# Patient Record
Sex: Female | Born: 1970 | Race: White | Hispanic: No | Smoking: Current every day smoker
Health system: Southern US, Community
[De-identification: ages and names within clinical notes are randomized; demographics above are authoritative.]

## PROBLEM LIST (undated history)

## (undated) DIAGNOSIS — F429 Obsessive-compulsive disorder, unspecified: Secondary | ICD-10-CM

## (undated) DIAGNOSIS — J45909 Unspecified asthma, uncomplicated: Secondary | ICD-10-CM

## (undated) DIAGNOSIS — Z720 Tobacco use: Secondary | ICD-10-CM

## (undated) DIAGNOSIS — M797 Fibromyalgia: Secondary | ICD-10-CM

## (undated) DIAGNOSIS — G4710 Hypersomnia, unspecified: Secondary | ICD-10-CM

## (undated) DIAGNOSIS — G4730 Sleep apnea, unspecified: Secondary | ICD-10-CM

## (undated) DIAGNOSIS — M810 Age-related osteoporosis without current pathological fracture: Secondary | ICD-10-CM

## (undated) DIAGNOSIS — F419 Anxiety disorder, unspecified: Secondary | ICD-10-CM

## (undated) DIAGNOSIS — J449 Chronic obstructive pulmonary disease, unspecified: Secondary | ICD-10-CM

## (undated) DIAGNOSIS — E669 Obesity, unspecified: Secondary | ICD-10-CM

## (undated) DIAGNOSIS — D510 Vitamin B12 deficiency anemia due to intrinsic factor deficiency: Secondary | ICD-10-CM

## (undated) DIAGNOSIS — K760 Fatty (change of) liver, not elsewhere classified: Secondary | ICD-10-CM

## (undated) DIAGNOSIS — I10 Essential (primary) hypertension: Secondary | ICD-10-CM

## (undated) DIAGNOSIS — F32A Depression, unspecified: Secondary | ICD-10-CM

## (undated) DIAGNOSIS — E785 Hyperlipidemia, unspecified: Secondary | ICD-10-CM

## (undated) HISTORY — DX: Chronic obstructive pulmonary disease, unspecified: J44.9

## (undated) HISTORY — DX: Fatty (change of) liver, not elsewhere classified: K76.0

## (undated) HISTORY — DX: Obsessive-compulsive disorder, unspecified: F42.9

## (undated) HISTORY — DX: Tobacco use: Z72.0

## (undated) HISTORY — DX: Sleep apnea, unspecified: G47.30

## (undated) HISTORY — DX: Obesity, unspecified: E66.9

## (undated) HISTORY — DX: Essential (primary) hypertension: I10

## (undated) HISTORY — PX: KNEE SURGERY: SHX244

## (undated) HISTORY — PX: AUGMENTATION MAMMAPLASTY: SUR837

## (undated) HISTORY — DX: Anxiety disorder, unspecified: F41.9

## (undated) HISTORY — DX: Unspecified asthma, uncomplicated: J45.909

## (undated) HISTORY — DX: Hypersomnia, unspecified: G47.10

## (undated) HISTORY — PX: ABDOMINAL HYSTERECTOMY: SHX81

## (undated) HISTORY — DX: Age-related osteoporosis without current pathological fracture: M81.0

## (undated) HISTORY — PX: FOOT SURGERY: SHX648

## (undated) HISTORY — DX: Depression, unspecified: F32.A

## (undated) HISTORY — DX: Hyperlipidemia, unspecified: E78.5

## (undated) HISTORY — PX: NASAL SEPTUM SURGERY: SHX37

## (undated) HISTORY — DX: Fibromyalgia: M79.7

---

## 1999-04-04 ENCOUNTER — Other Ambulatory Visit: Admission: RE | Admit: 1999-04-04 | Discharge: 1999-04-04 | Payer: Self-pay | Admitting: Obstetrics & Gynecology

## 1999-10-22 ENCOUNTER — Inpatient Hospital Stay (HOSPITAL_COMMUNITY): Admission: AD | Admit: 1999-10-22 | Discharge: 1999-10-22 | Payer: Self-pay | Admitting: Obstetrics & Gynecology

## 1999-11-09 ENCOUNTER — Inpatient Hospital Stay (HOSPITAL_COMMUNITY): Admission: AD | Admit: 1999-11-09 | Discharge: 1999-11-09 | Payer: Self-pay | Admitting: Obstetrics and Gynecology

## 1999-11-13 ENCOUNTER — Inpatient Hospital Stay (HOSPITAL_COMMUNITY): Admission: AD | Admit: 1999-11-13 | Discharge: 1999-11-13 | Payer: Self-pay | Admitting: Obstetrics and Gynecology

## 1999-11-17 ENCOUNTER — Inpatient Hospital Stay (HOSPITAL_COMMUNITY): Admission: AD | Admit: 1999-11-17 | Discharge: 1999-11-19 | Payer: Self-pay | Admitting: Obstetrics and Gynecology

## 1999-12-17 ENCOUNTER — Encounter: Admission: RE | Admit: 1999-12-17 | Discharge: 2000-03-16 | Payer: Self-pay | Admitting: Obstetrics and Gynecology

## 2000-04-26 ENCOUNTER — Emergency Department (HOSPITAL_COMMUNITY): Admission: EM | Admit: 2000-04-26 | Discharge: 2000-04-26 | Payer: Self-pay | Admitting: Emergency Medicine

## 2000-04-26 ENCOUNTER — Encounter: Payer: Self-pay | Admitting: Urology

## 2000-11-08 ENCOUNTER — Other Ambulatory Visit: Admission: RE | Admit: 2000-11-08 | Discharge: 2000-11-08 | Payer: Self-pay | Admitting: Obstetrics & Gynecology

## 2001-03-16 HISTORY — PX: BREAST ENHANCEMENT SURGERY: SHX7

## 2001-12-12 ENCOUNTER — Other Ambulatory Visit: Admission: RE | Admit: 2001-12-12 | Discharge: 2001-12-12 | Payer: Self-pay | Admitting: Obstetrics and Gynecology

## 2002-07-14 ENCOUNTER — Encounter: Payer: Self-pay | Admitting: Specialist

## 2002-07-14 ENCOUNTER — Ambulatory Visit (HOSPITAL_COMMUNITY): Admission: RE | Admit: 2002-07-14 | Discharge: 2002-07-14 | Payer: Self-pay | Admitting: *Deleted

## 2003-02-19 ENCOUNTER — Other Ambulatory Visit: Admission: RE | Admit: 2003-02-19 | Discharge: 2003-02-19 | Payer: Self-pay | Admitting: Obstetrics and Gynecology

## 2004-08-06 ENCOUNTER — Ambulatory Visit: Payer: Self-pay | Admitting: Family Medicine

## 2004-09-29 ENCOUNTER — Other Ambulatory Visit: Admission: RE | Admit: 2004-09-29 | Discharge: 2004-09-29 | Payer: Self-pay | Admitting: Obstetrics and Gynecology

## 2005-02-02 ENCOUNTER — Encounter (INDEPENDENT_AMBULATORY_CARE_PROVIDER_SITE_OTHER): Payer: Self-pay | Admitting: Specialist

## 2005-02-02 ENCOUNTER — Ambulatory Visit (HOSPITAL_COMMUNITY): Admission: RE | Admit: 2005-02-02 | Discharge: 2005-02-02 | Payer: Self-pay | Admitting: Obstetrics and Gynecology

## 2005-08-07 ENCOUNTER — Encounter (INDEPENDENT_AMBULATORY_CARE_PROVIDER_SITE_OTHER): Payer: Self-pay | Admitting: Specialist

## 2005-08-07 ENCOUNTER — Ambulatory Visit (HOSPITAL_COMMUNITY): Admission: RE | Admit: 2005-08-07 | Discharge: 2005-08-07 | Payer: Self-pay | Admitting: Obstetrics and Gynecology

## 2005-08-25 ENCOUNTER — Ambulatory Visit: Payer: Self-pay | Admitting: Family Medicine

## 2006-03-16 HISTORY — PX: OTHER SURGICAL HISTORY: SHX169

## 2006-05-20 ENCOUNTER — Ambulatory Visit: Payer: Self-pay | Admitting: Family Medicine

## 2006-07-26 DIAGNOSIS — F429 Obsessive-compulsive disorder, unspecified: Secondary | ICD-10-CM | POA: Insufficient documentation

## 2006-07-26 DIAGNOSIS — IMO0001 Reserved for inherently not codable concepts without codable children: Secondary | ICD-10-CM | POA: Insufficient documentation

## 2006-08-03 ENCOUNTER — Encounter: Payer: Self-pay | Admitting: Family Medicine

## 2006-08-04 ENCOUNTER — Ambulatory Visit: Payer: Self-pay | Admitting: Family Medicine

## 2006-08-04 DIAGNOSIS — F17200 Nicotine dependence, unspecified, uncomplicated: Secondary | ICD-10-CM | POA: Insufficient documentation

## 2006-08-04 DIAGNOSIS — I10 Essential (primary) hypertension: Secondary | ICD-10-CM | POA: Insufficient documentation

## 2006-08-18 ENCOUNTER — Encounter: Payer: Self-pay | Admitting: Obstetrics & Gynecology

## 2006-08-18 ENCOUNTER — Ambulatory Visit: Payer: Self-pay | Admitting: Obstetrics & Gynecology

## 2006-08-20 ENCOUNTER — Telehealth (INDEPENDENT_AMBULATORY_CARE_PROVIDER_SITE_OTHER): Payer: Self-pay | Admitting: *Deleted

## 2006-08-20 DIAGNOSIS — G4733 Obstructive sleep apnea (adult) (pediatric): Secondary | ICD-10-CM | POA: Insufficient documentation

## 2006-08-24 ENCOUNTER — Ambulatory Visit: Payer: Self-pay | Admitting: Family Medicine

## 2006-09-29 ENCOUNTER — Encounter: Payer: Self-pay | Admitting: Family Medicine

## 2006-11-02 ENCOUNTER — Telehealth (INDEPENDENT_AMBULATORY_CARE_PROVIDER_SITE_OTHER): Payer: Self-pay | Admitting: *Deleted

## 2006-11-10 ENCOUNTER — Ambulatory Visit: Payer: Self-pay | Admitting: Family Medicine

## 2006-11-10 DIAGNOSIS — N924 Excessive bleeding in the premenopausal period: Secondary | ICD-10-CM | POA: Insufficient documentation

## 2006-11-10 DIAGNOSIS — R51 Headache: Secondary | ICD-10-CM

## 2006-11-10 DIAGNOSIS — R519 Headache, unspecified: Secondary | ICD-10-CM | POA: Insufficient documentation

## 2006-12-10 ENCOUNTER — Encounter: Payer: Self-pay | Admitting: Family Medicine

## 2006-12-14 ENCOUNTER — Encounter: Payer: Self-pay | Admitting: Family Medicine

## 2006-12-24 ENCOUNTER — Encounter: Payer: Self-pay | Admitting: Family Medicine

## 2007-05-21 ENCOUNTER — Encounter: Admission: RE | Admit: 2007-05-21 | Discharge: 2007-05-21 | Payer: Self-pay | Admitting: Neurology

## 2007-06-08 ENCOUNTER — Encounter: Payer: Self-pay | Admitting: Family Medicine

## 2007-07-19 ENCOUNTER — Ambulatory Visit: Payer: Self-pay | Admitting: Family Medicine

## 2007-07-26 ENCOUNTER — Ambulatory Visit (HOSPITAL_COMMUNITY): Admission: RE | Admit: 2007-07-26 | Discharge: 2007-07-26 | Payer: Self-pay | Admitting: Gynecology

## 2007-07-29 ENCOUNTER — Ambulatory Visit: Payer: Self-pay | Admitting: Family Medicine

## 2007-08-10 ENCOUNTER — Ambulatory Visit: Payer: Self-pay | Admitting: Family Medicine

## 2007-08-23 ENCOUNTER — Ambulatory Visit: Payer: Self-pay | Admitting: Internal Medicine

## 2007-08-25 ENCOUNTER — Ambulatory Visit (HOSPITAL_BASED_OUTPATIENT_CLINIC_OR_DEPARTMENT_OTHER): Admission: RE | Admit: 2007-08-25 | Discharge: 2007-08-25 | Payer: Self-pay | Admitting: Internal Medicine

## 2007-08-25 ENCOUNTER — Encounter: Payer: Self-pay | Admitting: Internal Medicine

## 2007-09-01 ENCOUNTER — Telehealth: Payer: Self-pay | Admitting: Internal Medicine

## 2007-09-03 ENCOUNTER — Ambulatory Visit: Payer: Self-pay | Admitting: Internal Medicine

## 2007-09-06 ENCOUNTER — Ambulatory Visit: Payer: Self-pay | Admitting: Family Medicine

## 2007-09-06 ENCOUNTER — Encounter: Payer: Self-pay | Admitting: Family Medicine

## 2007-09-09 ENCOUNTER — Ambulatory Visit: Payer: Self-pay | Admitting: Internal Medicine

## 2007-09-15 ENCOUNTER — Telehealth (INDEPENDENT_AMBULATORY_CARE_PROVIDER_SITE_OTHER): Payer: Self-pay | Admitting: *Deleted

## 2007-09-20 ENCOUNTER — Ambulatory Visit (HOSPITAL_COMMUNITY): Admission: RE | Admit: 2007-09-20 | Discharge: 2007-09-20 | Payer: Self-pay | Admitting: Gynecology

## 2007-09-22 ENCOUNTER — Telehealth: Payer: Self-pay | Admitting: Internal Medicine

## 2007-09-26 ENCOUNTER — Ambulatory Visit: Payer: Self-pay | Admitting: Internal Medicine

## 2007-09-26 DIAGNOSIS — G4710 Hypersomnia, unspecified: Secondary | ICD-10-CM | POA: Insufficient documentation

## 2007-09-30 ENCOUNTER — Encounter: Payer: Self-pay | Admitting: Family Medicine

## 2007-10-03 ENCOUNTER — Ambulatory Visit (HOSPITAL_COMMUNITY): Admission: RE | Admit: 2007-10-03 | Discharge: 2007-10-04 | Payer: Self-pay | Admitting: Family Medicine

## 2007-10-03 ENCOUNTER — Encounter: Payer: Self-pay | Admitting: Family Medicine

## 2007-10-03 ENCOUNTER — Ambulatory Visit: Payer: Self-pay | Admitting: Family Medicine

## 2007-10-17 ENCOUNTER — Ambulatory Visit: Payer: Self-pay | Admitting: Family Medicine

## 2007-10-26 ENCOUNTER — Ambulatory Visit: Payer: Self-pay | Admitting: Internal Medicine

## 2007-11-02 ENCOUNTER — Encounter: Payer: Self-pay | Admitting: Internal Medicine

## 2007-12-09 ENCOUNTER — Telehealth: Payer: Self-pay | Admitting: Family Medicine

## 2007-12-28 ENCOUNTER — Telehealth: Payer: Self-pay | Admitting: Internal Medicine

## 2008-01-04 ENCOUNTER — Ambulatory Visit: Payer: Self-pay | Admitting: Family Medicine

## 2008-01-05 ENCOUNTER — Encounter: Payer: Self-pay | Admitting: Family Medicine

## 2008-01-18 ENCOUNTER — Encounter: Payer: Self-pay | Admitting: Family Medicine

## 2008-02-06 ENCOUNTER — Telehealth: Payer: Self-pay | Admitting: Family Medicine

## 2008-03-30 ENCOUNTER — Telehealth: Payer: Self-pay | Admitting: Internal Medicine

## 2008-05-10 ENCOUNTER — Ambulatory Visit: Payer: Self-pay | Admitting: Internal Medicine

## 2008-05-10 DIAGNOSIS — J310 Chronic rhinitis: Secondary | ICD-10-CM | POA: Insufficient documentation

## 2008-05-21 ENCOUNTER — Telehealth (INDEPENDENT_AMBULATORY_CARE_PROVIDER_SITE_OTHER): Payer: Self-pay | Admitting: *Deleted

## 2008-05-23 ENCOUNTER — Telehealth: Payer: Self-pay | Admitting: Internal Medicine

## 2008-06-01 ENCOUNTER — Telehealth: Payer: Self-pay | Admitting: Family Medicine

## 2008-06-01 ENCOUNTER — Encounter: Payer: Self-pay | Admitting: Family Medicine

## 2008-07-06 ENCOUNTER — Telehealth: Payer: Self-pay | Admitting: Internal Medicine

## 2008-08-09 ENCOUNTER — Telehealth: Payer: Self-pay | Admitting: Internal Medicine

## 2008-08-10 ENCOUNTER — Telehealth: Payer: Self-pay | Admitting: Family Medicine

## 2008-08-15 ENCOUNTER — Ambulatory Visit: Payer: Self-pay | Admitting: Family Medicine

## 2008-10-03 ENCOUNTER — Telehealth: Payer: Self-pay | Admitting: Family Medicine

## 2008-10-03 ENCOUNTER — Encounter: Payer: Self-pay | Admitting: Family Medicine

## 2008-10-08 ENCOUNTER — Telehealth: Payer: Self-pay | Admitting: Family Medicine

## 2008-10-09 ENCOUNTER — Encounter: Payer: Self-pay | Admitting: Family Medicine

## 2008-11-12 ENCOUNTER — Telehealth: Payer: Self-pay | Admitting: Internal Medicine

## 2009-01-10 ENCOUNTER — Telehealth: Payer: Self-pay | Admitting: Internal Medicine

## 2009-04-05 ENCOUNTER — Telehealth: Payer: Self-pay | Admitting: Internal Medicine

## 2009-06-07 ENCOUNTER — Telehealth: Payer: Self-pay | Admitting: Internal Medicine

## 2009-07-11 ENCOUNTER — Telehealth (INDEPENDENT_AMBULATORY_CARE_PROVIDER_SITE_OTHER): Payer: Self-pay | Admitting: *Deleted

## 2009-08-16 ENCOUNTER — Ambulatory Visit: Payer: Self-pay | Admitting: Internal Medicine

## 2009-08-19 ENCOUNTER — Telehealth (INDEPENDENT_AMBULATORY_CARE_PROVIDER_SITE_OTHER): Payer: Self-pay | Admitting: *Deleted

## 2009-08-28 ENCOUNTER — Encounter: Payer: Self-pay | Admitting: Family Medicine

## 2009-09-09 ENCOUNTER — Telehealth: Payer: Self-pay | Admitting: Family Medicine

## 2009-09-10 ENCOUNTER — Telehealth: Payer: Self-pay | Admitting: Family Medicine

## 2009-09-19 ENCOUNTER — Telehealth (INDEPENDENT_AMBULATORY_CARE_PROVIDER_SITE_OTHER): Payer: Self-pay | Admitting: *Deleted

## 2009-10-08 ENCOUNTER — Encounter: Payer: Self-pay | Admitting: Family Medicine

## 2009-10-09 ENCOUNTER — Ambulatory Visit: Payer: Self-pay | Admitting: Family Medicine

## 2009-10-09 DIAGNOSIS — R7303 Prediabetes: Secondary | ICD-10-CM | POA: Insufficient documentation

## 2009-10-09 DIAGNOSIS — E785 Hyperlipidemia, unspecified: Secondary | ICD-10-CM | POA: Insufficient documentation

## 2009-10-09 DIAGNOSIS — E538 Deficiency of other specified B group vitamins: Secondary | ICD-10-CM | POA: Insufficient documentation

## 2009-11-01 ENCOUNTER — Telehealth (INDEPENDENT_AMBULATORY_CARE_PROVIDER_SITE_OTHER): Payer: Self-pay | Admitting: *Deleted

## 2009-12-11 ENCOUNTER — Telehealth (INDEPENDENT_AMBULATORY_CARE_PROVIDER_SITE_OTHER): Payer: Self-pay | Admitting: *Deleted

## 2010-01-09 ENCOUNTER — Telehealth (INDEPENDENT_AMBULATORY_CARE_PROVIDER_SITE_OTHER): Payer: Self-pay | Admitting: *Deleted

## 2010-01-16 ENCOUNTER — Telehealth: Payer: Self-pay | Admitting: Internal Medicine

## 2010-01-21 ENCOUNTER — Telehealth (INDEPENDENT_AMBULATORY_CARE_PROVIDER_SITE_OTHER): Payer: Self-pay | Admitting: *Deleted

## 2010-03-06 ENCOUNTER — Telehealth (INDEPENDENT_AMBULATORY_CARE_PROVIDER_SITE_OTHER): Payer: Self-pay | Admitting: *Deleted

## 2010-04-15 NOTE — Progress Notes (Signed)
Summary: prescript  Phone Note Call from Patient Call back at [phone]   Caller: Patient Call For: young Summary of Call: need prescript for generic adderral and ritalin pt will pick up when ready. Initial call taken by: Rickard Patience,  November 01, 2009 10:22 AM  Follow-up for Phone Call        [facility] need to know if pt has enough to get through to Monday when CY returns. Zackery Barefoot CMA  November 01, 2009 10:26 AM    LMTCB.Reynaldo Minium CMA  November 01, 2009 4:26 PM   Additional Follow-up for Phone Call Additional follow up Details #1::        rxs placed on Dr Roxy Cedar cart to be signed Vernie Murders  November 04, 2009 5:14 PM    LMOVM that RX is ready for pick up.Reynaldo Minium CMA  November 05, 2009 12:13 PM     Prescriptions: METHYLPHENIDATE HCL 10 MG TABS (METHYLPHENIDATE HCL) 2 daily as needed  #60 x 0   Entered by:   Vernie Murders   Authorized by:   Waymon Budge MD   Signed by:   Vernie Murders on 11/04/2009   Method used:   Print then Give to Patient   RxID:   [id number] AMPHETAMINE-DEXTROAMPHETAMINE 30 MG XR24H-CAP (AMPHETAMINE-DEXTROAMPHETAMINE) 1 daily as needed  #30 x 0   Entered by:   Vernie Murders   Authorized by:   Waymon Budge MD   Signed by:   Vernie Murders on 11/04/2009   Method used:   Print then Give to Patient   RxID:   [id number]

## 2010-04-15 NOTE — Progress Notes (Signed)
Summary: refills  Phone Note Call from Patient Call back at [phone]   Caller: Patient Call For: young Reason for Call: Refill Medication Summary of Call: Need written rxs for amphetamine-dextroamphetamine 30mg , methylphenidate hcl 10mg . Initial call taken by: Darletta Moll,  September 19, 2009 2:33 PM  Follow-up for Phone Call        Rxs printed and placed on CY's cart to sign.  Will forward message to him as FYI.  Gweneth Dimitri RN  September 19, 2009 2:38 PM   Additional Follow-up for Phone Call Additional follow up Details #1::        Done Additional Follow-up by: Waymon Budge MD,  September 19, 2009 5:46 PM    Additional Follow-up for Phone Call Additional follow up Details #2::    Pt aware RX's ready and will pick up this afternoon.Reynaldo Minium CMA  September 20, 2009 9:40 AM   Prescriptions: METHYLPHENIDATE HCL 10 MG TABS (METHYLPHENIDATE HCL) 2 daily as needed  #60 x 0   Entered by:   Gweneth Dimitri RN   Authorized by:   Waymon Budge MD   Signed by:   Gweneth Dimitri RN on 09/19/2009   Method used:   Print then Give to Patient   RxID:   [id number] AMPHETAMINE-DEXTROAMPHETAMINE 30 MG XR24H-CAP (AMPHETAMINE-DEXTROAMPHETAMINE) 1 daily as needed  #31 x 0   Entered by:   Gweneth Dimitri RN   Authorized by:   Waymon Budge MD   Signed by:   Gweneth Dimitri RN on 09/19/2009   Method used:   Print then Give to Patient   RxID:   [id number]

## 2010-04-15 NOTE — Progress Notes (Signed)
Summary: rx request  Phone Note Call from Patient   Caller: Patient Call For: young Summary of Call: pt requests to pick up rx for adderall. note: she has a f/u appt w/ cy sched for nex tues. [phone] Initial call taken by: Tivis Ringer, CNA,  June 07, 2009 9:24 AM  Follow-up for Phone Call        pt called back to check and see if rx was ready. Follow-up by: Eugene Gavia,  June 07, 2009 3:55 PM  Additional Follow-up for Phone Call Additional follow up Details #1::        rx printed and given to CY to sign. pt advised can pick up later this pm. Carron Curie CMA  June 07, 2009 4:10 PM     Prescriptions: AMPHETAMINE-DEXTROAMPHETAMINE 30 MG XR24H-CAP (AMPHETAMINE-DEXTROAMPHETAMINE) 1 or 2 daily as needed  #60 x 0   Entered by:   Carron Curie CMA   Authorized by:   Waymon Budge MD   Signed by:   Carron Curie CMA on 06/07/2009   Method used:   Print then Give to Patient   RxID:   [id number]

## 2010-04-15 NOTE — Progress Notes (Signed)
Summary: adderall  Phone Note Call from Patient   Caller: Patient Call For: YOUNG Summary of Call: pt requests rx for adderall. she will pick this up when called. [phone] Initial call taken by: Tivis Ringer, CNA,  July 11, 2009 2:41 PM  Follow-up for Phone Call        Pt has appt sched with Dr Maple Hudson for 08/16/09.  Rx printed and placed in lookat  Follow-up by: Vernie Murders,  July 11, 2009 2:46 PM  Additional Follow-up for Phone Call Additional follow up Details #1::        Rx signed and placed at front for pick up; Left message at pts number that it was ready to pick up.Reynaldo Minium CMA  July 11, 2009 3:13 PM     Prescriptions: AMPHETAMINE-DEXTROAMPHETAMINE 30 MG XR24H-CAP (AMPHETAMINE-DEXTROAMPHETAMINE) 1 or 2 daily as needed  #60 x 0   Entered by:   Vernie Murders   Authorized by:   Waymon Budge MD   Signed by:   Vernie Murders on 07/11/2009   Method used:   Print then Give to Patient   RxID:   [id number]

## 2010-04-15 NOTE — Progress Notes (Signed)
Summary: rx adderall  Phone Note Call from Patient   Caller: Patient Call For: Elizer Bostic Summary of Call: pt requests a rx printed out for adderall. call pt at [phone]. she will then have her friend/ co-worker sheila (from [facility]) pick this up for her. [phone] Initial call taken by: Tivis Ringer,  January 10, 2009 2:46 PM  Follow-up for Phone Call        rx printed and placed on CY look-at cart to sign. Will notify pt once signed. Carron Curie CMA  January 10, 2009 2:58 PM    lmom for pt to make her aware the rx was ready to be picked up.  told her to call for any questions. Marijo File CMA  January 10, 2009 3:36 PM     Prescriptions: AMPHETAMINE-DEXTROAMPHETAMINE 30 MG XR24H-CAP (AMPHETAMINE-DEXTROAMPHETAMINE) 1 or 2 daily as needed  #60 x 0   Entered by:   Carron Curie CMA   Authorized by:   Waymon Budge MD   Signed by:   Carron Curie CMA on 01/10/2009   Method used:   Print then Give to Patient   RxID:   [id number]

## 2010-04-15 NOTE — Progress Notes (Signed)
Summary: rx's / adderall and ritalin -  Phone Note Call from Patient   Caller: Patient Call For: young Summary of Call: pt has questions re: ritalin and adderall. she wants a rx for each with "extended release for both. [phone] Initial call taken by: Tivis Ringer, CNA,  January 16, 2010 11:53 AM  Follow-up for Phone Call        lmomtcb Vernie Murders  January 16, 2010 11:59 AM  [facility] TCB x2. Boone Master CNA/MA  January 17, 2010 10:21 AM    LMOMTCB  Vernie Murders  January 20, 2010 4:31 PM   Additional Follow-up for Phone Call Additional follow up Details #1::        per protocol will sign offon note and await return call. Carron Curie CMA  January 21, 2010 11:31 AM

## 2010-04-15 NOTE — Assessment & Plan Note (Signed)
Summary: Fernand Parkins ///KP   Primary Provider/Referring Provider:  Roxy Manns  CC:  follow up visit.  History of Present Illness: 10/26/07 40 year old woman returning for follow-up of hypersomnia, and history of snoring.  She continues to smoke and refers to her history of OCD.  Hysterectomy 3 weeks ago without problems.  She skipped Nuvigil and Ritalin for two weeks related to anesthesia and surgery, but has started back on those medicines.  Ritalin 20-mg SR b.i.d., plus Nuvigil 250 mg most days.  This has been a good combination.  She does not need naps when she gets home from work, so she can spend time with her son and get her own work done.  She is also pleased with weight loss because of her diabetes.  We discussed these medicines carefully including issues of stimulant medication, weight loss, and mood change. new porblem sinus congestion, pressure drainage.  05/10/08- Snoring, Hypersomnia Still leaves work at [employer], goes home and sleeps until 9 PM, is up for an hour or so until 11PM then goes back to bed. Dr Clarisse Gouge for migraine, sometimes prolonged. Being eval for R/O MS. Uses Nuvigil- she likes it.. Uses Ritalin occasionally- less effective. Says generic definitely less effective than brand name . She tolerates Ritalin and Nuvigil well without recognized concerns.  Discussed social stresses with family. Also takes some Neuronitn and Skelaxin which she understands are sedating.  August 16, 2009- Snoring, Hypersomnia She quit Nuvigil because of cost, but liked it. She uses Adderall once or twice daily- twice if she works a double shift, and thinks she does really well with it. She now sleeps fine as long as she is careful to avoid taking adderall too late. She is satisfied. Denies headache, chest pain, palpitation.    Current Medications (verified): 1)  Neurontin 600 Mg Tabs (Gabapentin) .... Take 1 Tablet By Mouth Two Times A Day For Leg Pain 2)  Cymbalta  Cpep (Duloxetine Hcl Cpep) .... Take 90  Mg. By Mouth Daily 3)  Toprol Xl 100 Mg Xr24h-Tab (Metoprolol Succinate) .Marland Kitchen.. 1 By Mouth Once Daily 4)  Topamax 100 Mg  Tabs (Topiramate) .Marland Kitchen.. 1 At Bedtime 5)  Amphetamine-Dextroamphetamine 30 Mg Xr24h-Cap (Amphetamine-Dextroamphetamine) .Marland Kitchen.. 1 or 2 Daily As Needed 6)  Doxycycline Hyclate 100 Mg Tabs (Doxycycline Hyclate) .... Take 1 Tablet By Mouth Two Times A Day (For Acne) 7)  Amrix 15 Mg Xr24h-Cap (Cyclobenzaprine Hcl) .... 2 By Mouth Every Day 8)  Norco 10-325 Mg Tabs (Hydrocodone-Acetaminophen) .... One By Mouth Every 6 Hours 9)  Zestril 5 Mg Tabs (Lisinopril) .Marland Kitchen.. 1 By Mouth Once Daily in Am  Allergies (verified): 1)  ! Chantix Continuing Month Pak (Varenicline Tartrate) 2)  Cipro 3)  Levaquin  Past History:  Past Medical History: Last updated: 05/10/2008 DM 2 obesity tobacco abuse OCD fibromyalgia ? hyperlipidemia  Hypersomnia- NPSG 08/25/07- 1.2/hr  Past Surgical History: Last updated: 11/10/2006 Foot surgery (2005) Deviated septum surgery foot sx 2008 IUD merina 2008  Family History: Last updated: 08/23/2007 Father: HTN, CAD, chol, OSA with cpap Mother: ? 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    Vital Signs:  Patient profile:   40 year old female Height:      64 inches Weight:      170 pounds BMI:      29.29 Cuff size:   regular  Vitals Entered By: Reynaldo Minium CMA (August 16, 2009 3:58 PM)  O2 Flow:  Room air  Physical Exam  Additional Exam:  General: A/Ox3; pleasant and cooperative, NAD,overweight, affect appropriate, not obviously sleepy SKIN: no rash, lesions NODES: no lymphadenopathy NECK: Supple w/ fair ROM, JVD- none, normal carotid impulses w/o bruits Thyroid- normal to palpation CHEST: Clear to P&A HEART: RRR, no m/g/r heard ABDOMEN:  NWG:NFAO, nl pulses, no edema  NEURO: Grossly intact to observation, no tremor HEENT: [place]/AT, EOM- wnl, Conjunctivae- clear, PERRLA, TMs- wnl, Nose- clear, Throat- clear and wnl       Impression & Recommendations:  Problem # 1:  HYPERSOMNIA (ICD-780.54)  Doing very well. Sleep hygiene is better and she is pleased with her quality of life. Adderall is a big help without associated problems now that she has learned how to use it. We have reviewed the controlled status of adderall and the side effect concerns. She has expressed understanding.  Problem # 2:  RHINITIS (ICD-472.0)  Controlled with no interference with her sleep now.  Medications Added to Medication List This Visit: 1)  Nuvigil 250 Mg Tabs (Armodafinil) .Marland Kitchen.. 1 daily if needed  Other Orders: Est. Patient Level III (13086)  Patient Instructions: 1)  Please schedule a follow-up appointment in 1 year. 2)  Refill script for Nuvigil with discount card so you can check price. Don't take it with adderall. 3)  Refilled Adderall Prescriptions: NUVIGIL 250 MG TABS (ARMODAFINIL) 1 daily if needed  #30 x 5   Entered and Authorized by:   Waymon Budge MD   Signed by:   Waymon Budge MD on 08/16/2009   Method used:   Print then Give to Patient   RxID:   [id number] AMPHETAMINE-DEXTROAMPHETAMINE 30 MG XR24H-CAP (AMPHETAMINE-DEXTROAMPHETAMINE) 1 or 2 daily as needed  #60 x 0   Entered and Authorized by:   Waymon Budge MD   Signed by:   Waymon Budge MD on  08/16/2009   Method used:   Print then Give to Patient   RxID:   [id number]

## 2010-04-15 NOTE — Progress Notes (Signed)
Summary: rx request  Phone Note Call from Patient   Caller: Patient Call For: young Summary of Call: pt requests rx's for adderall and ritalin. will pick up. [phone] Initial call taken by: Tivis Ringer, CNA,  December 11, 2009 11:53 AM  Follow-up for Phone Call        Last OV 6.3.11, Pending OV 6.1.12 Rxs printed and placed on CY's cart for signature Pt will pick up when ready Gweneth Dimitri RN  December 11, 2009 11:57 AM   Additional Follow-up for Phone Call Additional follow up Details #1::        Rx at front desk for pick up.Reynaldo Minium CMA  December 11, 2009 12:31 PM    Pt aware rxs at front for pick up.  Gweneth Dimitri RN  December 11, 2009 12:40 PM     Prescriptions: METHYLPHENIDATE HCL 10 MG TABS (METHYLPHENIDATE HCL) 2 daily as needed  #60 x 0   Entered by:   Gweneth Dimitri RN   Authorized by:   Waymon Budge MD   Signed by:   Gweneth Dimitri RN on 12/11/2009   Method used:   Print then Give to Patient   RxID:   [id number] AMPHETAMINE-DEXTROAMPHETAMINE 30 MG XR24H-CAP (AMPHETAMINE-DEXTROAMPHETAMINE) 1 daily as needed  #30 x 0   Entered by:   Gweneth Dimitri RN   Authorized by:   Waymon Budge MD   Signed by:   Gweneth Dimitri RN on 12/11/2009   Method used:   Print then Give to Patient   RxID:   [id number]

## 2010-04-15 NOTE — Progress Notes (Signed)
Summary: B-12 injections  Phone Note Call from Patient Call back at Cell:  [phone]   Caller: Patient Call For: Judith Part MD Summary of Call: Concerning the B-12 injections from phone note yesterday, Rylee wants to know if this can be phoned in to the pharmacy and she will do her own injections.  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  She says she will come here if she has to but she would have to pay a $25 copay each time.  I told her I didn't know if this was admissable or not.  Please advise. Initial call taken by: Delilah Shan CMA Duncan Dull),  September 10, 2009 11:16 AM  Follow-up for Phone Call        if she is trained to do injections -- make sure of that - is ok  px written on EMR for call in  Follow-up by: Judith Part MD,  September 10, 2009 11:30 AM  Additional Follow-up for Phone Call Additional follow up Details #1::        Left message for patient to call back. Lewanda Rife LPN  September 10, 2009 12:43 PM   Spoke w/ patient. She is trained to do injections IM. Notified of rx.  Melody Comas  September 10, 2009 2:39 PM  Additional Follow-up by: Melody Comas,  September 10, 2009 2:39 PM    New/Updated Medications: CYANOCOBALAMIN 1000 MCG/ML SOLN (CYANOCOBALAMIN) inject 1 ml IM as directed once weekly for 4 weeks Prescriptions: CYANOCOBALAMIN 1000 MCG/ML SOLN (CYANOCOBALAMIN) inject 1 ml IM as directed once weekly for 4 weeks  #50ml x 0   Entered and Authorized by:   Judith Part MD   Signed by:   Lewanda Rife LPN on 16/12/9602   Method used:   Telephoned to ...       [facility]  [place] (retail)       [address]       [place], [place]  [address]       Ph: [phone] or [phone]       Fax: [phone]   RxID:   [id number]

## 2010-04-15 NOTE — Progress Notes (Signed)
Summary: prescript  Phone Note Call from Patient Call back at [phone]   Caller: Patient Call For: Caysie Minnifield Summary of Call: need prescript for adderrall will pick up Initial call taken by: Rickard Patience,  April 05, 2009 3:54 PM  Follow-up for Phone Call        Left message on voicemail at given number that RX is ready to pick up.Reynaldo Minium CMA  April 05, 2009 4:13 PM     Prescriptions: AMPHETAMINE-DEXTROAMPHETAMINE 30 MG XR24H-CAP (AMPHETAMINE-DEXTROAMPHETAMINE) 1 or 2 daily as needed  #60 x 0   Entered by:   Vernie Murders   Authorized by:   Waymon Budge MD   Signed by:   Vernie Murders on 04/05/2009   Method used:   Print then Give to Patient   RxID:   [id number]

## 2010-04-15 NOTE — Assessment & Plan Note (Signed)
Summary: F/U B-12/CLE   Vital Signs:  Patient profile:   40 year old female Height:      64 inches Weight:      164.50 pounds BMI:     28.34 Temp:     99.5 degrees F oral Pulse rate:   92 / minute Pulse rhythm:   regular BP sitting:   136 / 90  (left arm) Cuff size:   regular  Vitals Entered By: Lewanda Rife LPN (October 09, 2009 4:30 PM)  Serial Vital Signs/Assessments:  Time      Position  BP       Pulse  Resp  Temp     By                     138/92                         Judith Part MD  CC: follow-up visit   History of Present Illness: here for f/u of B12 def   when first checked level was below 150 then after 4 weekly B12 shots is up t o570 does not use a huge difference in the way she feels - perhaps a litle imp in concentration   had been taking oral supplementation  gives herself own shots   LDL chol was 172 thinks her diet is very low sat fat  rarely eats a fried food / red meat every 3 months / egg yolks very rarely / does eat a fair amt of mayo/  no shellfish / not a lot of cheese or butter /  high chol runs on mother's side of family    is having hot flashes all the time   no stomach problems or ppis  mother has low B12 - aplastic anemia  neg pariatal cell ab   was taking oral B 12 several weeks and it did not help   had partial hyst - and it went very well   AIC was 4.9     Allergies: 1)  ! Chantix Continuing Month Pak (Varenicline Tartrate) 2)  Cipro 3)  Levaquin  Past History:  Past Medical History: Last updated: 05/10/2008 DM 2 obesity tobacco abuse OCD fibromyalgia ? hyperlipidemia  Hypersomnia- NPSG 08/25/07- 1.2/hr  Past Surgical History: Last updated: 11/10/2006 Foot surgery (2005) Deviated septum surgery foot sx 2008 IUD merina 2008  Family History: Last updated: 08/23/2007 Father: HTN, CAD, chol, OSA with cpap Mother: ? 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 Eyes:  Denies blurring and eye irritation. CV:  Denies chest pain or discomfort, lightheadness, palpitations, and shortness of breath with exertion. Resp:  Denies cough, pleuritic, and wheezing. GI:  Denies abdominal pain, change in bowel habits, and diarrhea. MS:  Denies joint swelling and muscle weakness. Derm:  Denies itching, lesion(s), poor wound healing, and rash. Neuro:  Denies numbness and tingling. Psych:  Denies anxiety and depression. Endo:  Denies cold intolerance, excessive thirst, excessive urination, and heat intolerance. Heme:  Denies abnormal bruising and bleeding.  Physical Exam  General:  overweight but generally well appearing  Head:  normocephalic, atraumatic, and no abnormalities observed.   Eyes:  vision grossly intact, pupils equal, pupils round, and pupils reactive to light.  no conjunctival pallor, injection or icterus  Mouth:  pharynx pink and moist.   Neck:  supple with full rom and no masses or thyromegally, no JVD or carotid bruit  Chest Wall:  No deformities, masses, or tenderness noted. Lungs:  diffusely distant bs , with no crackles or rales scant wheeze on forced exp Heart:  Normal rate and regular rhythm. S1 and S2 normal without gallop, murmur, click, rub or other extra sounds. Abdomen:  Bowel sounds positive,abdomen soft and non-tender without masses, organomegaly or hernias noted. no renal bruits  Msk:  No deformity or scoliosis noted of thoracic or lumbar spine.  no acute joint changes Pulses:  R and L carotid,radial,femoral,dorsalis pedis  and posterior tibial pulses are full and equal bilaterally Extremities:  No clubbing, cyanosis, edema, or deformity noted with normal full range of motion of all joints.   Neurologic:  sensation intact to light touch, gait normal, and DTRs symmetrical and normal.   Skin:  Intact without suspicious lesions or rashes tanned with lentigos  Cervical Nodes:  No lymphadenopathy noted Inguinal Nodes:  No significant adenopathy Psych:  normal affect, talkative and pleasant - seems generally fatigued    Impression & Recommendations:  Problem # 1:  VITAMIN B12 DEFICIENCY (ICD-266.2) Assessment New  with much imp after injections (after failing oral tx)  plan to continue shots monthly  stick to balanced diet  lab 6 mo (at work) and then f/u   Orders: Administrator, arts [phone])  Problem # 2:  HYPERGLYCEMIA (ICD-790.29) Assessment: Improved  this is pretty much resolved with better diet- AIC is 4.9 urged to watch sugar in diet  Orders: Prescription Created Electronically [phone])  Problem # 3:  HYPERTENSION, ESSENTIAL NOS (ICD-401.9) Assessment: Deteriorated  non tolerant of zestril  will try cozaar and keep check of bp  lab reviewed f/u 6 mo  The following medications were removed from the medication list:    Zestril 5 Mg Tabs (Lisinopril) .Marland Kitchen... 1 by mouth once daily in am Her updated medication list for this problem includes:    Toprol Xl 100 Mg Xr24h-tab (Metoprolol succinate) .Marland Kitchen... 1 by mouth once daily    Cozaar 50 Mg Tabs (Losartan potassium) .Marland Kitchen... 1 by mouth once daily  Orders: Prescription Created Electronically [phone])  Problem # 4:  HYPERLIPIDEMIA (ICD-272.4) Assessment: New  with fair diet- LDL in 170s will watch diet closely- and change to non fat mayo  re check 6 mo at work then f/u disc risks of high chol   Orders: Prescription Created Electronically [phone])  Complete Medication List: 1)  Neurontin 600 Mg Tabs (Gabapentin) .... Take 1  tablet by mouth two times a day for leg pain 2)  Cymbalta Cpep (Duloxetine hcl cpep) .... Take 90 mg. by mouth daily 3)  Toprol Xl 100 Mg Xr24h-tab (Metoprolol succinate) .Marland Kitchen.. 1 by mouth once daily 4)  Topamax 100 Mg Tabs (Topiramate) .Marland Kitchen.. 1 at bedtime 5)  Doxycycline Hyclate 100 Mg Tabs (Doxycycline hyclate) .... Take 1 tablet by mouth two times a day (for acne) 6)  Amrix 15 Mg Xr24h-cap (Cyclobenzaprine hcl) .... 2 by mouth every day 7)  Norco 10-325 Mg Tabs (Hydrocodone-acetaminophen) .... One by mouth every 4  hours 8)  Amphetamine-dextroamphetamine 30 Mg Xr24h-cap (Amphetamine-dextroamphetamine) .Marland Kitchen.. 1 daily as needed 9)  Methylphenidate Hcl 10 Mg  Tabs (Methylphenidate hcl) .... 2 daily as needed 10)  Cyanocobalamin 1000 Mcg/ml Soln (Cyanocobalamin) .... Inject 1 ml im as directed once monthly 11)  Cozaar 50 Mg Tabs (Losartan potassium) .Marland Kitchen.. 1 by mouth once daily  Patient Instructions: 1)  stay with vitamin B12 shots once monthly  2)  start cozaar 50 mg daily - instead of the lisinopril 3)  this should not cause cough - but if any side effects at all let me know 4)  switch to non fat mayo  5)  you can raise your HDL (good cholesterol) by increasing exercise and eating omega 3 fatty acid supplement like fish oil or flax seed oil over the counter 6)  you can lower LDL (bad cholesterol) by limiting saturated fats in diet like red meat, fried foods, egg yolks, fatty breakfast meats, high fat dairy products and shellfish  7)  check fasting labs in 6 months B12, lipid/ast/alt - and then follow up  Prescriptions: CYANOCOBALAMIN 1000 MCG/ML SOLN (CYANOCOBALAMIN) inject 1 ml IM as directed once monthly  #3 doses x 3   Entered and Authorized by:   Judith Part MD   Signed by:   Judith Part MD on 10/09/2009   Method used:   Electronically to        [facility] [phone]* (retail)       [address]       [address]       [place], [place]  [address]       Ph: [phone]        Fax: [phone]   RxID:   [id number] COZAAR 50 MG TABS (LOSARTAN POTASSIUM) 1 by mouth once daily  #90 x 3   Entered and Authorized by:   Judith Part MD   Signed by:   Judith Part MD on 10/09/2009   Method used:   Electronically to        [facility] [phone]* (retail)       [address]       [address]       [place], [place]  [address]       Ph: [phone]       Fax: [phone]   RxID:   [id number]   Current Allergies (reviewed today): ! CHANTIX CONTINUING MONTH PAK (VARENICLINE TARTRATE) CIPRO LEVAQUIN

## 2010-04-15 NOTE — Progress Notes (Signed)
Summary: QUESTION REGARD BP MEDS  Phone Note Call from Patient   Summary of Call: PT CALLED...SAYS PHARMACY HAS QUESTIONS REGARDING HER BLOOD PRESSURE MEDICATION.  THEY WILL NOT FILL.Marland Kitchen[facility] KitchenPHARMACY [facility]  CALL BACK # [phone].Daine Gip  February 06, 2008 4:59 PM Initial call taken by: Daine Gip,  February 06, 2008 4:59 PM  Follow-up for Phone Call        spoke with pharmacy pt wants metoprolol changed to er once daily b/c taking it  two times daily makes her sleepy Liane Comber  February 06, 2008 5:07 PM  can switch to ER - may cost more  do not know if it will help sleepiness or not  I will put on EMR for 100 mg ER follow up in about 1 mo for bp check on this  if dizzy or low pulse rate under 60- need to stop it and update me    Follow-up by: Judith Part MD,  February 06, 2008 9:03 PM  Additional Follow-up for Phone Call Additional follow up Details #1::        Left message on answering machine. ...............................................Marland KitchenNatasha Chavers February 07, 2008 10:01 AM     New/Updated Medications: TOPROL XL 100 MG XR24H-TAB (METOPROLOL SUCCINATE) 1 by mouth once daily   Prescriptions: TOPROL XL 100 MG XR24H-TAB (METOPROLOL SUCCINATE) 1 by mouth once daily  #30 x 2   Entered and Authorized by:   Judith Part MD   Signed by:   Liane Comber on 02/07/2008   Method used:   Telephoned to ...       [facility] [address] [facility]ail[facility]       [address]       [place], [place]  [address]       Ph: [phone] or [phone]       Fax: [phone]   RxID:   [id number]

## 2010-04-15 NOTE — Progress Notes (Signed)
Summary: Nuvigil PA---member to contact member svcs - LMTCBx3  Phone Note Outgoing Call   Call placed by: Michel Bickers CMA,  January 09, 2010 9:18 AM Call placed to: Patient Summary of Call: Received PA request from [facility] on [place] for Nuvigil. Pt's ins co. is Monia Pouch [phone]) and they say pt's ins termed out on 12/13/2009 and the patient will need to contact member svcs at [phone]. We cannot do the PA for Nuvigil until this has been taken care of. LMOMTCB.  Member ID # [id number] Initial call taken by: Michel Bickers CMA,  January 09, 2010 9:22 AM  Follow-up for Phone Call        [facility] TCB x1. Boone Master CNA/MA  January 10, 2010 4:32 PM   LMTCBx2.Carron Curie CMA  January 14, 2010 10:17 AM  [facility] @ [phone] TCBx1.  pt insurance termed out as of 9.30.11.  she will need to contact her insurance company in order for PA/coverage for nuvigil. Boone Master CNA/MA  January 16, 2010 9:43 AM

## 2010-04-15 NOTE — Progress Notes (Signed)
Summary: ritalin/ Adderall refill-LMTCbx2  Phone Note Call from Patient   Caller: Patient Call For: young Summary of Call: pt requests to speak to nurse re: last msg 01/16/10- pt is now returning call (ritalin rx) [phone] Initial call taken by: Tivis Ringer, CNA,  January 21, 2010 4:52 PM  Follow-up for Phone Call        Spoke with pt.  She states that she is working 3rd shift now and having a really hard time with staying awake at work. She is requesting that her ritalin be changed to ritalin ER to see if this helps.  Pls advise thanks! Follow-up by: Vernie Murders,  January 21, 2010 4:56 PM  Additional Follow-up for Phone Call Additional follow up Details #1::        If Ritalin worked as a morning med before, I would first want to know if she is taking it now timed appropriately to kick in for her work shift- maybe an hour before starting work. If so, and it helps but is just earing off before shift ends, then it would make sense to try a longer acting product.  Additional Follow-up by: Waymon Budge MD,  January 22, 2010 1:54 PM    Additional Follow-up for Phone Call Additional follow up Details #2::    Pt states she has not had any ritalin or adderall since she has started 3rd shift, so she states she would like to try regular ritalin to see if it helps. She states she is just very tired in the mornigns and falls asleep on her way home. Should pt be taking adderall and ritalin? Please advise.Carron Curie CMA  January 22, 2010 2:43 PM   Additional Follow-up for Phone Call Additional follow up Details #3:: Details for Additional Follow-up Action Taken: I may need to see her much sooner than scheduled. Everybody has more trouble with 3rd shift work.  She has script for adderall 30 mg XR and for methylphenidate (ritalin) regular acting. We will refill those and if they aren't working for her then I would like to see her here to work out options.  Additional Follow-up by:  Waymon Budge MD,  January 23, 2010 12:50 PM  Prescriptions: METHYLPHENIDATE HCL 10 MG TABS (METHYLPHENIDATE HCL) 2 daily as needed  #60 x 0   Entered by:   Waymon Budge MD   Authorized by:   Pulmonary Triage   Signed by:   Waymon Budge MD on 01/23/2010   Method used:   Print then Give to Patient   RxID:   [id number] AMPHETAMINE-DEXTROAMPHETAMINE 30 MG XR24H-CAP (AMPHETAMINE-DEXTROAMPHETAMINE) 1 daily as needed  #30 x 0   Entered by:   Waymon Budge MD   Authorized by:   Pulmonary Triage   Signed by:   Waymon Budge MD on 01/23/2010   Method used:   Print then Give to Patient   RxID:   [id number]  LMOMTCB x1.  RX to be left at front desk for pick up.( RX is now at triage desk ) Abigail Miyamoto RN  January 23, 2010 12:55 PM   LMTCBx2 to see if pt wants rx mailed or will she pick-up? rx is in triage. Carron Curie CMA  January 23, 2010 2:38 PM    Spoke with patient-she is aware of CDY's recs and states she will come by in am to pick up Rx's.Reynaldo Minium CMA  January 23, 2010 5:29 PM

## 2010-04-15 NOTE — Progress Notes (Signed)
Summary: adderall & ritalin  Phone Note Call from Patient Call back at [phone]   Caller: Patient Call For: young Reason for Call: Talk to Nurse Summary of Call: Ins. company giving pt hard time about Adderall two times a day - Will pay for it once per day.  Could she do Adderall once a day and Ritalin once per day.  Would this be ok w/you? Initial call taken by: Eugene Gavia,  August 19, 2009 3:15 PM  Follow-up for Phone Call        Pt states insurance will not pay for adderall twice a day, but pt was told she could do adderall for half the day and then ritalin for half the day and this would be covered. Pt watns to know if this is an option with you. She states they did not specify a dosage.  She states she will need new rx for adderall and ritalin if this is ok with CY. Pt will pick up rx. Please advise. Carron Curie CMA  August 19, 2009 3:21 PM   Additional Follow-up for Phone Call Additional follow up Details #1::        I will change med list to remove Nuvigil, give Adderall only once daily, and Ritalin 10 mg twice daily if needed. Ok to send on request. Additional Follow-up by: Waymon Budge MD,  August 19, 2009 6:12 PM    Additional Follow-up for Phone Call Additional follow up Details #2::    rx's printed and given to cdy to sign, spoke with pt and she does not need rx for adderall xr at this time they took the one she had for a qty of #60 but only filled #30 of them due to ins coverage, so she only needs a new rx for ritalin at this time, I have discarded the adderall rx that was printed the pt was only given the ritalin rx for today--cdy made aware of this--rx's left out front for pick-up Follow-up by: Philipp Deputy CMA,  August 20, 2009 9:44 AM  New/Updated Medications: AMPHETAMINE-DEXTROAMPHETAMINE 30 MG XR24H-CAP (AMPHETAMINE-DEXTROAMPHETAMINE) 1 daily as needed METHYLPHENIDATE HCL 10 MG TABS (METHYLPHENIDATE HCL) 2 daily as needed Prescriptions: METHYLPHENIDATE HCL 10 MG  TABS (METHYLPHENIDATE HCL) 2 daily as needed  #60 x 0   Entered by:   Philipp Deputy CMA   Authorized by:   Waymon Budge MD   Signed by:   Philipp Deputy CMA on 08/20/2009   Method used:   Print then Give to Patient   RxID:   [id number] AMPHETAMINE-DEXTROAMPHETAMINE 30 MG XR24H-CAP (AMPHETAMINE-DEXTROAMPHETAMINE) 1 daily as needed  #31 x 0   Entered by:   Philipp Deputy CMA   Authorized by:   Waymon Budge MD   Signed by:   Philipp Deputy CMA on 08/20/2009   Method used:   Print then Give to Patient   RxID:   [id number] METHYLPHENIDATE HCL 10 MG TABS (METHYLPHENIDATE HCL) 2 daily as needed  #60 x 0   Entered by:   Waymon Budge MD   Authorized by:   Pulmonary Triage   Signed by:   Waymon Budge MD on 08/19/2009   Method used:   Historical   RxID:   [id number] AMPHETAMINE-DEXTROAMPHETAMINE 30 MG XR24H-CAP (AMPHETAMINE-DEXTROAMPHETAMINE) 1 daily as needed  #31 x 0   Entered by:   Waymon Budge MD   Authorized by:   Pulmonary Triage   Signed by:   Waymon Budge MD  on 08/19/2009   Method used:   Historical   RxID:   [id number]

## 2010-04-15 NOTE — Progress Notes (Signed)
Summary: Low B-12  Phone Note Call from Patient Call back at Cell:  [phone]   Caller: Patient Call For: Judith Part MD Summary of Call: Patient says she faxed over some lab results last week.  She says she practically has "no B-12".  She is taking B-12 supplements orally.  Does she need to start injections or come to see you for a visit?  She says she'll be happy to do whatever she needs to do. Initial call taken by: Delilah Shan CMA Duncan Dull),  September 09, 2009 4:19 PM  Follow-up for Phone Call        I would recommend coming in for  B12 shots with nurse visits once weekly for 4 weeks -- then visit with me at 5-6 weeks to check level again and rev all labs  Follow-up by: Judith Part MD,  September 09, 2009 4:54 PM  Additional Follow-up for Phone Call Additional follow up Details #1::        Left message on voicemail  in detail.  Personalized VM.  Additional Follow-up by: Delilah Shan CMA Duncan Dull),  September 09, 2009 5:01 PM

## 2010-04-17 NOTE — Progress Notes (Signed)
Summary: written prescription  for pick up  Phone Note Call from Patient Call back at Home Phone [phone]   Caller: Patient Call For: Dr. Maple Hudson Summary of Call: Patient phoned and would like written prescriptions for both her Adderall and Ritalin. She can be reached at [phone] Initial call taken by: Vedia Coffer,  March 06, 2010 10:12 AM  Follow-up for Phone Call        unsure if she wants these mailed or picked up- ATC pt NA and unable to leave msg.  Will forward to CDY to sign and then try calling her again Vernie Murders  March 06, 2010 1:42 PM   Additional Follow-up for Phone Call Additional follow up Details #1::        OK for refills requested. Additional Follow-up by: Waymon Budge MD,  March 06, 2010 9:10 PM    Additional Follow-up for Phone Call Additional follow up Details #2::    Rxs signed by CY.  ATC both numbers listed above - [facility]  Crystal Yetta Barre RN  March 07, 2010 9:19 AM  LMTCBx2.Carron Curie CMA  March 07, 2010 2:34 PM  called and spoke with pt and she states she will come by and pick them up. will leave upfront for pt. pt is aware of our office hrs and aware it is upfront for pick up Carver Fila  March 11, 2010 3:58 PM    Prescriptions: METHYLPHENIDATE HCL 10 MG TABS (METHYLPHENIDATE HCL) 2 daily as needed  #60 x 0   Entered by:   Vernie Murders   Authorized by:   Waymon Budge MD   Signed by:   Vernie Murders on 03/06/2010   Method used:   Print then Give to Patient   RxID:   [id number] AMPHETAMINE-DEXTROAMPHETAMINE 30 MG XR24H-CAP (AMPHETAMINE-DEXTROAMPHETAMINE) 1 daily as needed  #30 x 0   Entered by:   Vernie Murders   Authorized by:   Waymon Budge MD   Signed by:   Vernie Murders on 03/06/2010   Method used:   Print then Give to Patient   RxID:   [id number]

## 2010-05-12 ENCOUNTER — Telehealth (INDEPENDENT_AMBULATORY_CARE_PROVIDER_SITE_OTHER): Payer: Self-pay | Admitting: *Deleted

## 2010-05-22 NOTE — Progress Notes (Signed)
Summary: needs prescription for adderall and rittelin  Phone Note Call from Patient   Caller: Patient Call For: young Summary of Call: patient phoned stated that she needs her written prescriptions for her adderral and riddelin. Please call her when ready to be picked up. Patient can be reached at [phone] Initial call taken by: Vedia Coffer,  May 12, 2010 2:43 PM  Follow-up for Phone Call        rx have been printed out and placed on CY cart for signature---will call pt once these are signed Randell Loop [facility]  May 12, 2010 2:54 PM   Additional Follow-up for Phone Call Additional follow up Details #1::        Rx at front for pick up.Reynaldo Minium CMA  May 12, 2010 3:19 PM     Additional Follow-up for Phone Call Additional follow up Details #2::    [facility] for pt to be made aware that rxs are ready to be picked up. Follow-up by: Vernie Murders,  May 12, 2010 3:40 PM  Prescriptions: METHYLPHENIDATE HCL 10 MG TABS (METHYLPHENIDATE HCL) 2 daily as needed  #60 x 0   Entered by:   Randell Loop CMA   Authorized by:   Waymon Budge MD   Signed by:   Randell Loop CMA on 05/12/2010   Method used:   Print then Give to Patient   RxID:   [id number] AMPHETAMINE-DEXTROAMPHETAMINE 30 MG XR24H-CAP (AMPHETAMINE-DEXTROAMPHETAMINE) 1 daily as needed  #30 x 0   Entered by:   Randell Loop CMA   Authorized by:   Waymon Budge MD   Signed by:   Randell Loop CMA on 05/12/2010   Method used:   Print then Give to Patient   RxID:   [id number]

## 2010-06-14 ENCOUNTER — Emergency Department (HOSPITAL_BASED_OUTPATIENT_CLINIC_OR_DEPARTMENT_OTHER)
Admission: EM | Admit: 2010-06-14 | Discharge: 2010-06-14 | Disposition: A | Discharge status: Home / Self Care | Payer: 59 | Attending: Emergency Medicine | Admitting: Emergency Medicine

## 2010-06-14 DIAGNOSIS — G35 Multiple sclerosis: Secondary | ICD-10-CM | POA: Insufficient documentation

## 2010-06-14 DIAGNOSIS — J029 Acute pharyngitis, unspecified: Secondary | ICD-10-CM | POA: Insufficient documentation

## 2010-06-14 DIAGNOSIS — F17200 Nicotine dependence, unspecified, uncomplicated: Secondary | ICD-10-CM | POA: Insufficient documentation

## 2010-06-14 LAB — RAPID STREP SCREEN (MED CTR MEBANE ONLY): Streptococcus, Group A Screen (Direct): NEGATIVE

## 2010-07-29 NOTE — Assessment & Plan Note (Signed)
NAMEAINSLIE, MAZUREK              ACCOUNT NO.:  [id number]   MEDICAL RECORD NO.:  [id number]          PATIENT TYPE:  POB   LOCATION:  [facility]         FACILITY:  [facility]   PHYSICIAN:  Tinnie Gens, MD        DATE OF BIRTH:  April 10, 1970   DATE OF SERVICE:  10/17/2007                                  CLINIC NOTE   CHIEF COMPLAINT:  Postop check.   HISTORY OF PRESENT ILLNESS:  The patient is a 40 year old gravida 7,  para 2-0-5-2 who comes back after a TVH for menorrhagia and pelvic pain.  Her Pap was reviewed and is normal.  No tenderness.  No fevers.  No  chills.  She does have some bloody discharge when she wipes.   PHYSICAL EXAMINATION:  VITAL SIGNS:  As noted in the chart.  GENERAL:  She is a well-developed, well-nourished female in no acute  distress.  ABDOMEN:  Soft, nontender, and nondistended.  GU:  Vaginal cuff is clean.  Edges of the suture are felt.  There is no  pelvic mass noted.   IMPRESSION:  Status post total vaginal hysterectomy.   PLAN:  Return to work, should have no prolonged standing or heavy  lifting for at least 2 weeks.  We will follow up in 4 more weeks for  final postop check.           ______________________________  Tinnie Gens, MD     TP/MEDQ  D:  10/17/2007  T:  10/18/2007  Job:  [id number]

## 2010-07-29 NOTE — Discharge Summary (Signed)
NAMEJESSIC, STANDIFER              ACCOUNT NO.:  [id number]   MEDICAL RECORD NO.:  [id number]          PATIENT TYPE:  OIB   LOCATION:  9307                          FACILITY:  [facility]   PHYSICIAN:  Tanya S. Shawnie Pons, M.D.   DATE OF BIRTH:  1970-10-11   DATE OF ADMISSION:  10/03/2007  DATE OF DISCHARGE:  10/04/2007                               DISCHARGE SUMMARY   FINAL DIAGNOSES:  1. Menorrhagia.  2. Intrauterine contraceptive device.  3. Migraine headaches.  4. Fibromyalgia.  5. Narcolepsy.  6. Tobacco abuse.   PROCEDURES:  Total vaginal hysterectomy.   PERTINENT LABS:  Preoperative hemoglobin 14.1 and postoperative  hemoglobin 12.2.  Blood type O positive.  Antibody screen is negative.   REASON FOR ADMISSION:  Briefly, please see history and physical on the  chart.  The patient is a 40 year old, gravida 7, para 2-0-5-2 who has  menorrhagia and has not responded to an IUD.  The patient is not a  candidate for OCs secondary to age and smoking status who desires  permanent treatment.   HOSPITAL COURSE:  The patient was admitted and underwent the above  procedure.  Postoperatively, she was transferred to the floor.  A  vaginal pack and Foley catheter was inserted in the operating room and  these were discontinued on postoperative day #1.  She remained afebrile  during her hospital stay.  She was ambulating without difficulty.  Her  pain was well controlled.  She was tolerating p.o. without nausea or  vomiting.  She was voiding without difficulty.  Given all of these, it  was felt the patient was stable for discharge.   DISCHARGE DISPOSITION AND CONDITION:  The patient was discharged home in  good condition.   FOLLOWUP:  Follow up will be at the [facility] October 17, 2007,  at 3:30 p.m.   DISCHARGE MEDICATIONS:  1. Percocet 5/325 one to two p.o. q.4-6 hours p.r.n. pain.      Additionally, she will continue her home medications with exception      of Vicodin, as she is not  allowed to take with Percocet.  The      discharge medications include:  2. Klonopin at bedtime.  3. Flexeril 10 mg at bedtime.  4. Reglan 20 mg twice daily.  5. Nuvigil 250 mg daily.  6. Neurontin 300 mg four tablets 3 times daily.  7. Skelaxin 800 mg three times daily.  8. Toprol 100  mg at bedtime.  9. Cymbalta 90 mg at bedtime.  10.Topamax 100 mg at bedtime.   The patient is also instructed to return with fever, persistent nausea,  vomiting, or significant abdominal pain.      Shelbie Proctor. Shawnie Pons, M.D.  Electronically Signed     TSP/MEDQ  D:  10/04/2007  T:  10/04/2007  Job:  [id number]

## 2010-07-29 NOTE — Assessment & Plan Note (Signed)
NAMEABBEY, VEITH              ACCOUNT NO.:  [id number]   MEDICAL RECORD NO.:  [id number]          PATIENT TYPE:  POB   LOCATION:  [facility] at [facility]         FACILITY:  [facility]   PHYSICIAN:  Tinnie Gens, MD        DATE OF BIRTH:  03-08-1971   DATE OF SERVICE:                                  CLINIC NOTE   CHIEF COMPLAINT:  Mirena insertion.   HISTORY OF PRESENT ILLNESS:  The patient is a 40 year old gravida 7,  para 2-0-5-2 who comes in today for IUD insertion.  The patient has had  several miscarriages recently and desires to never be pregnant again.  She started her period today and desires IUD insertion.  She has already  been counseled regarding this and is here today just for insertion.   PHYSICAL EXAMINATION:  On exam her vitals are as in the chart.  She is a  well-nourished female in no acute distress.  GU:  Normal external female  genitalia, BUS normal.  Vagina is pink and irrigated.  Cervix is parous  without lesions.  Uterus is anteverted approximately 6-8 weeks.  No  adnexal mass or tenderness procedure. The cervix is visualized, grasped  anteriorly with a single-tooth tenaculum, sounded to 8 cm Mirena IUD is  inserted without difficulty.  The patient tolerated the procedure well.   IMPRESSION:  Undesired fertility status post Mirena IUD insertion.   PLAN:  Follow up 2 weeks for IUD string check. The strings have  previously been trimmed to approximately to 1/2 cm.           ______________________________  Tinnie Gens, MD     TP/MEDQ  D:  08/24/2006  T:  08/25/2006  Job:  [id number]

## 2010-07-29 NOTE — Procedures (Signed)
NAMESOLA, MARGOLIS              ACCOUNT NO.:  [id number]   MEDICAL RECORD NO.:  [id number]          PATIENT TYPE:  OUT   LOCATION:  SLEEP CENTER                 FACILITY:  [facility]   PHYSICIAN:  Clinton D. Maple Hudson, MD, FCCP, FACPDATE OF BIRTH:  20-Apr-1970   DATE OF STUDY:  08/25/2007                            NOCTURNAL POLYSOMNOGRAM   REFERRING PHYSICIAN:  Clinton D. Young, MD, FCCP, FACP   INDICATION FOR STUDY:  Insomnia with sleep apnea.   EPWORTH SLEEPINESS SCORE:  12/24.  BMI 31.6.  Weight 190 pounds.  Height  65 inches.  Neck 16 inches.   MEDICATIONS:  Home medication charted and reviewed.   SLEEP ARCHITECTURE:  Total sleep time 408 minutes with sleep efficiency  95.4%.  Stage I was 3.9%, stage II 76.2%, stage III 19.9%, REM absent.  Sleep latency 5 minutes.  Awake after sleep onset 14 minutes.  Arousal  index 4.1.  Topamax was taken at bedtime.   RESPIRATORY DATA:  Apnea-hypopnea index (AHI) 1.2 per hour, which is  normal.  A total of 8 events were scored, including 2 obstructive apneas  and 6 hypopneas.  Events were mostly associated with non-supine sleep.  There were insufficient events to qualify for CPAP titration by split  protocol on this study night.   OXYGEN DATA:  Moderate snoring with oxygen desaturation to a nadir of  80%.  Mean oxygen saturation through the study was 92.5% on room air.   CARDIAC DATA:  Normal sinus rhythm.   MOVEMENT-PARASOMNIA:  No significant movement disturbance.  No restroom  trips.   IMPRESSIONS-RECOMMENDATIONS:  1. Sleep architecture remarkable for sustained intervals without sleep      stage change and for diminished rapid eye movement, which may be      medication effects.  She commented that chronic leg and hip pain,      especially at night while lying down, affect sleep qualify and      comfort.  2. Occasional respiratory events, insignificant, apnea-hypopnea index      1.2 per hour (normal range 0-5 per hour).  Moderate snoring  with      oxygen desaturation to a nadir of 80%.      Clinton D. Maple Hudson, MD, [facility], FACP  Diplomate, Biomedical engineer of Sleep Medicine  Electronically Signed     CDY/MEDQ  D:  09/03/2007 08:23:26  T:  09/03/2007 08:43:50  Job:  [id number]

## 2010-07-29 NOTE — Assessment & Plan Note (Signed)
NAMEBENIGNA, Desiree Walters              ACCOUNT NO.:  [id number]   MEDICAL RECORD NO.:  [id number]          PATIENT TYPE:  POB   LOCATION:  [facility] at [facility]         FACILITY:  [facility]   PHYSICIAN:  Tinnie Gens, MD        DATE OF BIRTH:  1970/10/16   DATE OF SERVICE:  08/10/2007                                  CLINIC NOTE   CHIEF COMPLAINT:  Hysterectomy consult.   HISTORY OF PRESENT ILLNESS:  Ms. Murtaugh is a 40 year old gravida 7,  para 2-0-5-2, who comes in today for questionable hysterectomy consult.  Apparently, she had an MRI that showed fibroid.  So, she underwent a  pelvic sonogram, which showed a 7-mm endometrial stripe and a 1-cm  fibroid that was at the fundus consistent with the MRI.  She also had a  5-cm complex right ovarian cyst that requires followup.  The patient is  interested in hysterectomy.  She continues to have heavy bleeding  despite IUD insertion.  Although, she is not really willing to miss  work, wants to maybe have a vaginal hysterectomy, back at work in 10  days.  I am not sure if this is feasible for the patient.  A lengthy  discussion was had with her regarding evaluation of ovaries,  preservation of ovaries, degree of hospitalization, length of time out  of work from major surgery including hysterectomy, if it is vaginal.  The patient has no history of previous C-sections, so vaginal  hysterectomy  would be easy including right ovarian oophorectomy, if  necessary.  The patient is over 35, has not had an endometrial sampling  done, and needs a Pap smear as well.  She is unwilling to have this done  today, so we will follow up with those two procedures prior to  scheduling a hysterectomy.  We will also do a followup ultrasound in  about 4 weeks to reevaluate her right ovary.           ______________________________  Tinnie Gens, MD     TP/MEDQ  D:  08/10/2007  T:  08/11/2007  Job:  [id number]

## 2010-07-29 NOTE — Op Note (Signed)
NAMELUPITA, ROSALES              ACCOUNT NO.:  [id number]   MEDICAL RECORD NO.:  [id number]          PATIENT TYPE:  OIB   LOCATION:  9307                          FACILITY:  [facility]   PHYSICIAN:  Tanya S. Shawnie Pons, M.D.   DATE OF BIRTH:  [birth year]   DATE OF PROCEDURE:  10/03/2007  DATE OF DISCHARGE:                               OPERATIVE REPORT   PREOPERATIVE DIAGNOSIS:  Menorrhagia.   POSTOPERATIVE DIAGNOSIS:  Menorrhagia.   PROCEDURE:  Total vaginal hysterectomy.   SURGEON:  Shelbie Proctor. Shawnie Pons, M.D.   ASSISTANT:  Ginger Carne, MD.   ANESTHESIA:  General local, Belva Agee, M.D.   FINDINGS:  Normal ovaries and tubes.  Normal appearing uterus and  cervix.   SPECIMENS:  Uterus and cervix to pathology.   ESTIMATED BLOOD LOSS:  950 mL.   COMPLICATIONS:  None .   INDICATIONS FOR PROCEDURE:  Briefly, the patient is 40 year old gravida  7, para 2-0-5-2 who has had menorrhagia that was initially treated with  a Mirena IUD; however, the patient continued to have heavy bleeding  despite her Mirena and had 1 cm fibroid that she felt was causing her  some pain.  She additionally had a 5-cm complex right ovarian cyst, Jul 26, 2007, that had gone down to 2.1 cm, was thought to be a hemorrhagic  cyst at last ultrasound on September 20, 2007, but she would like her ovaries  looked at too and definitive treatment.   PROCEDURE:  The patient was taken to the OR.  She was placed in dorsal  lithotomy in [place] stirrups.  She was prepped and draped in usual  sterile fashion.  A weighted speculum was placed at the vagina, Deaver  was used, anteriorly cervix was grasped with two double toothed  tenaculum anteriorly and posteriorly and injected 10 mL of 1% lidocaine  with epinephrine.  A circumferential incision was made about the cervix.  Blunt dissection was used to take the vagina down off the cervix.  The  anterior peritoneal cavity was entered sharply and then used Deaver  retractor placed  inside this opening.  Similarly, the posterior  peritoneal cavity was entered sharply.  The peritoneum was tagged to the  vagina and a long weighted speculum was placed inside here.  The  uterosacral ligaments were then grasped bilaterally with a Heaney clamp,  cut, and suture ligated with a 2-0 Vicryl suture in a Heaney type  fashion.  These were held.  The uterine arteries were then sequentially  clamped, cut, and suture ligated as well.  At the end of this, it was  felt that tubo-ovarian pedicles were ready to be done.  A single free  tie was placed around the tubo-ovarian pedicle followed by a Heaney  clamp.  The pedicles were cut and a suture ligature Heaney tight closure  was done to achieve adequate hemostasis.  Following this, all long  instruments removed from the peritoneal cavity.  The cervical weighted  speculum placed back inside the vagina and pedicles were inspected, felt  to be hemostatic.  A 0 Vicryl suture was used  then to close the vaginal  cuff incorporating the aforementioned uterosacral ligaments at  each angle.  Excellent hemostasis was noted.  Foley catheter was placed  above the bladder.  Clear yellow urine was noted.  A 1-inch vaginal  packing with Estrace cream was used.  All instrument and lap counts were  correct x2.  The patient was awakened and taken to the recovery room in  stable condition.      Shelbie Proctor. Shawnie Pons, M.D.  Electronically Signed     TSP/MEDQ  D:  10/03/2007  T:  10/04/2007  Job:  [id number]

## 2010-07-29 NOTE — Assessment & Plan Note (Signed)
Desiree Walters, Desiree Walters              ACCOUNT NO.:  [id number]   MEDICAL RECORD NO.:  [id number]          PATIENT TYPE:  POB   LOCATION:  [facility] at [facility]         FACILITY:  [facility]   PHYSICIAN:  Allie Bossier, MD        DATE OF BIRTH:  [birth year]   DATE OF SERVICE:                                  CLINIC NOTE   SUBJECTIVE:  The patient is a 40 year old married, white gravida 7, para  2 elective ab 3, spontaneous ab 2 who comes in for her annual exam.  She  reports that she does not want any more children. She is greatly  disturbed by her previous miscarriages and wishes to avoid pregnancy. At  the same time she is currently using withdrawal for her birth control  method.  She was an IUD done as soon as possible, specifically the  Mirena.  She is aware of the risk of irregular bleeding, as well as  amenorrhea.   PAST MEDICAL HISTORY:  Significant for fibromyalgia, obsessive  compulsive disorder (her psychiatrist is Marisue Ivan, nurse  practitioner in [place]).  Another medical issue is obesity.   PAST SURGICAL HISTORY:  She has had saline implants in 2002,  appendectomy at 40 years of age, elective ab via dilatation and  curettage x3, deviated septum repair, toenail surgery.   FAMILY HISTORY:  Significant for diabetes in a maternal grandmother, but  no breast, gyn or colon malignancy.   ALLERGIES:  Quinolone.  Latex allergy no.   REVIEW OF SYSTEMS:  She is a Scientist, clinical (histocompatibility and immunogenetics) at [employer]  Married for  the last 8 years.  She denies any sexual dysfunction in the  relationship.  Her periods are every 20 days, and she denies a history  of abnormal Pap smears.  Her last one being done in 2006 at [facility].   PHYSICAL EXAMINATION:  VITAL SIGNS:  Weight is 206 pounds, blood  pressure is 140/99, pulse 77.  HEENT:  Normal.  HEART:  Regular rate and rhythm.  LUNGS:  Clear to auscultation bilaterally.  ABDOMEN:  Benign.  BREAST:  Normal.  EXTERNAL GENITALIA:  Normal.  VAGINA:  Normal.  CERVIX:  Normal.  UTERUS:  Normal size and shape, anteverted, mobile, not enlarged  adnexas.   ASSESSMENT & PLAN:  Annual exam.  I checked the Pap smear along with  gonorrhea and Chlamydia cultures. I have recommended self breast exams,  self-vulvar exams and a screening mammogram before age 52 with regards  to her birth control we will schedule her Mirena during her next period.      Allie Bossier, MD     MCD/MEDQ  D:  08/18/2006  T:  08/18/2006  Job:  [id number]

## 2010-07-29 NOTE — Assessment & Plan Note (Signed)
NAMESHELBE, Desiree Walters              ACCOUNT NO.:  [id number]   MEDICAL RECORD NO.:  [id number]          PATIENT TYPE:  POB   LOCATION:  [facility] at [facility]         [facility]ACILITY:  [facility]   PHYSICIAN:  Tinnie Gens, MD        DATE OF BIRTH:  14-Mar-1971   DATE OF SERVICE:  09/06/2007                                  CLINIC NOTE   CHIEF COMPLAINT:  Endometrial biopsy and Pap smear.   HISTORY OF PRESENT ILLNESS:  The patient is a 40 year old, gravida 7,  para 2-0-5-2 who comes in for her annual Pap smear.  It has been 1 year  since her last Pap smear.  Additionally, she is having menorrhagia.  She  has had an IUD inserted to help with that.  However, she continues to  have heavy bleeding.  The patient also has a 1-cm fibroid that she  thinks is causing her to have some significant pelvic pain.  The patient  has not had endometrial sampling and needs that.  Additionally, she had  a pelvic sonogram that showed a 5-cm complex right ovarian cyst that  needed follow up on Jul 26, 2007.   PAST MEDICAL HISTORY:  1. Obesity.  2. Fibromyalgia.  3. Obsessive-compulsive disorder.  4. Migraine headaches.   PAST SURGICAL HISTORY:  Breast implants, appendectomy, EAB x3, deviated  septum repair, and toenail surgery.   MEDICATIONS:  1. Neurontin 300 mg 4 t.i.d.  2. Skelaxin 800 mg 1 p.o. t.i.d.  3. Clonazepam 0.5 two p.o. at bedtime.  4. Hydrocodone and acetaminophen 5/500 t.i.d.  5. Toprol 100 mg p.o. at bedtime.  6. Cymbalta 90 mg 1 p.o. at bedtime.  7. Flexeril 10 mg 1 p.o. at bedtime.  8. Topamax 100 mg 1 p.o. at bedtime.   ALLERGIES:  QUINOLONES.   FAMILY HISTORY:  Diabetes.   SOCIAL HISTORY:  She works as a Scientist, clinical (histocompatibility and immunogenetics) at [employer]  She has  been married for 9 years.   PHYSICAL EXAMINATION:  VITAL SIGNS:  Weight is 190 pounds, blood  pressure 110/73, and pulse 75.  GENERAL:  She is a well-developed, well-nourished female in no acute  distress.  HEENT:  Normocephalic and atraumatic.   Sclerae anicteric.  NECK:  Supple.  Normal thyroid.  LUNGS:  Clear bilaterally.  CARDIOVASCULAR:  Regular rate and rhythm without rubs, gallops, or  murmurs.  ABDOMEN:  Soft, nontender, and nondistended.  GU:  Normal external female genitalia.  Normal vagina, is pink and  rugated.  Cervix is parous without lesion.  Uterus is small and  anteverted.  No adnexal mass or tenderness.  She does have some  tenderness on the uterus itself.   PROCEDURE:  After informed consent, source is cleaned with Betadine x2.  A 7 to 8-cm endometrial sampling was obtained.  IUD strings were  visualized with a cervical os.   IMPRESSION:  Menometrorrhagia, no better after intrauterine device  insertion.   PLAN:  1. Schedule TVH.  2. Right ovarian cyst, needs followup pelvic sonogram to evaluate this      prior to hysterectomy.  3. Yearly exam, needs Pap smear prior to scheduling hysterectomy,      follow  if this is abnormal, we will schedule her for a TVH on October 03, 2007, at 1 p.m.           ______________________________  Tinnie Gens, MD     TP/MEDQ  D:  09/06/2007  T:  09/07/2007  Job:  [id number]

## 2010-08-01 NOTE — Op Note (Signed)
Desiree Walters, Desiree Walters              ACCOUNT NO.:  [id number]   MEDICAL RECORD NO.:  [id number]          PATIENT TYPE:  AMB   LOCATION:  [facility]                           FACILITY:  [facility]   PHYSICIAN:  Carrington Clamp, M.D. DATE OF BIRTH:  1970/06/27   DATE OF PROCEDURE:  02/02/2005  DATE OF DISCHARGE:                                 OPERATIVE REPORT   PREOPERATIVE DIAGNOSIS:  Missed AB.   POSTOPERATIVE DIAGNOSIS:  Missed AB.   PROCEDURES:  Dilation and curettage.   SURGEON:  Carrington Clamp, M.D.   ANESTHESIA:  LMA.   SPECIMENS:  Uterine contents.   ESTIMATED BLOOD LOSS:  Was minimal.   IV FLUIDS:  700 mL.   URINE OUTPUT:  Was not measured.   COMPLICATIONS:  Were none.   FINDINGS:  7 weeks' size uterus down to 6 weeks' size mid with good cri post  procedure. Counts were correct x3. Medications were Ancef.   DESCRIPTION OF PROCEDURE:  After adequate LMA anesthesia was achieved, the  patient was prepped and draped in the usual sterile fashion in dorsal  lithotomy position. Bladder was emptied with red rubber catheter and  speculum placed in the vagina. Single-tooth tenaculum used to stabilize  cervix and cervix was dilated with Shawnie Pons dilators. A 8 mm suction curette  was passed into the uterine cavity and suction curettage was performed.  After the suction curettage, sharp curettage showed that there was good cri  and minimal bleeding. All instruments were then withdrawn from the vagina.  The patient received a dose of Ancef because the cervix was so easy to  dilate. It was apparently already on its way to being an incomplete AB. The  patient tolerated the procedure well. She returns to recovery room in stable  condition.      Carrington Clamp, M.D.  Electronically Signed    MH/MEDQ  D:  02/02/2005  T:  02/02/2005  Job:  [id number]

## 2010-08-01 NOTE — Op Note (Signed)
NAMESYDNY, SCHNITZLER              ACCOUNT NO.:  [id number]   MEDICAL RECORD NO.:  [id number]          PATIENT TYPE:  AMB   LOCATION:  [facility]                           [facility]ILITY[facility]   PHYSICIAN:  Carrington Clamp, M.D. DATE OF BIRTH:  01-23-1971   DATE OF PROCEDURE:  08/07/2005  DATE OF DISCHARGE:                                 OPERATIVE REPORT   PREOPERATIVE DIAGNOSIS:  Missed abortion.   POSTOPERATIVE DIAGNOSIS:  Missed abortion.   PROCEDURE:  Dilation, evacuation.   SURGEON:  Carrington Clamp, M.D.   ASSISTANT:  None.   ANESTHESIA:  General LMA.   SPECIMENS:  Uterine contents.   ESTIMATED BLOOD LOSS:  200 mL.   IV FLUIDS:  1300 mL.   URINE OUTPUT:  Not measured.   COMPLICATIONS:  None.   FINDINGS:  A 10 weeks' size uterus down to 7 weeks' size and good crie.   MEDICATIONS:  Methergine IM.   COUNTS:  Correct x3.   TECHNIQUE:  After adequate general anesthesia was achieved, the patient was  prepped and draped in the usual sterile fashion in the dorsal lithotomy  position.  After the bladder had been emptied with the red rubber catheter,  a speculum was placed in the vagina.  The cervix was grasped with a single-  tooth tenaculum; and dilated with a set of Pratt dilators.   The 10-mm suction curette was placed in the uterus; and all of the contents  were suction curettage.  Alternating suction and sharp curettage were  performed until good crie was obtained.  There was minimal bleeding,  although a dose of Methergine was given just in case.  The patient tolerated  the procedure well' and was returned to the recovery room in stable  condition.      Carrington Clamp, M.D.  Electronically Signed     MH/MEDQ  D:  08/07/2005  T:  08/08/2005  Job:  [id number]

## 2010-08-15 ENCOUNTER — Ambulatory Visit: Payer: Self-pay | Admitting: Internal Medicine

## 2010-09-02 ENCOUNTER — Telehealth: Payer: Self-pay | Admitting: Internal Medicine

## 2010-09-02 NOTE — Telephone Encounter (Signed)
Pt is requesting refill on adderall xr 30mg  and ritalin 10mg  2 daily. Last rx written on 04-2010. Pt last seen 08-16-2009. She has follow-up appt on 09-16-10. Please advise if ok to refill. Carron Curie, CMA

## 2010-09-03 MED ORDER — METHYLPHENIDATE HCL 10 MG PO TABS
20.0000 mg | ORAL_TABLET | Freq: Every day | ORAL | Status: DC | PRN
Start: 2010-09-03 — End: 2010-10-29

## 2010-09-03 MED ORDER — AMPHETAMINE-DEXTROAMPHET ER 30 MG PO CP24: 30.0000 mg | ORAL_CAPSULE | Freq: Every day | ORAL | Status: DC | PRN

## 2010-09-03 NOTE — Telephone Encounter (Signed)
LMOVM that Rx is ready for pick up at front desk; if any questions or concerns please call me at the office.

## 2010-09-03 NOTE — Telephone Encounter (Signed)
rx's printed out per CDY's okay.  rx's placed on his cart with the message and an envelope.

## 2010-09-03 NOTE — Telephone Encounter (Signed)
Per CY okay to refill.  

## 2010-09-16 ENCOUNTER — Ambulatory Visit: Payer: Self-pay | Admitting: Internal Medicine

## 2010-10-23 ENCOUNTER — Other Ambulatory Visit: Payer: Self-pay | Admitting: Surgery

## 2010-10-29 ENCOUNTER — Telehealth: Payer: Self-pay | Admitting: Internal Medicine

## 2010-10-29 MED ORDER — AMPHETAMINE-DEXTROAMPHET ER 30 MG PO CP24: 30.0000 mg | ORAL_CAPSULE | Freq: Every day | ORAL | Status: DC | PRN

## 2010-10-29 MED ORDER — METHYLPHENIDATE HCL 10 MG PO TABS: 20.0000 mg | ORAL_TABLET | Freq: Every day | ORAL | Status: DC | PRN

## 2010-10-29 NOTE — Telephone Encounter (Signed)
RX printed and placed on CDY cart for signature. Pt wants to pick this up.

## 2010-10-29 NOTE — Telephone Encounter (Signed)
I left message on patient number that the phone message was complete and she could come by the office to pick up Rx's.

## 2010-11-12 ENCOUNTER — Other Ambulatory Visit: Payer: Self-pay | Admitting: Anesthesiology

## 2010-11-12 DIAGNOSIS — M545 Low back pain, unspecified: Secondary | ICD-10-CM

## 2010-12-12 LAB — CBC
HCT: 36.4
HCT: 42.8
Hemoglobin: 12.2
Hemoglobin: 14.1
MCHC: 33
MCHC: 33.4
MCV: 88.8
MCV: 89.1
Platelets: 190
Platelets: 223
RBC: 4.09
RBC: 4.81
RDW: 14.4
RDW: 14.8
WBC: 10.7 — ABNORMAL HIGH
WBC: 9.3

## 2010-12-12 LAB — CROSSMATCH
ABO/RH(D): O POS
Antibody Screen: NEGATIVE

## 2011-01-16 ENCOUNTER — Other Ambulatory Visit: Payer: Self-pay | Admitting: Family Medicine

## 2011-01-16 MED ORDER — CYANOCOBALAMIN 1000 MCG/ML IJ SOLN: 1000.0000 ug | INTRAMUSCULAR | Status: DC

## 2011-01-16 NOTE — Telephone Encounter (Signed)
Patient requesting B-12 refill be called into [facility] today.

## 2011-01-16 NOTE — Telephone Encounter (Signed)
Rx sent in as requested. 

## 2011-01-23 ENCOUNTER — Encounter: Payer: Self-pay | Admitting: Family Medicine

## 2011-01-23 ENCOUNTER — Ambulatory Visit (INDEPENDENT_AMBULATORY_CARE_PROVIDER_SITE_OTHER): Payer: 59 | Admitting: Family Medicine

## 2011-01-23 VITALS — BP 144/104 | HR 76 | Temp 98.1°F | Wt 156.0 lb

## 2011-01-23 DIAGNOSIS — I10 Essential (primary) hypertension: Secondary | ICD-10-CM

## 2011-01-23 DIAGNOSIS — F17200 Nicotine dependence, unspecified, uncomplicated: Secondary | ICD-10-CM

## 2011-01-23 DIAGNOSIS — R002 Palpitations: Secondary | ICD-10-CM

## 2011-01-23 LAB — TSH: TSH: 1.016 u[IU]/mL (ref 0.350–4.500)

## 2011-01-23 LAB — BASIC METABOLIC PANEL
BUN: 12 mg/dL (ref 6–23)
CO2: 23 mEq/L (ref 19–32)
Calcium: 9.1 mg/dL (ref 8.4–10.5)
Chloride: 107 mEq/L (ref 96–112)
Creat: 0.65 mg/dL (ref 0.50–1.10)
Glucose, Bld: 91 mg/dL (ref 70–99)
Potassium: 4.2 mEq/L (ref 3.5–5.3)
Sodium: 138 mEq/L (ref 135–145)

## 2011-01-23 MED ORDER — METOPROLOL SUCCINATE ER 50 MG PO TB24: 50.0000 mg | ORAL_TABLET | Freq: Every day | ORAL | Status: DC

## 2011-01-23 NOTE — Progress Notes (Signed)
Insomnia (worse off B12, so she got back on the shots last week) and the insomnia got better in meantime.  She had some palpitations since the injection.  BP had been up and hands felt puffy. Had been off toprol for 2 months, off ARB for a few weeks.  Working 3rd shift at [employer] for [employer].  She gets anxious and then her chest feels tight, but no frank chest pain.  This has been going on most of the week.  No syncope, occ presyncope that would self resolve.  She is on stimulant for narcolepsy.  She smokes, and "I'd rather smoke than eat."  Not on a low salt diet.    No personal h/o CAD.  +FH CAD.    PMH and SH reviewed  ROS: See HPI, otherwise noncontributory.  Meds, vitals, and allergies reviewed.   GEN: nad, alert and oriented HEENT: mucous membranes moist NECK: supple w/o LA, thyroid not ttp CV: rrr. PULM: ctab, no inc wob ABD: soft, +bs EXT: no edema SKIN: no acute rash  EKG wnl

## 2011-01-23 NOTE — Patient Instructions (Addendum)
You can get your results through our phone system.  Follow the instructions on the blue card. I would schedule a follow up appointment with Dr. Milinda Antis in about 2 weeks about your pressure.  If the palpitations are getting worse in the meantime, then let us know. Take care.

## 2011-01-25 NOTE — Assessment & Plan Note (Signed)
D/w pt about cessation.  

## 2011-01-25 NOTE — Assessment & Plan Note (Signed)
Restart betablocker.  She is on a stimulant, smoking and has h/o HTN.  If she has no more palpitations in meantime, then just f/u with PMD re: BP.  If more palpations in meantime, then she'll notify us and we can consider cards eval.  See notes on labs.  >25 min spent with face to face with patient, >50% counseling and/or coordinating care

## 2011-01-27 ENCOUNTER — Emergency Department (HOSPITAL_BASED_OUTPATIENT_CLINIC_OR_DEPARTMENT_OTHER)
Admission: EM | Admit: 2011-01-27 | Discharge: 2011-01-27 | Disposition: A | Discharge status: Home / Self Care | Payer: 59 | Attending: Emergency Medicine | Admitting: Emergency Medicine

## 2011-01-27 ENCOUNTER — Encounter (HOSPITAL_BASED_OUTPATIENT_CLINIC_OR_DEPARTMENT_OTHER): Payer: Self-pay | Admitting: *Deleted

## 2011-01-27 ENCOUNTER — Emergency Department (INDEPENDENT_AMBULATORY_CARE_PROVIDER_SITE_OTHER): Payer: 59

## 2011-01-27 ENCOUNTER — Ambulatory Visit: Payer: 59

## 2011-01-27 DIAGNOSIS — F17200 Nicotine dependence, unspecified, uncomplicated: Secondary | ICD-10-CM | POA: Insufficient documentation

## 2011-01-27 DIAGNOSIS — M25569 Pain in unspecified knee: Secondary | ICD-10-CM | POA: Insufficient documentation

## 2011-01-27 DIAGNOSIS — M25462 Effusion, left knee: Secondary | ICD-10-CM

## 2011-01-27 DIAGNOSIS — M25469 Effusion, unspecified knee: Secondary | ICD-10-CM | POA: Insufficient documentation

## 2011-01-27 DIAGNOSIS — E785 Hyperlipidemia, unspecified: Secondary | ICD-10-CM | POA: Insufficient documentation

## 2011-01-27 HISTORY — DX: Vitamin B12 deficiency anemia due to intrinsic factor deficiency: D51.0

## 2011-01-27 LAB — SYNOVIAL CELL COUNT + DIFF, W/ CRYSTALS
Crystals, Fluid: NONE SEEN
Eosinophils-Synovial: 0 % (ref 0–1)
Lymphocytes-Synovial Fld: 71 % — ABNORMAL HIGH (ref 0–20)
Monocyte-Macrophage-Synovial Fluid: 0 % — ABNORMAL LOW (ref 50–90)
Neutrophil, Synovial: 29 % — ABNORMAL HIGH (ref 0–25)
WBC, Synovial: 221 /mm3 — ABNORMAL HIGH (ref 0–200)

## 2011-01-27 LAB — PATHOLOGIST SMEAR REVIEW

## 2011-01-27 LAB — GRAM STAIN

## 2011-01-27 MED ORDER — NAPROXEN 375 MG PO TABS: 375.0000 mg | ORAL_TABLET | Freq: Two times a day (BID) | ORAL | Status: AC

## 2011-01-27 MED ORDER — LIDOCAINE-EPINEPHRINE 2 %-1:100000 IJ SOLN
INTRAMUSCULAR | Status: AC
  Administered 2011-01-27: 04:00:00
  Filled 2011-01-27: qty 1

## 2011-01-27 NOTE — ED Notes (Signed)
Pt has chronic pain and swelling to left knee, had fluid drained off knee 3 mos ago

## 2011-01-27 NOTE — ED Notes (Signed)
MD at bedside for joint aspiration of left knee

## 2011-01-27 NOTE — ED Notes (Signed)
MD at bedside. 

## 2011-01-27 NOTE — ED Provider Notes (Signed)
History     CSN: [id number] Arrival date & time: 01/27/2011  2:49 AM   First MD Initiated Contact with Patient 01/27/11 [phone]      Chief Complaint  Patient presents with  . Knee Pain    (Consider location/radiation/quality/duration/timing/severity/associated sxs/prior treatment) Patient is a 40 y.o. female presenting with knee pain. The history is provided by the patient.  Knee Pain This is a recurrent problem. The current episode started yesterday. The problem occurs constantly. The problem has not changed since onset.Pertinent negatives include no chest pain, no abdominal pain, no headaches and no shortness of breath. Associated symptoms comments: No fevers or chills . The symptoms are aggravated by standing and walking. The symptoms are relieved by relaxation and lying down. She has tried nothing for the symptoms. The treatment provided no relief.   Has a history of left knee effusion, followed by orthopedics [facility]. About 3 months ago had arthrocentesis of her left knee which improved her symptoms a lot. Last night she did work a lot on her feet and had increased pain and swelling. No redness, weakness or numbness. She has had an MRI of her knee and was referred to orthopedics. No new trauma. Severity is mild. Pain is dull in nature. Pain is not radiating. No other aggravating or alleviating factors. Has not taken any medications and declines any at this time.  Past Medical History  Diagnosis Date  . Tobacco abuse   . OCD (obsessive compulsive disorder)   . Hyperlipemia   . Hypersomnia     NPSG 08-25-07-1.2/hr  . Pernicious anemia     Past Surgical History  Procedure Date  . Foot surgery 2005; 2008  . Nasal septum surgery   . Abdominal hysterectomy     Family History  Problem Relation Age of Onset  . Hypertension Father   . Coronary artery disease Father   . Sleep apnea Father     with CPAP  . Hypertension Mother   . Hyperlipidemia Mother   . Hypertension  Sister   . Hyperlipidemia Other     aunts/uncles  . Coronary artery disease Other     aunts/uncles    History  Substance Use Topics  . Smoking status: Current Everyday Smoker  . Smokeless tobacco: Not on file  . Alcohol Use: Not on file    OB History    Grav Para Term Preterm Abortions TAB SAB Ect Mult Living                  Review of Systems  Constitutional: Negative for fever and chills.  HENT: Negative for neck pain and neck stiffness.   Eyes: Negative for pain.  Respiratory: Negative for shortness of breath.   Cardiovascular: Negative for chest pain.  Gastrointestinal: Negative for abdominal pain and constipation.  Genitourinary: Negative for dysuria.  Musculoskeletal: Positive for joint swelling. Negative for back pain and gait problem.  Skin: Negative for rash.  Neurological: Negative for headaches.  All other systems reviewed and are negative.    Allergies  Ciprofloxacin; Levofloxacin; and Varenicline tartrate  Home Medications   Current Outpatient Rx  Name Route Sig Dispense Refill  . AMPHETAMINE-DEXTROAMPHETAMINE 30 MG PO CP24 Oral Take 1 capsule (30 mg total) by mouth daily as needed. 30 capsule 0  . CYANOCOBALAMIN 1000 MCG/ML IJ SOLN Intramuscular Inject 1 mL (1,000 mcg total) into the muscle every 30 (thirty) days. 1 mL 6  . HYDROCODONE-ACETAMINOPHEN 10-325 MG PO TABS Oral Take 1 tablet by mouth every  4 (four) hours as needed.      Marland Kitchen METOPROLOL SUCCINATE 50 MG PO TB24 Oral Take 1 tablet (50 mg total) by mouth daily. 30 tablet 2  . MINOCYCLINE HCL 65 MG PO TB24  Take one by mouth daily       BP 134/93  Pulse 72  Temp(Src) 97.9 F (36.6 C) (Oral)  Resp 16  Ht 5\' 5"  (1.651 m)  Wt 156 lb (70.761 kg)  BMI 25.96 kg/m2  SpO2 100%  Physical Exam  Constitutional: She is oriented to person, place, and time. She appears well-developed and well-nourished.  HENT:  Head: Normocephalic and atraumatic.  Eyes: Conjunctivae and EOM are normal. Pupils are  equal, round, and reactive to light.  Neck: Trachea normal. Neck supple. No thyromegaly present.  Cardiovascular: Normal rate, regular rhythm, S1 normal, S2 normal and normal pulses.     No systolic murmur is present   No diastolic murmur is present  Pulses:      Radial pulses are 2+ on the right side, and 2+ on the left side.  Pulmonary/Chest: Effort normal and breath sounds normal. She has no wheezes. She has no rhonchi. She has no rales. She exhibits no tenderness.  Abdominal: Soft. Normal appearance and bowel sounds are normal. There is no tenderness. There is no CVA tenderness and negative Murphy's sign.  Musculoskeletal:       Left lower extremity: Large knee joint effusion. No tenderness to palpation. There is no erythema or increased warmth to touch. Distal neurovascular is intact. Good range of motion at the knee. No skin breaks or evidence of trauma.  Neurological: She is alert and oriented to person, place, and time. She has normal strength. No cranial nerve deficit or sensory deficit. GCS eye subscore is 4. GCS verbal subscore is 5. GCS motor subscore is 6.  Skin: Skin is warm and dry. No rash noted. She is not diaphoretic.  Psychiatric: Her speech is normal.       Cooperative and appropriate    ED Course  ARTHOCENTESIS Date/Time: 01/27/2011 4:50 AM Performed by: Sunnie Nielsen Authorized by: Sunnie Nielsen Consent: Verbal consent obtained. Risks and benefits: risks, benefits and alternatives were discussed Consent given by: patient Patient understanding: patient states understanding of the procedure being performed Patient consent: the patient's understanding of the procedure matches consent given Procedure consent: procedure consent matches procedure scheduled Patient identity confirmed: verbally with patient Time out: Immediately prior to procedure a "time out" was called to verify the correct patient, procedure, equipment, support staff and site/side marked as  required. Indications: joint swelling  Location: Left knee. Local anesthesia used: yes Anesthesia: local infiltration Local anesthetic: lidocaine 2% with epinephrine Anesthetic total: 2 ml Patient sedated: no Preparation: Patient was prepped and draped in the usual sterile fashion. Needle gauge: 20 G Approach: lateral Aspirate: clear Aspirate amount: 25 ml Patient tolerance: Patient tolerated the procedure well with no immediate complications. Comments: Joint fluid sent for analysis. No bleeding. Sterile dressing placed.   (including critical care time)  Dg Knee 1-2 Views Left  01/27/2011  *RADIOLOGY REPORT*  Clinical Data: Left knee swelling for 3 days.  LEFT KNEE - 1-2 VIEW  Comparison: None.  Findings: There is no evidence of fracture or dislocation.  The joint spaces are preserved.  No significant degenerative change is seen; the patellofemoral joint is grossly unremarkable in appearance.  A fabella is noted.  A large joint effusion is seen.  The visualized soft tissues are otherwise unremarkable in appearance.  IMPRESSION:  1.  No evidence of fracture or dislocation. 2.  Large knee joint effusion noted.  Original Report Authenticated By: Tonia Ghent, M.D.      MDM     Left knee effusion with history of same. Therapeutic and diagnostic Arthrocentesis as above. Patient declines pain medications here and is requesting arthrocentesis for symptoms only. Rx NSAIDs to go as needed. Patient has orthopedic followup and requests to be discharged pending fluid analysis results. she is stable for discharge home, agrees to close followup with her orthopedic.      Sunnie Nielsen, MD 01/27/11 [phone]

## 2011-01-27 NOTE — ED Notes (Signed)
Sterile dressing per MD order, pt tolerated well.

## 2011-01-30 ENCOUNTER — Ambulatory Visit: Payer: 59 | Admitting: Family Medicine

## 2011-01-30 LAB — BODY FLUID CULTURE: Culture: NO GROWTH

## 2011-02-04 ENCOUNTER — Ambulatory Visit: Payer: 59 | Admitting: Family Medicine

## 2011-02-16 ENCOUNTER — Encounter: Payer: Self-pay | Admitting: Family Medicine

## 2011-02-16 ENCOUNTER — Ambulatory Visit (INDEPENDENT_AMBULATORY_CARE_PROVIDER_SITE_OTHER): Payer: 59 | Admitting: Family Medicine

## 2011-02-16 DIAGNOSIS — E785 Hyperlipidemia, unspecified: Secondary | ICD-10-CM

## 2011-02-16 DIAGNOSIS — Z0289 Encounter for other administrative examinations: Secondary | ICD-10-CM

## 2011-02-19 NOTE — Progress Notes (Signed)
  Subjective:    Patient ID: Desiree Walters, female    DOB: 08/11/1970, 40 y.o.   MRN: [id number]  HPI  ? No show?  Review of Systems     Objective:   Physical Exam        Assessment & Plan:

## 2011-04-08 ENCOUNTER — Telehealth: Payer: Self-pay | Admitting: Family Medicine

## 2011-04-08 NOTE — Telephone Encounter (Signed)
Triage Record Num: [id number] Operator: Geanie Berlin Patient Name: Desiree Walters Call Date & Time: 04/07/2011 3:27:11PM Patient Phone: [phone] PCP: Patient Gender: Female PCP Fax : Patient DOB: 1970-11-10 Practice Name: [facility] Reason for Call: Caller: Madalena/Patient [phone] calling with a question about B-12 Injections.The medication was written by Roxy Manns A. Reports does not think B-12 injections, 1 cc q month are helping d/t still feels fatigue and leg aching for past 2 weeks. Afebrile. Diagnosed with pernicious anemia approx 2 yrs ago. Origionally had to give B12 injections weekly to "build her up." Asking if can > injections to two injections q month X 2 months then retest B12 level in March. Declined tirage. Info noted and sent to MD for adult with questions about prescribed medications not covered by available resources per Med Question Call Guideline. OFFICE PLEASE CALL BACK. Protocol(s) Used: Medication Questions - Adult Recommended Outcome per Protocol: Speak with Provider or Pharmacist within 24 hours Override Outcome if Used in Protocol: Information Noted and Sent to Office RN Reason for Override Outcome: Office To Call Pt. W/ F/u. Reason for Outcome: Older adult with questions about prescribed and/or nonprescribed medications not covered by available resources Care Advice: ~ 04/07/2011 3:39:12PM Page 1 of 1 [email or web address]

## 2011-04-08 NOTE — Telephone Encounter (Signed)
Left v/m on only contact # for pt to call back. 

## 2011-04-08 NOTE — Telephone Encounter (Signed)
Please have her follow up with me -- will make a plan

## 2011-04-13 NOTE — Telephone Encounter (Signed)
Left vm for pt to callback 

## 2011-04-15 NOTE — Telephone Encounter (Signed)
Left vm for pt to callback 

## 2011-04-17 NOTE — Telephone Encounter (Signed)
Left message on cell phone voicemail for patient to return call. 

## 2011-05-08 ENCOUNTER — Telehealth: Payer: Self-pay | Admitting: Internal Medicine

## 2011-05-08 MED ORDER — AMPHETAMINE-DEXTROAMPHET ER 30 MG PO CP24: 30.0000 mg | ORAL_CAPSULE | Freq: Every day | ORAL | Status: DC | PRN

## 2011-05-08 MED ORDER — METHYLPHENIDATE HCL 10 MG PO TABS: 10.0000 mg | ORAL_TABLET | Freq: Two times a day (BID) | ORAL | Status: DC

## 2011-05-08 NOTE — Telephone Encounter (Signed)
I have printed off RX and placed on CDY cart for signature. Will mail to pt home once done

## 2011-05-08 NOTE — Telephone Encounter (Signed)
I have left message on patients number that we have placed in mail and if any questions or concerns then call the office.

## 2011-06-17 ENCOUNTER — Telehealth: Payer: Self-pay | Admitting: Internal Medicine

## 2011-06-17 MED ORDER — AMPHETAMINE-DEXTROAMPHET ER 30 MG PO CP24: 30.0000 mg | ORAL_CAPSULE | Freq: Every day | ORAL | Status: DC | PRN

## 2011-06-17 NOTE — Telephone Encounter (Signed)
I spoke with pt and she is requesting to p/u her adderall rx tomorrow. i have printed off rx and placed on cdy cart for signature. Please advise Dr. Maple Hudson, thanks

## 2011-06-18 NOTE — Telephone Encounter (Signed)
Pt aware to pick up rx 

## 2011-07-16 ENCOUNTER — Other Ambulatory Visit: Payer: Self-pay | Admitting: Family Medicine

## 2011-07-27 ENCOUNTER — Telehealth: Payer: Self-pay | Admitting: Internal Medicine

## 2011-07-27 MED ORDER — AMPHETAMINE-DEXTROAMPHET ER 30 MG PO CP24: 30.0000 mg | ORAL_CAPSULE | Freq: Every day | ORAL | Status: DC | PRN

## 2011-07-27 NOTE — Telephone Encounter (Signed)
Per CY-okay to give one month supply but patient must MAKE and KEEP OV with CY. Pt is aware RX at front for pick up and she made appt for 08-13-11 at 1115am with CY.

## 2011-07-27 NOTE — Telephone Encounter (Signed)
Pt last seen 08/2009---CY please advise if ok to print off rx for the adderall.   thanks

## 2011-08-13 ENCOUNTER — Encounter: Payer: Self-pay | Admitting: Internal Medicine

## 2011-08-13 ENCOUNTER — Ambulatory Visit (INDEPENDENT_AMBULATORY_CARE_PROVIDER_SITE_OTHER): Payer: 59 | Admitting: Internal Medicine

## 2011-08-13 ENCOUNTER — Ambulatory Visit (INDEPENDENT_AMBULATORY_CARE_PROVIDER_SITE_OTHER)
Admission: RE | Admit: 2011-08-13 | Discharge: 2011-08-13 | Disposition: A | Discharge status: Home / Self Care | Payer: 59 | Source: Ambulatory Visit | Attending: Internal Medicine | Admitting: Internal Medicine

## 2011-08-13 VITALS — BP 150/102 | HR 70 | Ht 64.0 in | Wt 156.0 lb

## 2011-08-13 DIAGNOSIS — F17200 Nicotine dependence, unspecified, uncomplicated: Secondary | ICD-10-CM

## 2011-08-13 DIAGNOSIS — Z720 Tobacco use: Secondary | ICD-10-CM

## 2011-08-13 DIAGNOSIS — G4710 Hypersomnia, unspecified: Secondary | ICD-10-CM

## 2011-08-13 NOTE — Patient Instructions (Addendum)
Ok to call for Adderall refill as needed  Order- CXR- dx Tobacco abuse  Please see Dr Milinda Antis about your blood pressure 150/102 today (Had not taken Adderall today)  Please work on the smoking- cut down and quit.  You can do it.

## 2011-08-13 NOTE — Progress Notes (Signed)
08/13/11- [name] current smoker followed for hypersomnia with snoring LOV- 08/16/09 Working 6 days per week and using Adderall on work days. Did not take any today-note BP. 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 Not using Ritalin which made her sleepier. Adderall 30 XR 1/ day. No sleep paralysis or cataplexy.  ROS-see HPI Constitutional:   No-   weight loss, night sweats, fevers, chills, fatigue, lassitude. HEENT:   No-  headaches, difficulty swallowing, tooth/dental problems, sore throat,       No-  sneezing, itching, ear ache, nasal congestion, post nasal drip,  CV:  No-   chest pain, orthopnea, PND, swelling in lower extremities, anasarca, dizziness, palpitations Resp: No-   shortness of breath with exertion or at rest.              No-   productive cough,  No non-productive cough,  No- coughing up of blood.              No-   change in color of mucus.  No- wheezing.   Skin: No-   rash or lesions. GI:  No-   heartburn, indigestion, abdominal pain, nausea, vomiting,  GU:  MS:  No-   joint pain or swelling.   Neuro-     nothing unusual Psych:  No- change in mood or affect. No depression or anxiety.  No memory loss.  OBJ- Physical Exam General- Alert, Oriented, Affect-appropriate, Distress- none acute. Medium build. Skin- rash-none, lesions- none, excoriation- none. Severely over tanned. Lymphadenopathy- none Head- atraumatic            Eyes- Gross vision intact, PERRLA, conjunctivae and secretions clear            Ears- Hearing, canals-normal            Nose- Clear, no-Septal dev, mucus, polyps, erosion, perforation             Throat- Mallampati II , mucosa clear , drainage- none, tonsils- atrophic Neck- flexible , trachea midline, no stridor , thyroid nl, carotid no bruit Chest - symmetrical excursion , unlabored           Heart/CV- RRR , no murmur , no gallop  , no rub, nl s1 s2                           - JVD- none , edema- none, stasis changes- none,  varices- none           Lung- clear to P&A, wheeze- none, cough- none , dullness-none, rub- none           Chest wall-  Abd-  Br/ Gen/ Rectal- Not done, not indicated Extrem- cyanosis- none, clubbing, none, atrophy- none, strength- nl Neuro- grossly intact to observation

## 2011-08-18 NOTE — Progress Notes (Signed)
Quick Note:  LMTCB ______ 

## 2011-08-18 NOTE — Assessment & Plan Note (Signed)
Active smoker not interested in quitting. She listened politely as I discussed supportive measures and medical motivation. Plan-chest x-ray.

## 2011-08-18 NOTE — Assessment & Plan Note (Signed)
We are seeking her old paper chart for sleep study documentation. 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 I'm concerned about her blood pressure especially because she did not take her Adderall today. We have asked her to see Dr. Milinda Antis for evaluation of this.

## 2011-08-20 NOTE — Progress Notes (Signed)
Quick Note:  LMTCB ______ 

## 2011-08-21 NOTE — Progress Notes (Signed)
Quick Note:  Pt aware of results. ______ 

## 2011-09-24 ENCOUNTER — Ambulatory Visit: Payer: 59 | Admitting: Psychology

## 2011-10-01 ENCOUNTER — Ambulatory Visit: Payer: 59 | Admitting: Psychology

## 2011-12-19 ENCOUNTER — Other Ambulatory Visit: Payer: Self-pay | Admitting: Family Medicine

## 2012-02-18 ENCOUNTER — Telehealth: Payer: Self-pay | Admitting: Internal Medicine

## 2012-02-18 NOTE — Telephone Encounter (Signed)
Please advise if okay to refill. Thanks.  

## 2012-02-19 MED ORDER — METHYLPHENIDATE HCL 10 MG PO TABS: 10.0000 mg | ORAL_TABLET | Freq: Two times a day (BID) | ORAL | Status: DC

## 2012-02-19 MED ORDER — AMPHETAMINE-DEXTROAMPHET ER 30 MG PO CP24: 30.0000 mg | ORAL_CAPSULE | Freq: Every day | ORAL | Status: DC | PRN

## 2012-02-19 NOTE — Telephone Encounter (Signed)
Per CY-okay to refill. Rx's printed for CY to sign. Rx's at front for pick up. LMOVM that they are ready for pick up.

## 2012-03-11 ENCOUNTER — Telehealth: Payer: Self-pay | Admitting: Internal Medicine

## 2012-03-11 NOTE — Telephone Encounter (Signed)
Spoke with patient,patient states in the last month she has started taking Ritalin added to her adderrall to help her stay awak in the morning.  Now patient states she is having a difficult time "settling" down in the evening to sleep.  Patient would like Dr. Sinclair Ship recs on this problem and would like to know if a rx could possibly be called in to help her relax more in the evening. Dr. Maple Hudson please advise, thank you.   Last OV:08/13/11-- with 1 yr follow up  Allergies  Allergen Reactions  . Ciprofloxacin     REACTION: reaction not known  . Levofloxacin     REACTION: reaction not known  . Varenicline Tartrate     REACTION: headache

## 2012-03-11 NOTE — Telephone Encounter (Signed)
Per CY-back off on the ritalin and no extra meds to give; pt aware.

## 2012-04-07 ENCOUNTER — Telehealth: Payer: Self-pay | Admitting: Internal Medicine

## 2012-04-07 NOTE — Telephone Encounter (Signed)
Last ov w/ CY 5.30.13, follow up in 1 year >> 5.30.14.    Patient Instructions     Ok to call for Adderall refill as needed  Order- CXR- dx Tobacco abuse  Please see Dr Milinda Antis about your blood pressure 150/102 today (Had not taken Adderall today)  Please work on the smoking- cut down and quit. You can do it.   Due to the type of medication requested, LMOM TCB x1 to verify the medication, dose and directions.   Adderall 30mg  1 by mouth once daily as needed

## 2012-04-07 NOTE — Telephone Encounter (Signed)
Pt returned call. Kathleen W Perdue  

## 2012-04-08 MED ORDER — AMPHETAMINE-DEXTROAMPHET ER 30 MG PO CP24: 30.0000 mg | ORAL_CAPSULE | Freq: Every day | ORAL | Status: DC | PRN

## 2012-04-08 NOTE — Telephone Encounter (Signed)
Rx printed, signed, and mailed to patient as requested. Pt aware.

## 2012-08-12 ENCOUNTER — Ambulatory Visit: Payer: 59 | Admitting: Internal Medicine

## 2012-09-08 ENCOUNTER — Telehealth: Payer: Self-pay | Admitting: Internal Medicine

## 2012-09-08 MED ORDER — AMPHETAMINE-DEXTROAMPHET ER 30 MG PO CP24: 30.0000 mg | ORAL_CAPSULE | Freq: Every day | ORAL | Status: DC | PRN

## 2012-09-08 MED ORDER — METHYLPHENIDATE HCL 10 MG PO TABS: 10.0000 mg | ORAL_TABLET | Freq: Two times a day (BID) | ORAL | Status: DC

## 2012-09-08 NOTE — Telephone Encounter (Signed)
RX;s printed out.

## 2012-09-08 NOTE — Telephone Encounter (Signed)
Pt is requesting refills of adderall and ritalin.  Last ov was 08/13/2011 and pt has a pending appt with CY on 10/03/2012.  Please advise if ok to go ahead and give refills of these meds for the pt . Thanks  Allergies  Allergen Reactions  . Ciprofloxacin     REACTION: reaction not known  . Levofloxacin     REACTION: reaction not known  . Varenicline Tartrate     REACTION: headache

## 2012-09-08 NOTE — Telephone Encounter (Signed)
Per CY okay to refill.  

## 2012-09-09 NOTE — Telephone Encounter (Signed)
ATC patient, no answer LMOMTCB 

## 2012-09-09 NOTE — Telephone Encounter (Signed)
Ritalin and adderall rxs signed by CDY and placed at front for pick up.  lmomtcb to inform pt.

## 2012-09-09 NOTE — Telephone Encounter (Signed)
Spoke with pt and notified rxs are ready to be picked up

## 2012-10-03 ENCOUNTER — Ambulatory Visit: Payer: 59 | Admitting: Internal Medicine

## 2012-10-25 ENCOUNTER — Telehealth: Payer: Self-pay | Admitting: Internal Medicine

## 2012-10-25 NOTE — Telephone Encounter (Signed)
Per CY-patient can have a refill until next open slot on schedule but MUST keep appointment.   LMTCB

## 2012-10-26 NOTE — Telephone Encounter (Signed)
lmomtcb on pt's home and cell phone #s

## 2012-10-27 MED ORDER — AMPHETAMINE-DEXTROAMPHET ER 30 MG PO CP24: 30.0000 mg | ORAL_CAPSULE | Freq: Every day | ORAL | Status: DC | PRN

## 2012-10-27 NOTE — Telephone Encounter (Signed)
Called, spoke with pt - Explained below to her.  Pt states she can really only come in on a Monday.  Dr. Roxy Cedar first available Monday is Dec 19, 2012.  Pt was last seen by CDY on 08/13/11.  Dr. Maple Hudson, is this date too far out to give adderall rxs for?  Pt is aware we are checking on this and will call her back.  Pls advise.  Thank you.

## 2012-10-27 NOTE — Telephone Encounter (Signed)
Pt returned call & states she will try back this afternoon.  Desiree Walters

## 2012-10-27 NOTE — Telephone Encounter (Signed)
Per CY-okay to fill til her OV

## 2012-10-27 NOTE — Telephone Encounter (Signed)
Spoke with patient, patient aware Rx has been printed out and will by upfront for pick up Nothing further needed at this time

## 2012-12-09 ENCOUNTER — Telehealth: Payer: Self-pay | Admitting: Internal Medicine

## 2012-12-09 MED ORDER — AMPHETAMINE-DEXTROAMPHET ER 30 MG PO CP24: 30.0000 mg | ORAL_CAPSULE | Freq: Every day | ORAL | Status: DC | PRN

## 2012-12-09 NOTE — Telephone Encounter (Signed)
Patient states that Rx sent to pharmacy was for #52 but the pharmacy would only fill #30 d/t insurance regulations.  Patient requesting refill to last her until her appt Oct 6 with [facility]  Spoke with pharmacist at [facility]. Confirmed that medication was only filled for 31 capsules and she is now okay to refill another month.   Pharmacist states that if we need to send Rx for pt Adderall again with more than #31, two Rx can be given to patient. On additional rxs they would need to state in additional comments  " do not fill before *date* "  Rx printed to be signed--enough to last until upcoming appt with CY (#11)  Will send msg to CY as FYI

## 2012-12-09 NOTE — Telephone Encounter (Signed)
Pt was last seen by CDY on 08/13/11.  She does have a pending OV with him on Dec 19, 2012. Per phone msg from 10/25/12, CDY was ok with filling adderall xr 30 mg until Oct OV but she must keep this appt. During that time, adderall xr 30 mg rx was printed for # 52 with no refills. Pt should still have med left to last until OV. lmomtcb to discuss with pt.

## 2012-12-19 ENCOUNTER — Ambulatory Visit (INDEPENDENT_AMBULATORY_CARE_PROVIDER_SITE_OTHER): Payer: 59 | Admitting: Internal Medicine

## 2012-12-19 ENCOUNTER — Encounter: Payer: Self-pay | Admitting: Internal Medicine

## 2012-12-19 VITALS — BP 110/78 | HR 75 | Ht 64.0 in | Wt 159.4 lb

## 2012-12-19 DIAGNOSIS — F17200 Nicotine dependence, unspecified, uncomplicated: Secondary | ICD-10-CM

## 2012-12-19 DIAGNOSIS — G4733 Obstructive sleep apnea (adult) (pediatric): Secondary | ICD-10-CM

## 2012-12-19 DIAGNOSIS — G4710 Hypersomnia, unspecified: Secondary | ICD-10-CM

## 2012-12-19 MED ORDER — METHYLPHENIDATE HCL 10 MG PO TABS: 10.0000 mg | ORAL_TABLET | Freq: Two times a day (BID) | ORAL | Status: DC

## 2012-12-19 MED ORDER — AMPHETAMINE-DEXTROAMPHET ER 30 MG PO CP24: 30.0000 mg | ORAL_CAPSULE | Freq: Every day | ORAL | Status: DC | PRN

## 2012-12-19 NOTE — Assessment & Plan Note (Signed)
Continued encouragement to stop smoking. Nicotine withdrawal at night can contribute to sleep disturbance.

## 2012-12-19 NOTE — Patient Instructions (Addendum)
Order- [facility] Unattended home sleep study     Dx OSA  Scripts- refill Adderall 30XR  And Ritalin 10 mg

## 2012-12-19 NOTE — Assessment & Plan Note (Addendum)
History of snoring and daytime hypersomnia, but without previously demonstrated sleep apnea. Consider idiopathic hypersomnia versus depression. Has been a component of sleep disturbance from chronic leg and hip pain. Plan-recheck for sleep disordered breathing with an unattended home sleep study. We can continue use of Adderall, with Ritalin as an alternative.

## 2012-12-19 NOTE — Progress Notes (Signed)
08/13/11- [name] current smoker followed for hypersomnia with snoring LOV- 08/16/09 Working 6 days per week and using Adderall on work days. Did not take any today-note BP. 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 Not using Ritalin which made her sleepier. Adderall 30 XR 1/ day. No sleep paralysis or cataplexy.  12/19/12- [name] current smoker followed for hypersomnia with snoring FOLLOWS FOR: has not been sleeping well again. Grandson tells her she snores loudly after septoplasty. Bedtime around 9 or 10 PM and wakes frequently, gets up and walks around some before trying to go back to bed. Still takes clonazepam 0.5 mg around 5 or 6 PM. Adderall 30 mg XR works well taken once in the morning 6 days a week. Some days instead she will take Ritalin 10 mg in the morning. Some days she feels sleepy in the daytime and some nights she feels restless, but she hadn't paid attention to which medication she takes on those days. We discussed comparison. [facility] has started gabapentin for restless leg. She continues to smoke against advice. Has had flu shot  ROS-see HPI Constitutional:   No-   weight loss, night sweats, fevers, chills, +continued tofatigue, lassitude. HEENT:   No-  headaches, difficulty swallowing, tooth/dental problems, sore throat,       No-  sneezing, itching, ear ache, nasal congestion, post nasal drip,  CV:  No-   chest pain, orthopnea, PND, swelling in lower extremities, anasarca, dizziness, palpitations Resp: No-   shortness of breath with exertion or at rest.              No-   productive cough,  No non-productive cough,  No- coughing up of blood.              No-   change in color of mucus.  No- wheezing.   Skin: No-   rash or lesions. GI:  No-   heartburn, indigestion, abdominal pain, nausea, vomiting,  GU:  MS:  No-   joint pain or swelling.   Neuro-     nothing unusual Psych:  No- change in mood or affect. No depression or anxiety.  No  memory loss.  OBJ- Physical Exam General- Alert, Oriented, Affect-appropriate, Distress- none acute. Medium build. Skin- rash-none, lesions- none, excoriation- none. + tanned. Lymphadenopathy- none Head- atraumatic            Eyes- Gross vision intact, PERRLA, conjunctivae and secretions clear            Ears- Hearing, canals-normal            Nose- Clear, no-Septal dev, mucus, polyps, erosion, perforation             Throat- Mallampati II , mucosa clear , drainage- none, tonsils- atrophic Neck- flexible , trachea midline, no stridor , thyroid nl, carotid no bruit Chest - symmetrical excursion , unlabored           Heart/CV- RRR , no murmur , no gallop  , no rub, nl s1 s2                           - JVD- none , edema- none, stasis changes- none, varices- none           Lung- clear to P&A, wheeze- none, cough- none , dullness-none, rub- none           Chest wall-  Abd-  Br/  Gen/ Rectal- Not done, not indicated Extrem- cyanosis- none, clubbing, none, atrophy- none, strength- nl Neuro- grossly intact to observation

## 2012-12-27 DIAGNOSIS — G4710 Hypersomnia, unspecified: Secondary | ICD-10-CM

## 2012-12-27 DIAGNOSIS — G4733 Obstructive sleep apnea (adult) (pediatric): Secondary | ICD-10-CM

## 2013-01-03 ENCOUNTER — Encounter: Payer: Self-pay | Admitting: Internal Medicine

## 2013-01-03 DIAGNOSIS — G4733 Obstructive sleep apnea (adult) (pediatric): Secondary | ICD-10-CM

## 2013-02-20 ENCOUNTER — Ambulatory Visit: Payer: 59 | Admitting: Internal Medicine

## 2013-03-06 ENCOUNTER — Ambulatory Visit: Payer: 59 | Admitting: Internal Medicine

## 2013-03-10 ENCOUNTER — Encounter: Payer: Self-pay | Admitting: Internal Medicine

## 2013-04-04 ENCOUNTER — Telehealth: Payer: Self-pay | Admitting: Family Medicine

## 2013-04-04 ENCOUNTER — Other Ambulatory Visit: Payer: Self-pay | Admitting: *Deleted

## 2013-04-04 MED ORDER — HYDROCHLOROTHIAZIDE 12.5 MG PO CAPS: 12.5000 mg | ORAL_CAPSULE | Freq: Every day | ORAL | Status: DC

## 2013-04-04 NOTE — Telephone Encounter (Signed)
That is fine -please give her a month refill-thanks I will seeher then

## 2013-04-04 NOTE — Telephone Encounter (Signed)
Patient Information:  Caller Name: Cale  Phone: [phone]  Patient: Desiree Walters, Desiree Walters  Gender: Female  DOB: 10-10-1970  Age: 43 Years  PCP: [facility] ([facility])  Office Follow Up:  Does the office need to follow up with this patient?: Yes  Instructions For The Office: Transferred to the office to assist with scheduling since patient has not been since 2013. They advised to schedule patient for < 3years.  Patient cannot be seen today or tomorrow due to schedule. PLEASE CONTACT FOR APPT.  RN Note:  Transferred to the office to assist with scheduling since patient has not been since 2013. They advised to schedule patient for < 3years.  Patient cannot be seen today or tomorrow due to schedule. PLEASE CONTACT FOR APPT.  Symptoms  Reason For Call & Symptoms: Patient states she is having "blood pressure episode".  She was [facility] yesterday and blood pressure 150/100.  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  LOV 04/08/2011.  Reviewed Health History In EMR: Yes  Reviewed Medications In EMR: Yes  Reviewed Allergies In EMR: Yes  Reviewed Surgeries / Procedures: Yes  Date of Onset of Symptoms: 04/03/2013  Treatments Tried: HCTZ  Treatments Tried Worked: No  Guideline(s) Used:  High Blood Pressure  Disposition Per Guideline:   See Within 2 Weeks in Office  Reason For Disposition Reached:   BP > 140/90 and is taking BP medications  Advice Given:  General:  Untreated high blood pressure may cause damage to the heart, brain, kidneys, and eyes.  Treatment of high blood pressure can reduce the risk of stroke, heart attack, and heart failure.  The goal of blood pressure treatment for most patients with hypertension is to keep the blood pressure under 140/90.  Lifestyle Changes  Maintain a healthy weight. Lose weight if you are overweight.  Do 30 minutes of aerobic physical  activity (e.g., brisk walking) most days of the week.  Eat a diet high in fresh fruits and low-fat dairy products. Limit your intake of saturated and total fat. Choose foods that are lower in salt.  If you smoke, you should stop.  If you drink alcohol, you should limit your daily alcohol drinking. Women should have no more than one drink per day. Men should have no more than 2 drinks per day. A drink is defined as 1.5 oz hard liquor (one shot or jigger; 45 ml), 5 oz wine (small glass; 150 ml), or 12 oz beer (one can; 360 ml).  Call Back If:  Headache, blurred vision, difficulty talking, or difficulty walking occurs  Chest pain or difficulty breathing occurs  You want to go in to the office for a blood pressure check  You become worse.  RN Overrode Recommendation:  Make Appointment  Transferred to the office to assist with scheduling since patient has not been since 2013. They advised to schedule patient for < 3years.  Patient cannot be seen today or tomorrow due to schedule. PLEASE CONTACT FOR APPT.

## 2013-04-04 NOTE — Telephone Encounter (Signed)
Please call and schedule her appt for this or next week -thanks

## 2013-04-04 NOTE — Telephone Encounter (Signed)
Pt scheduled appt. (6min) on Monday 04/10/13 but she is out of her HCTZ 12.5 Rx today and wanted to know if you could refill Rx until she is seen, pt is afraid since her BP is already high that if she misses a dose of HCTZ that her BP will go up really high, please advise  Pt uses [facility]

## 2013-04-10 ENCOUNTER — Encounter: Payer: Self-pay | Admitting: Family Medicine

## 2013-04-10 ENCOUNTER — Ambulatory Visit (INDEPENDENT_AMBULATORY_CARE_PROVIDER_SITE_OTHER): Payer: 59 | Admitting: Family Medicine

## 2013-04-10 VITALS — BP 140/86 | HR 85 | Temp 98.8°F | Wt 160.5 lb

## 2013-04-10 DIAGNOSIS — Z1231 Encounter for screening mammogram for malignant neoplasm of breast: Secondary | ICD-10-CM

## 2013-04-10 DIAGNOSIS — R7309 Other abnormal glucose: Secondary | ICD-10-CM

## 2013-04-10 DIAGNOSIS — E538 Deficiency of other specified B group vitamins: Secondary | ICD-10-CM

## 2013-04-10 DIAGNOSIS — F17200 Nicotine dependence, unspecified, uncomplicated: Secondary | ICD-10-CM

## 2013-04-10 DIAGNOSIS — E785 Hyperlipidemia, unspecified: Secondary | ICD-10-CM

## 2013-04-10 DIAGNOSIS — I10 Essential (primary) hypertension: Secondary | ICD-10-CM

## 2013-04-10 LAB — CBC WITH DIFFERENTIAL/PLATELET
Basophils Absolute: 0 10*3/uL (ref 0.0–0.1)
Basophils Relative: 0.4 % (ref 0.0–3.0)
Eosinophils Absolute: 0.1 10*3/uL (ref 0.0–0.7)
Eosinophils Relative: 1.5 % (ref 0.0–5.0)
HCT: 46 % (ref 36.0–46.0)
Hemoglobin: 15.6 g/dL — ABNORMAL HIGH (ref 12.0–15.0)
Lymphocytes Relative: 24 % (ref 12.0–46.0)
Lymphs Abs: 2 10*3/uL (ref 0.7–4.0)
MCHC: 34 g/dL (ref 30.0–36.0)
MCV: 86.9 fl (ref 78.0–100.0)
Monocytes Absolute: 0.7 10*3/uL (ref 0.1–1.0)
Monocytes Relative: 8.6 % (ref 3.0–12.0)
Neutro Abs: 5.5 10*3/uL (ref 1.4–7.7)
Neutrophils Relative %: 65.5 % (ref 43.0–77.0)
Platelets: 178 10*3/uL (ref 150.0–400.0)
RBC: 5.3 Mil/uL — ABNORMAL HIGH (ref 3.87–5.11)
RDW: 13.3 % (ref 11.5–14.6)
WBC: 8.3 10*3/uL (ref 4.5–10.5)

## 2013-04-10 LAB — COMPREHENSIVE METABOLIC PANEL
ALT: 18 U/L (ref 0–35)
AST: 19 U/L (ref 0–37)
Albumin: 4.3 g/dL (ref 3.5–5.2)
Alkaline Phosphatase: 48 U/L (ref 39–117)
BUN: 14 mg/dL (ref 6–23)
CO2: 27 mEq/L (ref 19–32)
Calcium: 9.5 mg/dL (ref 8.4–10.5)
Chloride: 103 mEq/L (ref 96–112)
Creatinine, Ser: 0.7 mg/dL (ref 0.4–1.2)
GFR: 91.4 mL/min (ref >60.00)
Glucose, Bld: 90 mg/dL (ref 70–99)
Potassium: 4 mEq/L (ref 3.5–5.1)
Sodium: 137 mEq/L (ref 135–145)
Total Bilirubin: 0.6 mg/dL (ref 0.3–1.2)
Total Protein: 7.2 g/dL (ref 6.0–8.3)

## 2013-04-10 LAB — LIPID PANEL
Cholesterol: 220 mg/dL — ABNORMAL HIGH (ref 0–200)
HDL: 45.7 mg/dL (ref >39.00)
Total CHOL/HDL Ratio: 5
Triglycerides: 154 mg/dL — ABNORMAL HIGH (ref 0.0–149.0)
VLDL: 30.8 mg/dL (ref 0.0–40.0)

## 2013-04-10 LAB — TSH: TSH: 0.35 u[IU]/mL (ref 0.35–5.50)

## 2013-04-10 LAB — LDL CHOLESTEROL, DIRECT: Direct LDL: 155.1 mg/dL

## 2013-04-10 LAB — VITAMIN B12: Vitamin B-12: 264 pg/mL (ref 211–911)

## 2013-04-10 MED ORDER — METOPROLOL SUCCINATE ER 25 MG PO TB24: 25.0000 mg | ORAL_TABLET | Freq: Every day | ORAL | Status: DC

## 2013-04-10 MED ORDER — HYDROCHLOROTHIAZIDE 25 MG PO TABS: 25.0000 mg | ORAL_TABLET | Freq: Every day | ORAL | Status: DC

## 2013-04-10 NOTE — Patient Instructions (Addendum)
Stay on hctz 25 mg once daily  Add metoprolol 1 pill in evening  If any problems let me know  Keep thinking about quitting smoking Labs today  Follow up with me in 2-3 weeks Stop up front for mammogram referral    DASH Diet The DASH diet stands for "Dietary Approaches to Stop Hypertension." It is a healthy eating plan that has been shown to reduce high blood pressure (hypertension) in as little as 14 days, while also possibly providing other significant health benefits. These other health benefits include reducing the risk of breast cancer after menopause and reducing the risk of type 2 diabetes, heart disease, colon cancer, and stroke. Health benefits also include weight loss and slowing kidney failure in patients with chronic kidney disease.  DIET GUIDELINES  Limit salt (sodium). Your diet should contain less than 1500 mg of sodium daily.  Limit refined or processed carbohydrates. Your diet should include mostly whole grains. Desserts and added sugars should be used sparingly.  Include small amounts of heart-healthy fats. These types of fats include nuts, oils, and tub margarine. Limit saturated and trans fats. These fats have been shown to be harmful in the body. CHOOSING FOODS  The following food groups are based on a 2000 calorie diet. See your Registered Dietitian for individual calorie needs. Grains and Grain Products (6 to 8 servings daily)  Eat More Often: Whole-wheat bread, brown rice, whole-grain or wheat pasta, quinoa, popcorn without added fat or salt (air popped).  Eat Less Often: White bread, white pasta, white rice, cornbread. Vegetables (4 to 5 servings daily)  Eat More Often: Fresh, frozen, and canned vegetables. Vegetables may be raw, steamed, roasted, or grilled with a minimal amount of fat.  Eat Less Often/Avoid: Creamed or fried vegetables. Vegetables in a cheese sauce. Fruit (4 to 5 servings daily)  Eat More Often: All fresh, canned (in natural juice), or frozen  fruits. Dried fruits without added sugar. One hundred percent fruit juice ( cup [237 mL] daily).  Eat Less Often: Dried fruits with added sugar. Canned fruit in light or heavy syrup. [employer], Fish, and Poultry (2 servings or less daily. One serving is 3 to 4 oz [85-114 g]).  Eat More Often: Ninety percent or leaner ground beef, tenderloin, sirloin. Round cuts of beef, chicken breast, [place] breast. All fish. Grill, bake, or broil your meat. Nothing should be fried.  Eat Less Often/Avoid: Fatty cuts of meat, [place], or chicken leg, thigh, or wing. Fried cuts of meat or fish. Dairy (2 to 3 servings)  Eat More Often: Low-fat or fat-free milk, low-fat plain or light yogurt, reduced-fat or part-skim cheese.  Eat Less Often/Avoid: Milk (whole, 2%).Whole milk yogurt. Full-fat cheeses. Nuts, Seeds, and Legumes (4 to 5 servings per week)  Eat More Often: All without added salt.  Eat Less Often/Avoid: Salted nuts and seeds, canned beans with added salt. Fats and Sweets (limited)  Eat More Often: Vegetable oils, tub margarines without trans fats, sugar-free gelatin. Mayonnaise and salad dressings.  Eat Less Often/Avoid: Coconut oils, palm oils, butter, stick margarine, cream, half and half, cookies, candy, pie. FOR MORE INFORMATION The Dash Diet Eating Plan: www.dashdiet.org Document Released: 02/19/2011 Document Revised: 05/25/2011 Document Reviewed: 02/19/2011 [facility] Patient Information 2014 [place], [place].

## 2013-04-10 NOTE — Assessment & Plan Note (Signed)
Disc in detail risks of smoking and possible outcomes including copd, vascular/ heart disease, cancer , respiratory and sinus infections  Pt voices understanding Pt not ready to quit yet  May consider use of a nicotine supplement

## 2013-04-10 NOTE — Assessment & Plan Note (Signed)
Will refer for first annual screening mam

## 2013-04-10 NOTE — Progress Notes (Signed)
Subjective:    Patient ID: Desiree Walters, female    DOB: 05-10-1970, 43 y.o.   MRN: [id number]  HPI Here to get re est   Does not have a psychiatrist   Takes ritalin and adderall  -for narcolepsy- Dr Annamaria Boots with pulmonary [facility] -- Dr Greta Doom  - klonopin and gabapentin and norco   Works at [employer] - and sees provider there occ/ and works also at [employer] - 2 jobs and busy lifestyle   Having issues with bp  Last Sunday at work - had some floaters / did not feel well - 150/105 at pain man office  Normally runs around 140/90  Stress- 2 jobs 6 d per week   Doubled up on her hctz to 25 total mg daily-that has helped    Not exercising right now - had a knee reconstruction / articular cartilage repair- gives her trouble  Will need a knee replacement - is not ready to do it yet  It is a lifestyle program   Does not eat a very healthy diet -too much salt   Smoking - is still smoking 1ppd  Not totally ready to quit Intol of chantix  Did buy a vapor cig to try   Patient Active Problem List   Diagnosis Date Noted  . Other screening mammogram 04/10/2013  . VITAMIN B12 DEFICIENCY 10/09/2009  . HYPERLIPIDEMIA 10/09/2009  . HYPERGLYCEMIA 10/09/2009  . RHINITIS 05/10/2008  . HYPERSOMNIA 09/26/2007  . MENORRHAGIA 11/10/2006  . HEADACHE 11/10/2006  . SNORING, HX OF 08/20/2006  . TOBACCO ABUSE 08/04/2006  . HYPERTENSION, ESSENTIAL NOS 08/04/2006  . OBSESSIVE-COMPULSIVE DISORDER 07/26/2006  . FIBROMYALGIA 07/26/2006   Past Medical History  Diagnosis Date  . Tobacco abuse   . OCD (obsessive compulsive disorder)   . Hyperlipemia   . Hypersomnia     NPSG 08-25-07-1.2/hr  . Pernicious anemia   . Diabetes mellitus   . Obesity   . Fibromyalgia    Past Surgical History  Procedure Laterality Date  . Foot surgery  2005; 2008  . Nasal septum surgery    . Abdominal hysterectomy    . Iud - merina  2008   History  Substance Use Topics  . Smoking status:  Current Every Day Smoker -- 1.00 packs/day for 20 years    Types: Cigarettes  . Smokeless tobacco: Not on file  . Alcohol Use: Not on file   Family History  Problem Relation Age of Onset  . Hypertension Father   . Coronary artery disease Father   . Sleep apnea Father     with CPAP  . Heart disease Father     CAD  . Hyperlipidemia Father   . Hypertension Mother   . Hyperlipidemia Mother   . Hypertension Sister   . Hyperlipidemia Other     aunts/uncles  . Coronary artery disease Other     aunts/uncles   Allergies  Allergen Reactions  . Ciprofloxacin     REACTION: reaction not known  . Levofloxacin     REACTION: reaction not known  . Varenicline Tartrate     REACTION: headache   Current Outpatient Prescriptions on File Prior to Visit  Medication Sig Dispense Refill  . amphetamine-dextroamphetamine (ADDERALL XR) 30 MG 24 hr capsule Take 1 capsule (30 mg total) by mouth daily as needed.  30 capsule  0  . clonazePAM (KLONOPIN) 0.5 MG tablet Take 1 tablet by mouth at bedtime.      . cyanocobalamin (,VITAMIN  B-12,) 1000 MCG/ML injection Inject 1 mL (1,000 mcg total) into the muscle every 30 (thirty) days.  1 mL  6  . gabapentin (NEURONTIN) 300 MG capsule Take 1 capsule by mouth daily.      Marland Kitchen HYDROcodone-acetaminophen (NORCO) 10-325 MG per tablet Take 1 tablet by mouth every 4 (four) hours as needed.        . methylphenidate (RITALIN) 10 MG tablet Take 1 tablet (10 mg total) by mouth 2 (two) times daily.  60 tablet  0   No current facility-administered medications on file prior to visit.     Review of Systems Review of Systems  Constitutional: Negative for fever, appetite change, and unexpected weight change. pos for fatigue Eyes: Negative for pain and visual disturbance.  Respiratory: Negative for cough and shortness of breath.   Cardiovascular: Negative for cp and pos for occ palpitations , neg for pedal edema or orthopnea or PND    Gastrointestinal: Negative for nausea,  diarrhea and constipation.  Genitourinary: Negative for urgency and frequency.  Skin: Negative for pallor or rash   Neurological: Negative for weakness, light-headedness, numbness and headaches.  Hematological: Negative for adenopathy. Does not bruise/bleed easily.  Psychiatric/Behavioral: Negative for dysphoric mood. The patient is not nervous/anxious.  (mood has been fairly good)       Objective:   Physical Exam  Constitutional: She appears well-developed and well-nourished. No distress.  HENT:  Head: Normocephalic and atraumatic.  Right Ear: External ear normal.  Left Ear: External ear normal.  Nose: Nose normal.  Mouth/Throat: Oropharynx is clear and moist.  Eyes: Conjunctivae and EOM are normal. Pupils are equal, round, and reactive to light. Right eye exhibits no discharge. Left eye exhibits no discharge. No scleral icterus.  Neck: Normal range of motion. Neck supple. No JVD present. No thyromegaly present.  Cardiovascular: Normal rate, regular rhythm, normal heart sounds and intact distal pulses.  Exam reveals no gallop.   Pulmonary/Chest: Effort normal and breath sounds normal. No respiratory distress. She has no wheezes. She has no rales.  Abdominal: Soft. Bowel sounds are normal. She exhibits no distension and no mass. There is no tenderness.  Musculoskeletal: She exhibits no edema and no tenderness.  Lymphadenopathy:    She has no cervical adenopathy.  Neurological: She is alert. She has normal reflexes. No cranial nerve deficit. She exhibits normal muscle tone. Coordination normal.  Skin: Skin is warm and dry. No rash noted. No erythema. No pallor.  Tanned   Psychiatric: She has a normal mood and affect.          Assessment & Plan:

## 2013-04-10 NOTE — Assessment & Plan Note (Signed)
Labs today  Rev low sat fat diet

## 2013-04-10 NOTE — Assessment & Plan Note (Signed)
bp has been high despite recent inc in her hctz Overdue for labs  Health habits are not optimal and she smokes  Will continue hctz 25 and add metoprolol ER 25 mg daily- watching pulse and bp carefully (this may also help her palpitations(  Disc lifstyle change with low sodium diet and exercise   Handout given on DASH diet

## 2013-04-10 NOTE — Assessment & Plan Note (Signed)
A1C today Disc need for exercise and low glycemic diet- reviewed  Disc risks of DM

## 2013-04-10 NOTE — Assessment & Plan Note (Signed)
Level today and advise

## 2013-04-19 ENCOUNTER — Telehealth: Payer: Self-pay | Admitting: Family Medicine

## 2013-04-19 NOTE — Telephone Encounter (Signed)
Relevant patient education assigned to patient using Emmi. ° °

## 2013-04-24 ENCOUNTER — Ambulatory Visit: Payer: 59 | Admitting: Family Medicine

## 2013-05-08 ENCOUNTER — Ambulatory Visit: Payer: 59 | Admitting: Family Medicine

## 2013-05-08 DIAGNOSIS — Z0289 Encounter for other administrative examinations: Secondary | ICD-10-CM

## 2013-05-24 ENCOUNTER — Telehealth: Payer: Self-pay | Admitting: Internal Medicine

## 2013-05-24 NOTE — Telephone Encounter (Signed)
LMTCB   Pt is overdue for appt based on last OV and needs to [facility] this asap. We need to review her sleep study with her as well. Will need to get approval from CY to give refill; he will be back tomorrow morning to advise.

## 2013-05-25 MED ORDER — METHYLPHENIDATE HCL 10 MG PO TABS: 10.0000 mg | ORAL_TABLET | Freq: Two times a day (BID) | ORAL | Status: DC

## 2013-05-25 MED ORDER — AMPHETAMINE-DEXTROAMPHET ER 30 MG PO CP24: 30.0000 mg | ORAL_CAPSULE | Freq: Every day | ORAL | Status: DC | PRN

## 2013-05-25 NOTE — Telephone Encounter (Signed)
Spoke with the pt and notified ok x 1 only and must keep ov for additional rf  She verbalized understanding  Rxs on CDY's cart to sign

## 2013-05-25 NOTE — Telephone Encounter (Signed)
Pt made appt for 06/26/13. She is wanting to pick up RX for adderall 30 mg (Last refilled 12/19/12) and ritalin 10 mg (refilled 12/19/12) Please advise Dr. Annamaria Boots thanks

## 2013-05-25 NOTE — Telephone Encounter (Signed)
Ok to refill this time, but if she doesn't make f/u appointment she can keep, we will have to cut off her scripts for these controlled drugs. Marland Kitchen

## 2013-06-08 ENCOUNTER — Other Ambulatory Visit: Payer: Self-pay | Admitting: *Deleted

## 2013-06-08 MED ORDER — METOPROLOL SUCCINATE ER 25 MG PO TB24: 25.0000 mg | ORAL_TABLET | Freq: Every day | ORAL | Status: DC

## 2013-06-08 MED ORDER — HYDROCHLOROTHIAZIDE 25 MG PO TABS: 25.0000 mg | ORAL_TABLET | Freq: Every day | ORAL | Status: DC

## 2013-06-26 ENCOUNTER — Encounter: Payer: Self-pay | Admitting: Internal Medicine

## 2013-06-26 ENCOUNTER — Ambulatory Visit (INDEPENDENT_AMBULATORY_CARE_PROVIDER_SITE_OTHER): Payer: 59 | Admitting: Internal Medicine

## 2013-06-26 VITALS — BP 122/60 | HR 75 | Ht 64.0 in | Wt 161.6 lb

## 2013-06-26 DIAGNOSIS — Z9189 Other specified personal risk factors, not elsewhere classified: Secondary | ICD-10-CM

## 2013-06-26 DIAGNOSIS — F17200 Nicotine dependence, unspecified, uncomplicated: Secondary | ICD-10-CM

## 2013-06-26 DIAGNOSIS — G4710 Hypersomnia, unspecified: Secondary | ICD-10-CM

## 2013-06-26 DIAGNOSIS — G4733 Obstructive sleep apnea (adult) (pediatric): Secondary | ICD-10-CM

## 2013-06-26 NOTE — Progress Notes (Signed)
08/13/11- 32 yoF current smoker followed for hypersomnia with snoring LOV- 08/16/09 Working 6 days per week and using Adderall on work days. Did not take any today-note BP. 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 Not using Ritalin which made her sleepier. Adderall 30 XR 1/ day. No sleep paralysis or cataplexy.  12/19/12- 72 yoF current smoker followed for hypersomnia with snoring FOLLOWS FOR: has not been sleeping well again. Grandson tells her she snores loudly after septoplasty. Bedtime around 9 or 10 PM and wakes frequently, gets up and walks around some before trying to go back to bed. Still takes clonazepam 0.5 mg around 5 or 6 PM. Adderall 30 mg XR works well taken once in the morning 6 days a week. Some days instead she will take Ritalin 10 mg in the morning. Some days she feels sleepy in the daytime and some nights she feels restless, but she hadn't paid attention to which medication she takes on those days. We discussed comparison. [facility] has started gabapentin for restless leg. She continues to smoke against advice. Has had flu shot  06/26/13- 42 yoF current smoker followed for hypersomnia with snoring FOLLOWS FOR: review home sleep study. Has continued use of stimulant meds as needed/ prescribed. Yolanda Bonine tells her she is snoring loudly. We wanted to re-visit question of OSA, hoping to find basis for more specific management than stimulants alone. Unattended home sleep study [facility]) 27/78/24- confirms mild obstructive sleep apnea, AHI 11/ hr w/ desat to 53% on room air, weight 159 lbs. We reviewed medical concerns, sleep hygiene, and compared CPAP with oral appliance therapy. She would be a good candidate for oral appliance and chooses this approach.   ROS-see HPI Constitutional:   No-   weight loss, night sweats, fevers, chills, +fatigue, lassitude. HEENT:   No-  headaches, difficulty swallowing, tooth/dental problems, sore throat,       No-   sneezing, itching, ear ache, nasal congestion, post nasal drip,  CV:  No-   chest pain, orthopnea, PND, swelling in lower extremities, anasarca, dizziness, palpitations Resp: No-   shortness of breath with exertion or at rest.              No-   productive cough,  No non-productive cough,  No- coughing up of blood.              No-   change in color of mucus.  No- wheezing.   Skin: No-   rash or lesions. GI:  No-   heartburn, indigestion, abdominal pain, nausea, vomiting,  GU:  MS:  No-   joint pain or swelling.   Neuro-     nothing unusual Psych:  No- change in mood or affect. No depression or anxiety.  No memory loss.  OBJ- Physical Exam General- Alert, Oriented, Affect-appropriate, Distress- none acute. Medium build. Skin- rash-none, lesions- none, excoriation- none. + tanned/ sunburned. Lymphadenopathy- none Head- atraumatic            Eyes- Gross vision intact, PERRLA, conjunctivae and secretions clear            Ears- Hearing, canals-normal            Nose- Clear, no-Septal dev, mucus, polyps, erosion, perforation             Throat- Mallampati II , mucosa clear , drainage- none, tonsils- atrophic Neck- flexible , trachea midline, no stridor , thyroid nl, carotid no bruit Chest - symmetrical excursion ,  unlabored           Heart/CV- RRR , no murmur , no gallop  , no rub, nl s1 s2                           - JVD- none , edema- none, stasis changes- none, varices- none           Lung- clear to P&A, wheeze- none, cough- none , dullness-none, rub- none           Chest wall-  Abd-  Br/ Gen/ Rectal- Not done, not indicated Extrem- cyanosis- none, clubbing, none, atrophy- none, strength- nl Neuro- grossly intact to observation

## 2013-06-26 NOTE — Assessment & Plan Note (Signed)
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 Plan- treat OSA, again review sleep hygiene and appropriate use of stimulants- adderall/ ritalin

## 2013-06-26 NOTE — Patient Instructions (Signed)
Order- referral to Dr Kalman Shan   Dx OSA  Consider oral appliance  Ok to call for med refills as needed

## 2013-06-26 NOTE — Assessment & Plan Note (Signed)
We will be watching to see if treatment for OSA resolves or reduces need for stimulant meds.  After appropriate review of options, she chooses to look at oral appliance option. Plan referral for consideration of oral appliance

## 2013-06-26 NOTE — Assessment & Plan Note (Signed)
Counseled

## 2013-07-04 ENCOUNTER — Telehealth: Payer: Self-pay | Admitting: Internal Medicine

## 2013-07-04 MED ORDER — AMPHETAMINE-DEXTROAMPHET ER 30 MG PO CP24: 30.0000 mg | ORAL_CAPSULE | Freq: Every day | ORAL | Status: DC | PRN

## 2013-07-04 MED ORDER — METHYLPHENIDATE HCL 10 MG PO TABS: 10.0000 mg | ORAL_TABLET | Freq: Two times a day (BID) | ORAL | Status: DC

## 2013-07-04 NOTE — Telephone Encounter (Signed)
Rx's printed for CDY to sign and placed on cart. Please advise when ready for p/u thanks

## 2013-07-04 NOTE — Telephone Encounter (Signed)
Pt aware that Rx's at front for pick up. Nothing more needed at this time.

## 2013-12-29 ENCOUNTER — Other Ambulatory Visit: Payer: Self-pay

## 2014-05-09 ENCOUNTER — Encounter: Payer: Self-pay | Admitting: Family Medicine

## 2014-05-09 ENCOUNTER — Ambulatory Visit (INDEPENDENT_AMBULATORY_CARE_PROVIDER_SITE_OTHER): Payer: 59 | Admitting: Family Medicine

## 2014-05-09 VITALS — BP 122/72 | HR 72 | Temp 98.2°F | Ht 64.0 in | Wt 173.8 lb

## 2014-05-09 DIAGNOSIS — Z1231 Encounter for screening mammogram for malignant neoplasm of breast: Secondary | ICD-10-CM | POA: Insufficient documentation

## 2014-05-09 DIAGNOSIS — N951 Menopausal and female climacteric states: Secondary | ICD-10-CM

## 2014-05-09 DIAGNOSIS — F17200 Nicotine dependence, unspecified, uncomplicated: Secondary | ICD-10-CM

## 2014-05-09 DIAGNOSIS — Z720 Tobacco use: Secondary | ICD-10-CM

## 2014-05-09 NOTE — Progress Notes (Signed)
Subjective:    Patient ID: Desiree Walters, female    DOB: 06-Mar-1971, 44 y.o.   MRN: [id number]  HPI Here with vasomotor menopausal symptoms   Hysterectomy for ovarian problem at age 25  Partial - still has ovaries  Is going through menopause early   Estradiol is low - less thant 11.8 FSH is 69.1  LH was 39.7   Really bad hot flashes day and night - much worse at night (7-8)  Not getting much sleep- 6-7 hours  No mood symptoms  No vaginal dryness Has fatigue  Has wt gain - up 12 lb bmi of 29    Not exercising (working too much)  Eating fair - low fat / grilled chicken   Not seeing gyn- does not need paps any more  Needs a mammogram   Needs a referral for that  She has breast implants  She has not noticed any lumps   Smoking status -about 1 ppd  No quit date or intention  She tried chantix and it caused severe mood disorder     Cholesterol 282 Trig 179 HDL 71  LDL 175 high   Lab Results  Component Value Date   CHOL 220* 04/10/2013   HDL 45.70 04/10/2013   LDLDIRECT 155.1 04/10/2013   TRIG 154.0* 04/10/2013   CHOLHDL 5 04/10/2013    Does not eat much in terms of fatty food but can do better   Just quit prozac again due to weight gain  Has been on ssri   She has tried black Benay Pike- unsure if it works yet   Patient Active Problem List   Diagnosis Date Noted  . Other screening mammogram 04/10/2013  . VITAMIN B12 DEFICIENCY 10/09/2009  . HYPERLIPIDEMIA 10/09/2009  . HYPERGLYCEMIA 10/09/2009  . RHINITIS 05/10/2008  . HYPERSOMNIA 09/26/2007  . MENORRHAGIA 11/10/2006  . HEADACHE 11/10/2006  . Obstructive sleep apnea 08/20/2006  . TOBACCO ABUSE 08/04/2006  . HYPERTENSION, ESSENTIAL NOS 08/04/2006  . OBSESSIVE-COMPULSIVE DISORDER 07/26/2006  . FIBROMYALGIA 07/26/2006   Past Medical History  Diagnosis Date  . Tobacco abuse   . OCD (obsessive compulsive disorder)   . Hyperlipemia   . Hypersomnia     NPSG 08-25-07-1.2/hr  . Pernicious anemia   .  Diabetes mellitus   . Obesity   . Fibromyalgia    Past Surgical History  Procedure Laterality Date  . Foot surgery  2005; 2008  . Nasal septum surgery    . Abdominal hysterectomy    . Iud - merina  2008   History  Substance Use Topics  . Smoking status: Current Every Day Smoker -- 1.00 packs/day for 20 years    Types: Cigarettes  . Smokeless tobacco: Not on file  . Alcohol Use: Not on file   Family History  Problem Relation Age of Onset  . Hypertension Father   . Coronary artery disease Father   . Sleep apnea Father     with CPAP  . Heart disease Father     CAD  . Hyperlipidemia Father   . Hypertension Mother   . Hyperlipidemia Mother   . Hypertension Sister   . Hyperlipidemia Other     aunts/uncles  . Coronary artery disease Other     aunts/uncles   Allergies  Allergen Reactions  . Ciprofloxacin     REACTION: reaction not known  . Levofloxacin     REACTION: reaction not known  . Varenicline Tartrate     REACTION: headache   Current Outpatient Prescriptions  on File Prior to Visit  Medication Sig Dispense Refill  . clonazePAM (KLONOPIN) 0.5 MG tablet Take 1 tablet by mouth at bedtime.    . hydrochlorothiazide (HYDRODIURIL) 25 MG tablet Take 1 tablet (25 mg total) by mouth daily. 90 tablet 3  . HYDROcodone-acetaminophen (NORCO) 10-325 MG per tablet Take 1 tablet by mouth every 4 (four) hours as needed.      . metoprolol succinate (TOPROL-XL) 25 MG 24 hr tablet Take 1 tablet (25 mg total) by mouth daily. 90 tablet 3   No current facility-administered medications on file prior to visit.      Review of Systems Review of Systems  Constitutional: Negative for fever, appetite change, and unexpected weight change.  Eyes: Negative for pain and visual disturbance.  Respiratory: Negative for cough and shortness of breath.   Cardiovascular: Negative for cp or palpitations    Gastrointestinal: Negative for nausea, diarrhea and constipation.  Genitourinary: Negative for  urgency and frequency.  Skin: Negative for pallor or rash   Endo: pos for severe hot flashes and night sweats  Neurological: Negative for weakness, light-headedness, numbness and headaches.  Hematological: Negative for adenopathy. Does not bruise/bleed easily.  Psychiatric/Behavioral: Negative for dysphoric mood. The patient is not nervous/anxious.         Objective:   Physical Exam  Constitutional: She appears well-developed and well-nourished. No distress.  obese and well appearing   HENT:  Head: Normocephalic and atraumatic.  Mouth/Throat: Oropharynx is clear and moist.  Eyes: Conjunctivae and EOM are normal. Pupils are equal, round, and reactive to light. No scleral icterus.  Neck: Normal range of motion. Neck supple. No JVD present. Carotid bruit is not present.  Cardiovascular: Normal rate, regular rhythm and normal heart sounds.   Pulmonary/Chest: Breath sounds normal. No respiratory distress. She has no wheezes. She has no rales.  Diffusely distant bs   Lymphadenopathy:    She has no cervical adenopathy.  Neurological: She is alert.  Skin: Skin is warm and dry. No rash noted. No pallor.  Psychiatric: She has a normal mood and affect.          Assessment & Plan:   Problem List Items Addressed This Visit      Other   Encounter for screening mammogram for breast cancer    Ref for first screening mammogram Scheduled annual screening mammogram Nl breast exam today  Encouraged monthly self exams        Relevant Orders   MM DIGITAL SCREENING BILATERAL   Menopausal syndrome (hot flashes)    Disc symptoms in detail  Disc HRT and risks , would not px to a smoker due to further inc blood clot risk as well  Understands inc risk of breast cancer with this  Would be the most eff tx however If pt quits smoking -she may ask to begin estrogen for severe vasomotor symptoms   Rev labs from [facility] today as well       TOBACCO ABUSE - Primary    Disc in detail  risks of smoking and possible outcomes including copd, vascular/ heart disease, cancer , respiratory and sinus infections, and blood clots  Pt voices understanding Also understands that I would not px HRT to anyone who smokes due to further inc blood clot risk

## 2014-05-09 NOTE — Patient Instructions (Signed)
For menopausal hot flashes - you can try Preston Fleeting over the counter  For severe symptoms -if you quit smoking entirely -we can try HRT (estrogen)- with the understanding that it can increase your risk of breast cancer  If you want to try a low dose of paxil to see if it helps-let me know Try to take care of yourself and eat a healthy diet and drink lots of water   Let me know if you successfully quit smoking and want to try estrogen - please let me know    Stop at check out for referral for mammogram   For cholesterol (Avoid red meat/ fried foods/ egg yolks/ fatty breakfast meats/ butter, cheese and high fat dairy/ and shellfish )

## 2014-05-10 NOTE — Assessment & Plan Note (Signed)
Disc in detail risks of smoking and possible outcomes including copd, vascular/ heart disease, cancer , respiratory and sinus infections, and blood clots  Pt voices understanding Also understands that I would not px HRT to anyone who smokes due to further inc blood clot risk

## 2014-05-10 NOTE — Assessment & Plan Note (Signed)
Ref for first screening mammogram Scheduled annual screening mammogram Nl breast exam today  Encouraged monthly self exams

## 2014-05-10 NOTE — Assessment & Plan Note (Signed)
Disc symptoms in detail  Disc HRT and risks , would not px to a smoker due to further inc blood clot risk as well  Understands inc risk of breast cancer with this  Would be the most eff tx however If pt quits smoking -she may ask to begin estrogen for severe vasomotor symptoms   Rev labs from [facility] today as well

## 2014-06-06 ENCOUNTER — Other Ambulatory Visit: Payer: Self-pay | Admitting: Family Medicine

## 2014-07-02 ENCOUNTER — Encounter: Payer: Self-pay | Admitting: Internal Medicine

## 2014-07-02 ENCOUNTER — Ambulatory Visit (INDEPENDENT_AMBULATORY_CARE_PROVIDER_SITE_OTHER): Payer: 59 | Admitting: Internal Medicine

## 2014-07-02 VITALS — BP 122/74 | HR 79 | Ht 64.0 in | Wt 171.0 lb

## 2014-07-02 DIAGNOSIS — F17200 Nicotine dependence, unspecified, uncomplicated: Secondary | ICD-10-CM

## 2014-07-02 DIAGNOSIS — Z720 Tobacco use: Secondary | ICD-10-CM

## 2014-07-02 DIAGNOSIS — G4733 Obstructive sleep apnea (adult) (pediatric): Secondary | ICD-10-CM | POA: Diagnosis not present

## 2014-07-02 NOTE — Progress Notes (Signed)
08/13/11- 42 yoF current smoker followed for hypersomnia with snoring LOV- 08/16/09 Working 6 days per week and using Adderall on work days. Did not take any today-note BP. 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 Not using Ritalin which made her sleepier. Adderall 30 XR 1/ day. No sleep paralysis or cataplexy.  12/19/12- 77 yoF current smoker followed for hypersomnia with snoring FOLLOWS FOR: has not been sleeping well again. Grandson tells her she snores loudly after septoplasty. Bedtime around 9 or 10 PM and wakes frequently, gets up and walks around some before trying to go back to bed. Still takes clonazepam 0.5 mg around 5 or 6 PM. Adderall 30 mg XR works well taken once in the morning 6 days a week. Some days instead she will take Ritalin 10 mg in the morning. Some days she feels sleepy in the daytime and some nights she feels restless, but she hadn't paid attention to which medication she takes on those days. We discussed comparison. [facility] gabapentin for restless leg. She continues to smoke against advice. Has had flu shot  06/26/13- 42 yoF current smoker followed for hypersomnia with snoring FOLLOWS FOR: review home sleep study. Has continued use of stimulant meds as needed/ prescribed. Yolanda Bonine tells her she is snoring loudly. We wanted to re-visit question of OSA, hoping to find basis for more specific management than stimulants alone. Unattended home sleep study [facility] Clap) 72/53/66- confirms mild obstructive sleep apnea, AHI 11/ hr w/ desat to 53% on room air, weight 159 lbs. We reviewed medical concerns, sleep hygiene, and compared CPAP with oral appliance therapy. She would be a good candidate for oral appliance and chooses this approach.   07/02/14- 74 yoF current smoker followed for hypersomnia with snoring FOLLOWS FOR: Pt did not get oral appliance from Dr Oswaldo Done for the oral appliance did not work; pt is up for trying the CPAP  out. We can start with autotitration Still not trying to stop smoking  ROS-see HPI Constitutional:   No-   weight loss, night sweats, fevers, chills, +fatigue, lassitude. HEENT:   No-  headaches, difficulty swallowing, tooth/dental problems, sore throat,       No-  sneezing, itching, ear ache, nasal congestion, post nasal drip,  CV:  No-   chest pain, orthopnea, PND, swelling in lower extremities, anasarca, dizziness, palpitations Resp: No-   shortness of breath with exertion or at rest.              No-   productive cough,  No non-productive cough,  No- coughing up of blood.              No-   change in color of mucus.  No- wheezing.   Skin: No-   rash or lesions. GI:  No-   heartburn, indigestion, abdominal pain, nausea, vomiting,  GU:  MS:  No-   joint pain or swelling.   Neuro-     nothing unusual Psych:  No- change in mood or affect. No depression or anxiety.  No memory loss.  OBJ- Physical Exam General- Alert, Oriented, Affect-appropriate, Distress- none acute. Medium build. Skin- rash-none, lesions- none, excoriation- none. + tanned/ sunburned. Lymphadenopathy- none Head- atraumatic            Eyes- Gross vision intact, PERRLA, conjunctivae and secretions clear            Ears- Hearing, canals-normal  Nose- Clear, no-Septal dev, mucus, polyps, erosion, perforation             Throat- Mallampati II-III , mucosa clear , drainage- none, tonsils- atrophic Neck- flexible , trachea midline, no stridor , thyroid nl, carotid no bruit Chest - symmetrical excursion , unlabored           Heart/CV- RRR , no murmur , no gallop  , no rub, nl s1 s2                           - JVD- none , edema- none, stasis changes- none, varices- none           Lung- clear to P&A, wheeze- none, cough- none , dullness-none, rub- none           Chest wall-  Abd-  Br/ Gen/ Rectal- Not done, not indicated Extrem- cyanosis- none, clubbing, none, atrophy- none, strength- nl Neuro- grossly intact to  observation

## 2014-07-02 NOTE — Patient Instructions (Signed)
Order- New CPAP New DME auto 5-15, mask of choice, humidifier, supplies    Dx OSA  Please keep trying to stop smoking !

## 2014-07-14 ENCOUNTER — Other Ambulatory Visit: Payer: Self-pay | Admitting: Family Medicine

## 2014-08-15 ENCOUNTER — Telehealth: Payer: Self-pay | Admitting: Internal Medicine

## 2014-08-15 NOTE — Telephone Encounter (Signed)
OV note has been faxed. Jeani Hawking is aware. Nothing further was needed.

## 2014-08-30 ENCOUNTER — Telehealth: Payer: Self-pay | Admitting: Internal Medicine

## 2014-08-30 NOTE — Telephone Encounter (Signed)
lmtcb X1 for pt.  She only needs to be rescheduled from 6/23 to 6/30 if she can make that appt time.

## 2014-08-30 NOTE — Telephone Encounter (Signed)
Friday 09-13-14 at 11:15am can be used.

## 2014-08-30 NOTE — Telephone Encounter (Signed)
Katie, please advise on where to work pt in. Thanks

## 2014-08-31 NOTE — Telephone Encounter (Signed)
Sorry we do not have any openings that day. Pt was being worked in; [place] does not have an opening until August I believe.

## 2014-08-31 NOTE — Telephone Encounter (Signed)
Called and spoke with patient, she says that she cannot come on Fridays, she can only come on Thursdays.  Katie - please advise.

## 2014-08-31 NOTE — Telephone Encounter (Signed)
LMTCB x 1 

## 2014-09-03 NOTE — Telephone Encounter (Signed)
Scheduled for OV 11/12/14 at 10am with [facility].  Pt advised that if she has any issues with tolerance to let us know.  Nothing further needed.

## 2014-09-06 ENCOUNTER — Ambulatory Visit: Payer: 59 | Admitting: Internal Medicine

## 2014-09-30 ENCOUNTER — Encounter: Payer: Self-pay | Admitting: Internal Medicine

## 2014-09-30 NOTE — Assessment & Plan Note (Signed)
Appropriate to start CPAP trial with auto titration

## 2014-09-30 NOTE — Assessment & Plan Note (Signed)
She needs to make up her mind that she can and will stop smoking. Encouragement offered.

## 2014-11-08 ENCOUNTER — Other Ambulatory Visit: Payer: Self-pay | Admitting: Physical Medicine and Rehabilitation

## 2014-11-08 DIAGNOSIS — M5416 Radiculopathy, lumbar region: Secondary | ICD-10-CM

## 2014-11-12 ENCOUNTER — Ambulatory Visit (INDEPENDENT_AMBULATORY_CARE_PROVIDER_SITE_OTHER): Payer: 59 | Admitting: Internal Medicine

## 2014-11-12 ENCOUNTER — Telehealth: Payer: Self-pay | Admitting: Internal Medicine

## 2014-11-12 ENCOUNTER — Encounter: Payer: Self-pay | Admitting: Internal Medicine

## 2014-11-12 VITALS — BP 118/64 | HR 62 | Ht 64.0 in | Wt 173.4 lb

## 2014-11-12 DIAGNOSIS — Z720 Tobacco use: Secondary | ICD-10-CM

## 2014-11-12 DIAGNOSIS — G4733 Obstructive sleep apnea (adult) (pediatric): Secondary | ICD-10-CM

## 2014-11-12 DIAGNOSIS — F17200 Nicotine dependence, unspecified, uncomplicated: Secondary | ICD-10-CM

## 2014-11-12 NOTE — Patient Instructions (Addendum)
Order- DME [facility] download CPAP for pressure compliance,  Enroll AirView if available    Dx OSA  Please keep trying to stop smoking

## 2014-11-12 NOTE — Progress Notes (Signed)
08/13/11- 8 yoF current smoker followed for hypersomnia with snoring LOV- 08/16/09 Working 6 days per week and using Adderall on work days. Did not take any today-note BP. 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 Not using Ritalin which made her sleepier. Adderall 30 XR 1/ day. No sleep paralysis or cataplexy.  12/19/12- 29 yoF current smoker followed for hypersomnia with snoring FOLLOWS FOR: has not been sleeping well again. Grandson tells her she snores loudly after septoplasty. Bedtime around 9 or 10 PM and wakes frequently, gets up and walks around some before trying to go back to bed. Still takes clonazepam 0.5 mg around 5 or 6 PM. Adderall 30 mg XR works well taken once in the morning 6 days a week. Some days instead she will take Ritalin 10 mg in the morning. Some days she feels sleepy in the daytime and some nights she feels restless, but she hadn't paid attention to which medication she takes on those days. We discussed comparison. [facility] gabapentin for restless leg. She continues to smoke against advice. Has had flu shot  06/26/13- 42 yoF current smoker followed for hypersomnia with snoring FOLLOWS FOR: review home sleep study. Has continued use of stimulant meds as needed/ prescribed. Yolanda Bonine tells her she is snoring loudly. We wanted to re-visit question of OSA, hoping to find basis for more specific management than stimulants alone. Unattended home sleep study [facility] Clap) 95/63/87- confirms mild obstructive sleep apnea, AHI 11/ hr w/ desat to 53% on room air, weight 159 lbs. We reviewed medical concerns, sleep hygiene, and compared CPAP with oral appliance therapy. She would be a good candidate for oral appliance and chooses this approach.   07/02/14- 11 yoF current smoker followed for hypersomnia with snoring FOLLOWS FOR: Pt did not get oral appliance from Dr Oswaldo Done for the oral appliance did not work; pt is up for trying the CPAP  out. We can start with autotitration Still not trying to stop smoking  11/12/14-43 yoF current smoker followed for hypersomnia with snoring FOLLOWS FOR: Wears CPAP every night for at least 4 hours before taking off-wakes up with it off; DME is [facility]. Using CPAP makes her feel "100% better". She has a couple of different mask styles she wears. She is not trying to stop smoking despite counseling and offers of support.  ROS-see HPI Constitutional:   No-   weight loss, night sweats, fevers, chills, +fatigue, lassitude. HEENT:   No-  headaches, difficulty swallowing, tooth/dental problems, sore throat,       No-  sneezing, itching, ear ache, nasal congestion, post nasal drip,  CV:  No-   chest pain, orthopnea, PND, swelling in lower extremities, anasarca, dizziness, palpitations Resp: No-   shortness of breath with exertion or at rest.              No-   productive cough,  No non-productive cough,  No- coughing up of blood.              No-   change in color of mucus.  No- wheezing.   Skin: No-   rash or lesions. GI:  No-   heartburn, indigestion, abdominal pain, nausea, vomiting,  GU:  MS:  No-   joint pain or swelling.   Neuro-     nothing unusual Psych:  No- change in mood or affect. No depression or anxiety.  No memory loss.  OBJ- Physical Exam General- Alert, Oriented, Affect-appropriate, Distress- none  acute. Medium build. Skin- rash-none, lesions- none, excoriation- none. + tanned Lymphadenopathy- none Head- atraumatic            Eyes- Gross vision intact, PERRLA, conjunctivae and secretions clear            Ears- Hearing, canals-normal            Nose- Clear, no-Septal dev, mucus, polyps, erosion, perforation             Throat- Mallampati II-III , mucosa clear , drainage- none, tonsils- atrophic Neck- flexible , trachea midline, no stridor , thyroid nl, carotid no bruit Chest - symmetrical excursion , unlabored           Heart/CV- RRR , no murmur , no gallop  , no rub, nl s1 s2                            - JVD- none , edema- none, stasis changes- none, varices- none           Lung- + few crackles, wheeze- none, cough- none , dullness-none, rub- none           Chest wall-  Abd-  Br/ Gen/ Rectal- Not done, not indicated Extrem- cyanosis- none, clubbing, none, atrophy- none, strength- nl Neuro- grossly intact to observation

## 2014-11-12 NOTE — Telephone Encounter (Signed)
error 

## 2014-11-23 ENCOUNTER — Other Ambulatory Visit: Payer: Self-pay | Admitting: Family Medicine

## 2014-11-25 ENCOUNTER — Encounter: Payer: Self-pay | Admitting: Internal Medicine

## 2014-11-25 NOTE — Assessment & Plan Note (Signed)
She is making CPAP work by her description and is trying to use it at least 4 hours per night. We discussed comfort issues. Plan-download for pressure compliance documentation

## 2014-11-25 NOTE — Assessment & Plan Note (Signed)
I suspect some of her difficulty with smoking cessation is related to her diagnosis of OCD. We continue to try to motivate her, and encouraged her to participate in a smoking cessation program.

## 2015-01-29 ENCOUNTER — Telehealth: Payer: Self-pay | Admitting: Internal Medicine

## 2015-01-29 MED ORDER — VARENICLINE TARTRATE 0.5 MG X 11 & 1 MG X 42 PO MISC: ORAL | Status: DC

## 2015-01-29 NOTE — Telephone Encounter (Signed)
Called and spoke with pt and informed her that [facility] has approved for rx of chantix starter pack with instructions to take as directed.  Pharmacy verified with pt  Order sent electronically to pt's pharamcy  Nothing further is needed at this time.

## 2015-01-29 NOTE — Telephone Encounter (Signed)
Ok to Rx just the Chantix starter pack- take as directed

## 2015-01-29 NOTE — Telephone Encounter (Signed)
Called and spoke with pt Pt stated having some adverse reactions to Chantix in the past that caused her not to continue to take it.  Pt stated suffering from vivid nightmares and some increased aggression when taking it Pt states that all of this was years ago and thinks if she starts on the starter pack at 0.5mg  that she would do better Pt is requesting a rx for chantix starter pack to be sent to her pharmacy Pt stated that if she suffers from adverse reactions again she will stop taking the medicine and contact office Informed pt that i would need this approved by [facility] before sending in rx  Dr Annamaria Boots, are you ok with ordering this medication for the pt? Please advise. Thanks  Allergies  Allergen Reactions  . Ciprofloxacin     REACTION: reaction not known  . Levofloxacin     REACTION: reaction not known  . Varenicline Tartrate     REACTION: headache   Current Outpatient Prescriptions on File Prior to Visit  Medication Sig Dispense Refill  . clonazePAM (KLONOPIN) 0.5 MG tablet Take 1 tablet by mouth at bedtime.    . hydrochlorothiazide (HYDRODIURIL) 25 MG tablet TAKE 1 TABLET (25 MG TOTAL) BY MOUTH DAILY. 90 tablet 3  . HYDROcodone-acetaminophen (NORCO) 10-325 MG per tablet Take 1 tablet by mouth every 4 (four) hours as needed.      . metoprolol succinate (TOPROL-XL) 25 MG 24 hr tablet TAKE 1 TABLET BY MOUTH EVERY DAY 90 tablet 1   No current facility-administered medications on file prior to visit.

## 2015-05-13 ENCOUNTER — Ambulatory Visit: Payer: 59 | Admitting: Internal Medicine

## 2015-06-13 ENCOUNTER — Other Ambulatory Visit: Payer: Self-pay | Admitting: Family Medicine

## 2015-06-13 NOTE — Telephone Encounter (Signed)
No recent/future appt., please advise  

## 2015-06-13 NOTE — Telephone Encounter (Signed)
Please schedule a spring f/u and refill until then 

## 2015-06-14 NOTE — Telephone Encounter (Signed)
appt scheduled and med refilled 

## 2015-06-14 NOTE — Telephone Encounter (Signed)
Left voicemail requesting pt to call the office back 

## 2015-07-22 ENCOUNTER — Encounter: Payer: Self-pay | Admitting: Family Medicine

## 2015-07-22 ENCOUNTER — Ambulatory Visit (INDEPENDENT_AMBULATORY_CARE_PROVIDER_SITE_OTHER): Payer: Managed Care, Other (non HMO) | Admitting: Family Medicine

## 2015-07-22 VITALS — BP 130/80 | HR 77 | Temp 98.4°F | Ht 64.0 in | Wt 170.5 lb

## 2015-07-22 DIAGNOSIS — R7309 Other abnormal glucose: Secondary | ICD-10-CM | POA: Diagnosis not present

## 2015-07-22 DIAGNOSIS — E785 Hyperlipidemia, unspecified: Secondary | ICD-10-CM | POA: Diagnosis not present

## 2015-07-22 DIAGNOSIS — E538 Deficiency of other specified B group vitamins: Secondary | ICD-10-CM | POA: Diagnosis not present

## 2015-07-22 DIAGNOSIS — R7989 Other specified abnormal findings of blood chemistry: Secondary | ICD-10-CM

## 2015-07-22 DIAGNOSIS — I10 Essential (primary) hypertension: Secondary | ICD-10-CM

## 2015-07-22 DIAGNOSIS — F17200 Nicotine dependence, unspecified, uncomplicated: Secondary | ICD-10-CM | POA: Diagnosis not present

## 2015-07-22 DIAGNOSIS — Z1231 Encounter for screening mammogram for malignant neoplasm of breast: Secondary | ICD-10-CM

## 2015-07-22 MED ORDER — METOPROLOL SUCCINATE ER 25 MG PO TB24: 25.0000 mg | ORAL_TABLET | Freq: Every day | ORAL | Status: DC

## 2015-07-22 MED ORDER — HYDROCHLOROTHIAZIDE 25 MG PO TABS: ORAL_TABLET | ORAL | Status: DC

## 2015-07-22 NOTE — Assessment & Plan Note (Signed)
A1C today Disc imp of low glycemic diet/exercise as tol and wt loss to prevent DM2

## 2015-07-22 NOTE — Assessment & Plan Note (Signed)
Lipid panel today  Rev low sat fat diet and goals for lipid profile

## 2015-07-22 NOTE — Assessment & Plan Note (Signed)
Ref for annual mammogram  Enc monthly self exams and smoking cessation

## 2015-07-22 NOTE — Progress Notes (Signed)
Subjective:    Patient ID: Desiree Walters, female    DOB: 08-Nov-1970, 45 y.o.   MRN: [id number]  HPI Here for f/u of chronic health problems   Working a lot   Having knee surgery next week  Had arthroscopy in the past - missing cartilage - this is a similar surgery  If this does not work - they may have to replace   Wt is down 3 lb with bmi of 29  Is drinking a lot of water - cut back on soda  bp is stable today  No cp or palpitations or headaches or edema  No side effects to medicines  BP Readings from Last 3 Encounters:  07/22/15 132/86  11/12/14 118/64  07/02/14 122/74     Smoking -no changes Took chantix for a month - did not work well  She may try to quit while she is home recovering - is motivated  Thinks she has a smokers cough/ mild  Also allergies - pollen   Hx of hyperglycemia Eating better- less soda  Sweets once in a blue moon  Some carbs   Limited to exercise due to knee  Thinks she has an exercise bike at her apt complex (will have PT for knee soon)   More chicken/less fatty meat  occ french fries   Patient Active Problem List   Diagnosis Date Noted  . Menopausal syndrome (hot flashes) 05/09/2014  . Encounter for screening mammogram for breast cancer 05/09/2014  . Other screening mammogram 04/10/2013  . Vitamin B12 deficiency 10/09/2009  . Hyperlipidemia 10/09/2009  . HYPERGLYCEMIA 10/09/2009  . RHINITIS 05/10/2008  . HYPERSOMNIA 09/26/2007  . MENORRHAGIA 11/10/2006  . HEADACHE 11/10/2006  . Obstructive sleep apnea 08/20/2006  . TOBACCO ABUSE 08/04/2006  . Essential hypertension 08/04/2006  . OBSESSIVE-COMPULSIVE DISORDER 07/26/2006  . FIBROMYALGIA 07/26/2006   Past Medical History  Diagnosis Date  . Tobacco abuse   . OCD (obsessive compulsive disorder)   . Hyperlipemia   . Hypersomnia     NPSG 08-25-07-1.2/hr  . Pernicious anemia   . Diabetes mellitus   . Obesity   . Fibromyalgia    Past Surgical History  Procedure Laterality  Date  . Foot surgery  2005; 2008  . Nasal septum surgery    . Abdominal hysterectomy    . Iud - merina  2008   Social History  Substance Use Topics  . Smoking status: Current Every Day Smoker -- 1.00 packs/day for 20 years    Types: Cigarettes  . Smokeless tobacco: None  . Alcohol Use: 0.0 oz/week    0 Standard drinks or equivalent per week     Comment: rare   Family History  Problem Relation Age of Onset  . Hypertension Father   . Coronary artery disease Father   . Sleep apnea Father     with CPAP  . Heart disease Father     CAD  . Hyperlipidemia Father   . Hypertension Mother   . Hyperlipidemia Mother   . Hypertension Sister   . Hyperlipidemia Other     aunts/uncles  . Coronary artery disease Other     aunts/uncles   Allergies  Allergen Reactions  . Ciprofloxacin     REACTION: reaction not known  . Levofloxacin     REACTION: reaction not known  . Varenicline Tartrate     REACTION: headache   Current Outpatient Prescriptions on File Prior to Visit  Medication Sig Dispense Refill  . clonazePAM (KLONOPIN) 0.5 MG  tablet Take 1 tablet by mouth at bedtime.    Marland Kitchen HYDROcodone-acetaminophen (NORCO) 10-325 MG per tablet Take 1 tablet by mouth every 4 (four) hours as needed.       No current facility-administered medications on file prior to visit.    Review of Systems Review of Systems  Constitutional: Negative for fever, appetite change, fatigue and unexpected weight change.  Eyes: Negative for pain and visual disturbance.  Respiratory: Negative for cough and shortness of breath.   Cardiovascular: Negative for cp or palpitations    Gastrointestinal: Negative for nausea, diarrhea and constipation.  Genitourinary: Negative for urgency and frequency.  Skin: Negative for pallor or rash   MSK pos for chronic knee pain Neurological: Negative for weakness, light-headedness, numbness and headaches.  Hematological: Negative for adenopathy. Does not bruise/bleed easily.    Psychiatric/Behavioral: Negative for dysphoric mood. The patient is not nervous/anxious.         Objective:   Physical Exam  Constitutional: She appears well-developed and well-nourished. No distress.  overwt and well appearing   HENT:  Head: Normocephalic and atraumatic.  Mouth/Throat: Oropharynx is clear and moist.  Eyes: Conjunctivae and EOM are normal. Pupils are equal, round, and reactive to light. No scleral icterus.  Neck: Normal range of motion. Neck supple. No JVD present. Carotid bruit is not present. No thyromegaly present.  Cardiovascular: Normal rate, regular rhythm, normal heart sounds and intact distal pulses.  Exam reveals no gallop.   Pulmonary/Chest: Effort normal and breath sounds normal. No respiratory distress. She has no wheezes. She has no rales.  No crackles No wheeze   Mildly distant bs with good air exch  Abdominal: Soft. Bowel sounds are normal. She exhibits no distension, no abdominal bruit and no mass. There is no tenderness.  Musculoskeletal: She exhibits no edema.  Lymphadenopathy:    She has no cervical adenopathy.  Neurological: She is alert. She has normal reflexes. No cranial nerve deficit. Coordination normal.  Skin: Skin is warm and dry. No rash noted.  Psychiatric: She has a normal mood and affect.          Assessment & Plan:   Problem List Items Addressed This Visit      Cardiovascular and Mediastinum   Essential hypertension - Primary    bp in fair control at this time  BP Readings from Last 1 Encounters:  07/22/15 130/80   No changes needed Disc lifstyle change with low sodium diet and exercise  Labs ordered  Enc wt loss and smoking cessation      Relevant Medications   metoprolol succinate (TOPROL-XL) 25 MG 24 hr tablet   hydrochlorothiazide (HYDRODIURIL) 25 MG tablet   Other Relevant Orders   CBC with Differential/Platelet   Comprehensive metabolic panel   TSH   Lipid panel     Digestive   Vitamin B12 deficiency     B12 level today Some fatigue Shots in the past  Not on oral repl  tx if needed       Relevant Orders   Vitamin B12     Other   Encounter for screening mammogram for breast cancer    Ref for annual mammogram  Enc monthly self exams and smoking cessation       Relevant Orders   MM DIGITAL SCREENING BILATERAL   HYPERGLYCEMIA    A1C today Disc imp of low glycemic diet/exercise as tol and wt loss to prevent DM2      Relevant Orders   Hemoglobin A1c  Hyperlipidemia    Lipid panel today  Rev low sat fat diet and goals for lipid profile        Relevant Medications   metoprolol succinate (TOPROL-XL) 25 MG 24 hr tablet   hydrochlorothiazide (HYDRODIURIL) 25 MG tablet   Other Relevant Orders   Lipid panel   TOBACCO ABUSE    Disc in detail risks of smoking and possible outcomes including copd, vascular/ heart disease, cancer , respiratory and sinus infections  Pt voices understanding Pt not currently ready to quit  May try cold [place] when she is home recovering from knee surgery (less stress)

## 2015-07-22 NOTE — Patient Instructions (Addendum)
Stop at check out for referral for annual mammogram  I refilled your blood pressure medicine Make it a goal to cut back and quit smoking while you are home recovering from knee surgery (get all the cigarettes out of the house)  Labs today  Take care of yourself

## 2015-07-22 NOTE — Assessment & Plan Note (Signed)
B12 level today Some fatigue Shots in the past  Not on oral repl  tx if needed

## 2015-07-22 NOTE — Progress Notes (Signed)
Pre visit review using our clinic review tool, if applicable. No additional management support is needed unless otherwise documented below in the visit note. 

## 2015-07-22 NOTE — Assessment & Plan Note (Addendum)
bp in fair control at this time  BP Readings from Last 1 Encounters:  07/22/15 130/80   No changes needed Disc lifstyle change with low sodium diet and exercise  Labs ordered  Enc wt loss and smoking cessation

## 2015-07-22 NOTE — Assessment & Plan Note (Signed)
Disc in detail risks of smoking and possible outcomes including copd, vascular/ heart disease, cancer , respiratory and sinus infections  Pt voices understanding Pt not currently ready to quit  May try cold [place] when she is home recovering from knee surgery (less stress)

## 2015-07-23 LAB — COMPREHENSIVE METABOLIC PANEL
ALT: 15 U/L (ref 0–35)
AST: 19 U/L (ref 0–37)
Albumin: 4.4 g/dL (ref 3.5–5.2)
Alkaline Phosphatase: 50 U/L (ref 39–117)
BUN: 9 mg/dL (ref 6–23)
CO2: 24 mEq/L (ref 19–32)
Calcium: 9.3 mg/dL (ref 8.4–10.5)
Chloride: 106 mEq/L (ref 96–112)
Creatinine, Ser: 0.59 mg/dL (ref 0.40–1.20)
GFR: 117.44 mL/min (ref >60.00)
Glucose, Bld: 94 mg/dL (ref 70–99)
Potassium: 3.9 mEq/L (ref 3.5–5.1)
Sodium: 139 mEq/L (ref 135–145)
Total Bilirubin: 0.3 mg/dL (ref 0.2–1.2)
Total Protein: 6.8 g/dL (ref 6.0–8.3)

## 2015-07-23 LAB — LIPID PANEL
Cholesterol: 215 mg/dL — ABNORMAL HIGH (ref 0–200)
HDL: 46.2 mg/dL (ref >39.00)
NonHDL: 169.08
Total CHOL/HDL Ratio: 5
Triglycerides: 207 mg/dL — ABNORMAL HIGH (ref 0.0–149.0)
VLDL: 41.4 mg/dL — ABNORMAL HIGH (ref 0.0–40.0)

## 2015-07-23 LAB — CBC WITH DIFFERENTIAL/PLATELET
Basophils Absolute: 0 10*3/uL (ref 0.0–0.1)
Basophils Relative: 0.4 % (ref 0.0–3.0)
Eosinophils Absolute: 0.2 10*3/uL (ref 0.0–0.7)
Eosinophils Relative: 2.5 % (ref 0.0–5.0)
HCT: 44 % (ref 36.0–46.0)
Hemoglobin: 14.7 g/dL (ref 12.0–15.0)
Lymphocytes Relative: 26 % (ref 12.0–46.0)
Lymphs Abs: 1.9 10*3/uL (ref 0.7–4.0)
MCHC: 33.5 g/dL (ref 30.0–36.0)
MCV: 85.8 fl (ref 78.0–100.0)
Monocytes Absolute: 0.5 10*3/uL (ref 0.1–1.0)
Monocytes Relative: 6.4 % (ref 3.0–12.0)
Neutro Abs: 4.7 10*3/uL (ref 1.4–7.7)
Neutrophils Relative %: 64.7 % (ref 43.0–77.0)
Platelets: 227 10*3/uL (ref 150.0–400.0)
RBC: 5.13 Mil/uL — ABNORMAL HIGH (ref 3.87–5.11)
RDW: 14.5 % (ref 11.5–15.5)
WBC: 7.2 10*3/uL (ref 4.0–10.5)

## 2015-07-23 LAB — HEMOGLOBIN A1C: Hgb A1c MFr Bld: 5.9 % (ref 4.6–6.5)

## 2015-07-23 LAB — LDL CHOLESTEROL, DIRECT: Direct LDL: 149 mg/dL

## 2015-07-23 LAB — VITAMIN B12: Vitamin B-12: 190 pg/mL — ABNORMAL LOW (ref 211–911)

## 2015-07-23 LAB — TSH: TSH: 0.53 u[IU]/mL (ref 0.35–4.50)

## 2015-07-30 ENCOUNTER — Encounter: Payer: Self-pay | Admitting: Family Medicine

## 2015-07-30 ENCOUNTER — Telehealth: Payer: Self-pay | Admitting: Family Medicine

## 2015-07-30 NOTE — Telephone Encounter (Signed)
Patient returned Lugene's call. °

## 2015-08-03 ENCOUNTER — Telehealth: Payer: Self-pay | Admitting: Family Medicine

## 2015-08-03 MED ORDER — CYANOCOBALAMIN 1000 MCG/ML IJ SOLN: INTRAMUSCULAR | Status: DC

## 2015-08-03 MED ORDER — ATORVASTATIN CALCIUM 10 MG PO TABS: 10.0000 mg | ORAL_TABLET | Freq: Every day | ORAL | Status: DC

## 2015-08-03 NOTE — Telephone Encounter (Signed)
Px for B12 inj and also atorvastatin

## 2015-08-08 NOTE — Telephone Encounter (Signed)
[facility] left v/m wanting to verify that pt should be taking B 12 shots weekly for 4 weeks instead of monthly; [facility] said [facility] not [facility] of taking B12 inj weekly. Per lab result note appears should take one inj weekly for 4 weeks and then retest B 12 on 5th week. Want to verify before contacting pharmacy or pt.

## 2015-08-08 NOTE — Telephone Encounter (Signed)
Sorry about that -I meant to make instructions one shot weekly for 4 weeks Please change the px thanks

## 2015-08-09 NOTE — Telephone Encounter (Signed)
Rx is correct they just wanted to make sure it should be once weekly not once monthly, called and left voicemail letting pharmacy know that Rx is correct and pt needs to get one shot once a week for 4 weeks

## 2015-08-19 ENCOUNTER — Ambulatory Visit (INDEPENDENT_AMBULATORY_CARE_PROVIDER_SITE_OTHER)
Admission: RE | Admit: 2015-08-19 | Discharge: 2015-08-19 | Disposition: A | Discharge status: Home / Self Care | Payer: Managed Care, Other (non HMO) | Source: Ambulatory Visit | Attending: Internal Medicine | Admitting: Internal Medicine

## 2015-08-19 ENCOUNTER — Ambulatory Visit (INDEPENDENT_AMBULATORY_CARE_PROVIDER_SITE_OTHER): Payer: Managed Care, Other (non HMO) | Admitting: Internal Medicine

## 2015-08-19 ENCOUNTER — Encounter: Payer: Self-pay | Admitting: Internal Medicine

## 2015-08-19 VITALS — BP 120/76 | HR 73 | Ht 64.0 in | Wt 169.0 lb

## 2015-08-19 DIAGNOSIS — J42 Unspecified chronic bronchitis: Secondary | ICD-10-CM

## 2015-08-19 DIAGNOSIS — G4733 Obstructive sleep apnea (adult) (pediatric): Secondary | ICD-10-CM

## 2015-08-19 DIAGNOSIS — J209 Acute bronchitis, unspecified: Secondary | ICD-10-CM

## 2015-08-19 DIAGNOSIS — F17200 Nicotine dependence, unspecified, uncomplicated: Secondary | ICD-10-CM

## 2015-08-19 MED ORDER — FLUTICASONE FUROATE-VILANTEROL 100-25 MCG/INH IN AEPB: 1.0000 | INHALATION_SPRAY | Freq: Every day | RESPIRATORY_TRACT | Status: DC

## 2015-08-19 MED ORDER — FLUTICASONE FUROATE-VILANTEROL 100-25 MCG/INH IN AEPB: INHALATION_SPRAY | RESPIRATORY_TRACT | Status: DC

## 2015-08-19 MED ORDER — BUPROPION HCL ER (SR) 100 MG PO TB12: 100.0000 mg | ORAL_TABLET | Freq: Two times a day (BID) | ORAL | Status: DC

## 2015-08-19 MED ORDER — ALBUTEROL SULFATE HFA 108 (90 BASE) MCG/ACT IN AERS: 2.0000 | INHALATION_SPRAY | Freq: Four times a day (QID) | RESPIRATORY_TRACT | Status: DC | PRN

## 2015-08-19 NOTE — Progress Notes (Signed)
06/26/13- 45 yoF current smoker followed for hypersomnia with snoring FOLLOWS FOR: review home sleep study. Has continued use of stimulant meds as needed/ prescribed. Yolanda Bonine tells her she is snoring loudly. We wanted to re-visit question of OSA, hoping to find basis for more specific management than stimulants alone. Unattended home sleep study Danton Clap) [id number]- confirms mild obstructive sleep apnea, AHI 11/ hr w/ desat to 53% on room air, weight 159 lbs. We reviewed medical concerns, sleep hygiene, and compared CPAP with oral appliance therapy. She would be a good candidate for oral appliance and chooses this approach.   07/02/14- 45 yoF current smoker followed for hypersomnia with snoring FOLLOWS FOR: Pt did not get oral appliance from Dr Oswaldo Done for the oral appliance did not work; pt is up for trying the CPAP out. We can start with autotitration Still not trying to stop smoking  11/12/14-45 yoF current smoker followed for OSA FOLLOWS FOR: Wears CPAP every night for at least 4 hours before taking off-wakes up with it off; DME is [facility]. Using CPAP makes her feel "100% better". She has a couple of different mask styles she wears. She is not trying to stop smoking despite counseling and offers of support.  08/19/2015-45 year old female smoker followed for OSA complicated by tobacco use CPAP auto/5-15   [facility]                           Had failed oral appliance FOLLOWS FOR:DME [facility]; pt wears CPAP nightly. DL attached. She is comfortable using CPAP routinely but download shows that she misses some nights and takes mask off too early, not quite meeting compliance goals. Control is excellent. We reviewed her download documentation. She continues to smoke and is willing to try again at smoking cessation. We discussed Wellbutrin. She has noticed more wheeze and shortness of breath in the last several months with no acute event. Office Spirometry 08/19/2015-within normal limits-FVC 3.51/102%, FEV1  2.91/102%, FEV1/FVC 0.83, FEF 25-75 percent 2.77/88%  ROS-see HPI Constitutional:   No-   weight loss, night sweats, fevers, chills, +fatigue, lassitude. HEENT:   No-  headaches, difficulty swallowing, tooth/dental problems, sore throat,       No-  sneezing, itching, ear ache, nasal congestion, post nasal drip,  CV:  No-   chest pain, orthopnea, PND, swelling in lower extremities, anasarca, dizziness, palpitations Resp: No-   shortness of breath with exertion or at rest.              No-   productive cough,  No non-productive cough,  No- coughing up of blood.              No-   change in color of mucus.  No- wheezing.   Skin: No-   rash or lesions. GI:  No-   heartburn, indigestion, abdominal pain, nausea, vomiting,  GU:  MS:  No-   joint pain or swelling.   Neuro-     nothing unusual Psych:  No- change in mood or affect. No depression or anxiety.  No memory loss.  OBJ- Physical Exam General- Alert, Oriented, Affect-appropriate, Distress- none acute. Medium build. Skin- rash-none, lesions- none, excoriation- none. + Over tanned Lymphadenopathy- none Head- atraumatic            Eyes- Gross vision intact, PERRLA, conjunctivae and secretions clear            Ears- Hearing, canals-normal  Nose- Clear, no-Septal dev, mucus, polyps, erosion, perforation             Throat- Mallampati II-III , mucosa clear , drainage- none, tonsils- atrophic Neck- flexible , trachea midline, no stridor , thyroid nl, carotid no bruit Chest - symmetrical excursion , unlabored           Heart/CV- RRR , no murmur , no gallop  , no rub, nl s1 s2                           - JVD- none , edema- none, stasis changes- none, varices- none           Lung- + few crackles, wheeze +, cough- none , dullness-none, rub- none           Chest wall-  Abd-  Br/ Gen/ Rectal- Not done, not indicated Extrem- cyanosis- none, clubbing, none, atrophy- none, strength- nl Neuro- grossly intact to observation

## 2015-08-19 NOTE — Progress Notes (Signed)
Patient ID: Desiree Walters, female   DOB: 1970/06/09, 45 y.o.   MRN: [id number]   Patient seen in the office today and instructed on use of Breo.  Patient expressed understanding and demonstrated technique.

## 2015-08-19 NOTE — Patient Instructions (Addendum)
Order- CXR   Dx bronchitis  Order- Office Spirometry  Please keep trying to stop smoking-           Script sent for bupropion/ Welbutrin to help with smoking cessation  Sample and printed script for Breo Ellipta 100 maintenance inhaler   Inhale 1 puff then rinse mouth, once daily  Script sent for albuterol rescue inhaler    2 puffs every 6 hours if needed for wheezing and short of breath  Please try to use your CPAP more if you can

## 2015-08-19 NOTE — Assessment & Plan Note (Signed)
She is willing to try bupropion after discussion. Prescription given.

## 2015-08-19 NOTE — Assessment & Plan Note (Signed)
I am surprised PFT was normal with her mild but definite wheezing. Plan-emphasis on smoking cessation. Chest x-ray. Maintenance and rescue inhalers with discussion.

## 2015-08-19 NOTE — Assessment & Plan Note (Signed)
Good control but download shows she needs to use machine a bit more to meet compliance goals as discussed with her. She will try.

## 2015-08-23 ENCOUNTER — Encounter: Payer: Self-pay | Admitting: Internal Medicine

## 2015-08-30 NOTE — Progress Notes (Signed)
Quick Note:  LMTCB ______ 

## 2015-09-10 ENCOUNTER — Encounter: Payer: Self-pay | Admitting: Family Medicine

## 2015-09-19 ENCOUNTER — Other Ambulatory Visit: Payer: Self-pay | Admitting: Family Medicine

## 2015-09-19 ENCOUNTER — Other Ambulatory Visit: Payer: Self-pay | Admitting: Internal Medicine

## 2015-09-19 MED ORDER — FLUTICASONE FUROATE-VILANTEROL 100-25 MCG/INH IN AEPB: 1.0000 | INHALATION_SPRAY | Freq: Every day | RESPIRATORY_TRACT | Status: DC

## 2015-09-19 MED ORDER — CYANOCOBALAMIN 1000 MCG/ML IJ SOLN: INTRAMUSCULAR | Status: DC

## 2015-09-19 NOTE — Telephone Encounter (Signed)
Rx was sent on 08/03/15 #30ml with 0 refill, did you want to refill med again with same directions (once weekly)

## 2015-09-19 NOTE — Addendum Note (Signed)
Addended by: Tammi Sou on: 09/19/2015 10:27 AM   Modules accepted: Orders

## 2015-09-19 NOTE — Telephone Encounter (Signed)
done

## 2015-09-19 NOTE — Telephone Encounter (Signed)
Please refill times one with directions to inject once monthly instead of weekly I would like her to get a B12 level sometime this month thanks

## 2015-09-19 NOTE — Addendum Note (Signed)
Addended by: Desmond Dike C on: 09/19/2015 09:58 AM   Modules accepted: Orders

## 2015-09-21 ENCOUNTER — Other Ambulatory Visit: Payer: Self-pay | Admitting: Physical Medicine and Rehabilitation

## 2015-09-21 DIAGNOSIS — M5416 Radiculopathy, lumbar region: Secondary | ICD-10-CM

## 2015-09-26 ENCOUNTER — Other Ambulatory Visit: Payer: Self-pay | Admitting: *Deleted

## 2015-09-26 MED ORDER — ATORVASTATIN CALCIUM 10 MG PO TABS: 10.0000 mg | ORAL_TABLET | Freq: Every day | ORAL | Status: DC

## 2015-09-26 NOTE — Telephone Encounter (Signed)
Received fax requesting Rx to be changed to a 90 day supply, done 

## 2015-09-30 ENCOUNTER — Encounter: Payer: Self-pay | Admitting: Family Medicine

## 2015-10-01 ENCOUNTER — Telehealth: Payer: Self-pay | Admitting: Family Medicine

## 2015-10-01 MED ORDER — NITROFURANTOIN MONOHYD MACRO 100 MG PO CAPS: 100.0000 mg | ORAL_CAPSULE | Freq: Two times a day (BID) | ORAL | Status: DC

## 2015-10-01 NOTE — Telephone Encounter (Signed)
tx uti

## 2015-10-07 ENCOUNTER — Ambulatory Visit
Admission: RE | Admit: 2015-10-07 | Discharge: 2015-10-07 | Disposition: A | Discharge status: Home / Self Care | Payer: Managed Care, Other (non HMO) | Source: Ambulatory Visit | Attending: Physical Medicine and Rehabilitation | Admitting: Physical Medicine and Rehabilitation

## 2015-10-07 DIAGNOSIS — M5416 Radiculopathy, lumbar region: Secondary | ICD-10-CM

## 2015-10-24 ENCOUNTER — Telehealth: Payer: Self-pay | Admitting: Internal Medicine

## 2015-10-24 DIAGNOSIS — J449 Chronic obstructive pulmonary disease, unspecified: Secondary | ICD-10-CM

## 2015-10-24 NOTE — Telephone Encounter (Signed)
Pt is aware of request and states she is okay with coming by next week to have drawn. Lab order placed and request that was mailed has been sent to scan in [facility].

## 2015-10-25 ENCOUNTER — Other Ambulatory Visit: Payer: Self-pay | Admitting: Internal Medicine

## 2015-10-25 MED ORDER — FLUTICASONE FUROATE-VILANTEROL 100-25 MCG/INH IN AEPB: 1.0000 | INHALATION_SPRAY | Freq: Every day | RESPIRATORY_TRACT | 3 refills | Status: DC

## 2015-10-27 ENCOUNTER — Encounter: Payer: Self-pay | Admitting: Family Medicine

## 2015-10-27 DIAGNOSIS — E538 Deficiency of other specified B group vitamins: Secondary | ICD-10-CM

## 2015-10-27 DIAGNOSIS — E785 Hyperlipidemia, unspecified: Secondary | ICD-10-CM

## 2015-10-31 ENCOUNTER — Telehealth: Payer: Self-pay | Admitting: Internal Medicine

## 2015-10-31 MED ORDER — FLUCONAZOLE 150 MG PO TABS: 150.0000 mg | ORAL_TABLET | Freq: Every day | ORAL | 0 refills | Status: DC

## 2015-10-31 NOTE — Telephone Encounter (Signed)
Offer diflucan 150 mg, # 7, 1 daily.  Suggest using Breo before breakfast, then brush teeth as usual after breakfast. See if this pattern prevents repeated thrush.

## 2015-10-31 NOTE — Telephone Encounter (Signed)
Called and spoke with pt and she stated that she has had a headache x 1 week and now has started with a very painful tongue that has a white film on it.  Pt feels that this is thrush.  Pt is requesting that something be called in for this.  CY please advise. Thanks  Last ov--08/19/15 Next ov--no pending appts  Allergies  Allergen Reactions  . Ciprofloxacin     REACTION: reaction not known  . Levofloxacin     REACTION: reaction not known  . Quinolones     Other reaction(s): Unknown  . Varenicline Tartrate     REACTION: headache

## 2015-10-31 NOTE — Telephone Encounter (Signed)
Called patient and is aware of recs. RX sent in. Nothing further needed

## 2015-11-06 ENCOUNTER — Other Ambulatory Visit (INDEPENDENT_AMBULATORY_CARE_PROVIDER_SITE_OTHER): Payer: Managed Care, Other (non HMO)

## 2015-11-06 ENCOUNTER — Other Ambulatory Visit: Payer: Managed Care, Other (non HMO)

## 2015-11-06 ENCOUNTER — Encounter: Payer: Self-pay | Admitting: Family Medicine

## 2015-11-06 DIAGNOSIS — E538 Deficiency of other specified B group vitamins: Secondary | ICD-10-CM | POA: Diagnosis not present

## 2015-11-06 DIAGNOSIS — E785 Hyperlipidemia, unspecified: Secondary | ICD-10-CM

## 2015-11-06 DIAGNOSIS — J449 Chronic obstructive pulmonary disease, unspecified: Secondary | ICD-10-CM

## 2015-11-06 LAB — LIPID PANEL
Cholesterol: 198 mg/dL (ref 0–200)
HDL: 44.7 mg/dL (ref >39.00)
LDL Cholesterol: 119 mg/dL — ABNORMAL HIGH (ref 0–99)
NonHDL: 153.62
Total CHOL/HDL Ratio: 4
Triglycerides: 174 mg/dL — ABNORMAL HIGH (ref 0.0–149.0)
VLDL: 34.8 mg/dL (ref 0.0–40.0)

## 2015-11-06 LAB — VITAMIN B12: Vitamin B-12: 375 pg/mL (ref 211–911)

## 2015-11-06 MED ORDER — CYANOCOBALAMIN 1000 MCG/ML IJ SOLN: INTRAMUSCULAR | 5 refills | Status: DC

## 2015-11-14 LAB — ALPHA-1 ANTITRYPSIN PHENOTYPE: A-1 Antitrypsin: 141 mg/dL (ref 83–199)

## 2015-11-25 ENCOUNTER — Other Ambulatory Visit: Payer: Self-pay | Admitting: Internal Medicine

## 2015-11-25 ENCOUNTER — Telehealth: Payer: Self-pay | Admitting: Internal Medicine

## 2015-11-25 MED ORDER — BUDESONIDE-FORMOTEROL FUMARATE 80-4.5 MCG/ACT IN AERO: 2.0000 | INHALATION_SPRAY | Freq: Two times a day (BID) | RESPIRATORY_TRACT | 5 refills | Status: DC

## 2015-11-25 NOTE — Progress Notes (Signed)
lmtcb x1 

## 2015-11-25 NOTE — Telephone Encounter (Signed)
Called spoke with pt. Reviewed CY's recs. She states she just wanted to try the Symbicort first and she would call us back if it was not working well. She voiced understanding and had no further questions. [facility] [place]. Nothing further needed.

## 2015-11-25 NOTE — Telephone Encounter (Signed)
Spoke with pt and she has thrush again. She would like to know if there is another inhaler she can try that would be less likely to cause thrush. Pt states that she has been brushing her teeth and tongue after every use. Pt also states that she got new toothbrush after last episode of thrush. Pt currently on Breo.  CY - Please advise. Thanks!    Allergies  Allergen Reactions  . Ciprofloxacin     REACTION: reaction not known  . Levofloxacin     REACTION: reaction not known  . Quinolones     Other reaction(s): Unknown  . Varenicline Tartrate     REACTION: headache    Current Outpatient Prescriptions on File Prior to Visit  Medication Sig Dispense Refill  . albuterol (PROVENTIL HFA;VENTOLIN HFA) 108 (90 Base) MCG/ACT inhaler Inhale 2 puffs into the lungs every 6 (six) hours as needed for wheezing or shortness of breath. 1 Inhaler 12  . atorvastatin (LIPITOR) 10 MG tablet Take 1 tablet (10 mg total) by mouth daily. In the evening 90 tablet 2  . buPROPion (WELLBUTRIN SR) 100 MG 12 hr tablet Take 1 tablet (100 mg total) by mouth 2 (two) times daily. 60 tablet 6  . clonazePAM (KLONOPIN) 0.5 MG tablet Take 1 tablet by mouth at bedtime.    . cyanocobalamin (,VITAMIN B-12,) 1000 MCG/ML injection Inject 1000 mcg IM once a month 1 mL 5  . cyclobenzaprine (FLEXERIL) 10 MG tablet Take 1 tablet by mouth 3 (three) times daily as needed.    . fluconazole (DIFLUCAN) 150 MG tablet Take 1 tablet (150 mg total) by mouth daily. 7 tablet 0  . fluticasone furoate-vilanterol (BREO ELLIPTA) 100-25 MCG/INH AEPB Inhale 1 puff into the lungs daily. 60 each 3  . hydrochlorothiazide (HYDRODIURIL) 25 MG tablet TAKE 1 TABLET (25 MG TOTAL) BY MOUTH DAILY. 90 tablet 3  . HYDROcodone-acetaminophen (NORCO) 10-325 MG per tablet Take 1 tablet by mouth every 4 (four) hours as needed.      . metoprolol succinate (TOPROL-XL) 25 MG 24 hr tablet Take 1 tablet (25 mg total) by mouth daily. 90 tablet 3  . nitrofurantoin,  macrocrystal-monohydrate, (MACROBID) 100 MG capsule Take 1 capsule (100 mg total) by mouth 2 (two) times daily. 14 capsule 0   No current facility-administered medications on file prior to visit.

## 2015-11-25 NOTE — Telephone Encounter (Signed)
If her insurance will cover either Symbicort 80   Inhale 2 puffs then rinse mouth twice daily  Ref x 5                                                       Advair HFA 115/21   Same directions                                                 Or  Dulera 100                Same directions  The we can Rx an aerochamber spacer  To use with any of these devices. This should help minimize thrush.

## 2016-02-05 ENCOUNTER — Other Ambulatory Visit: Payer: Self-pay

## 2016-02-05 MED ORDER — BUPROPION HCL ER (SR) 100 MG PO TB12: 100.0000 mg | ORAL_TABLET | Freq: Two times a day (BID) | ORAL | 3 refills | Status: DC

## 2016-02-11 ENCOUNTER — Other Ambulatory Visit: Payer: Self-pay | Admitting: Family Medicine

## 2016-03-24 ENCOUNTER — Other Ambulatory Visit: Payer: Self-pay | Admitting: *Deleted

## 2016-03-24 MED ORDER — CYANOCOBALAMIN 1000 MCG/ML IJ SOLN: INTRAMUSCULAR | 0 refills | Status: DC

## 2016-03-27 ENCOUNTER — Encounter: Payer: Self-pay | Admitting: Family Medicine

## 2016-03-27 ENCOUNTER — Ambulatory Visit (INDEPENDENT_AMBULATORY_CARE_PROVIDER_SITE_OTHER): Payer: Managed Care, Other (non HMO) | Admitting: Family Medicine

## 2016-03-27 VITALS — BP 150/84 | HR 85 | Temp 98.5°F | Wt 175.2 lb

## 2016-03-27 DIAGNOSIS — Z20828 Contact with and (suspected) exposure to other viral communicable diseases: Secondary | ICD-10-CM

## 2016-03-27 DIAGNOSIS — R6889 Other general symptoms and signs: Secondary | ICD-10-CM

## 2016-03-27 LAB — POC INFLUENZA A&B (BINAX/QUICKVUE)
Influenza A, POC: NEGATIVE
Influenza B, POC: NEGATIVE

## 2016-03-27 MED ORDER — OSELTAMIVIR PHOSPHATE 75 MG PO CAPS: 75.0000 mg | ORAL_CAPSULE | Freq: Two times a day (BID) | ORAL | 0 refills | Status: DC

## 2016-03-27 NOTE — Patient Instructions (Signed)
Flu test neg, presumed false negative.  Start tamiflu.  Rest and fluids.  Take care.  Glad to see you.  Update [place] as needed.

## 2016-03-27 NOTE — Progress Notes (Signed)
She is trying to quit smoking, I encouraged her.    Sx started this AM.  Quick onset.  Aches, sweats.  Taking advil for aches.  Mild cough.  ST.  No vomiting, no diarrhea.  Sick contacts noted, others dx'd with flu.  No ear pain but facial congestion.    Had a flu shot prev.    Meds, vitals, and allergies reviewed.   ROS: Per HPI unless specifically indicated in ROS section   GEN: nad, alert and oriented HEENT: mucous membranes moist, tm w/o erythema, nasal exam w/o erythema, clear discharge noted,  OP with cobblestoning NECK: supple w/o LA CV: rrr.   PULM: ctab, no inc wob EXT: no edema SKIN: no acute rash

## 2016-03-27 NOTE — Progress Notes (Signed)
Pre visit review using our clinic review tool, if applicable. No additional management support is needed unless otherwise documented below in the visit note. 

## 2016-03-30 DIAGNOSIS — R6889 Other general symptoms and signs: Secondary | ICD-10-CM | POA: Insufficient documentation

## 2016-03-30 NOTE — Assessment & Plan Note (Signed)
Nontoxic.  Flu test neg, presumed false negative.  Start tamiflu.  Rest and fluids.  Update [place] as needed.  She agrees. Routine cautions given to patient.

## 2016-05-01 ENCOUNTER — Other Ambulatory Visit: Payer: Self-pay | Admitting: Internal Medicine

## 2016-06-02 ENCOUNTER — Other Ambulatory Visit: Payer: Self-pay | Admitting: Internal Medicine

## 2016-06-03 NOTE — Telephone Encounter (Signed)
Ok symbicort 80, Inhale 2 ppuffs then rinse mouth, twice daily    # 1, refill x 12

## 2016-06-03 NOTE — Telephone Encounter (Signed)
Last OV: 08-19-15 follow up 1 year  Rx not on patient's med list. CY please advise. Thanks.

## 2016-06-06 ENCOUNTER — Other Ambulatory Visit: Payer: Self-pay | Admitting: Family Medicine

## 2016-07-03 ENCOUNTER — Other Ambulatory Visit: Payer: Self-pay | Admitting: Internal Medicine

## 2016-07-03 MED ORDER — FLUTICASONE FUROATE-VILANTEROL 100-25 MCG/INH IN AEPB: 1.0000 | INHALATION_SPRAY | Freq: Every day | RESPIRATORY_TRACT | 3 refills | Status: DC

## 2016-09-03 ENCOUNTER — Telehealth: Payer: Self-pay | Admitting: Family Medicine

## 2016-09-03 ENCOUNTER — Other Ambulatory Visit: Payer: Self-pay | Admitting: Family Medicine

## 2016-09-03 MED ORDER — CYANOCOBALAMIN 1000 MCG/ML IJ SOLN: INTRAMUSCULAR | 0 refills | Status: DC

## 2016-09-03 NOTE — Telephone Encounter (Signed)
Addressed through result notes  

## 2016-09-03 NOTE — Telephone Encounter (Signed)
Pt returned your call.  

## 2016-09-03 NOTE — Telephone Encounter (Signed)
appt scheduled and med filled  

## 2016-09-03 NOTE — Telephone Encounter (Signed)
Please schedule late summer f/u and refill until then thanks

## 2016-09-03 NOTE — Telephone Encounter (Signed)
Left voicemail requesting pt to call the office back 

## 2016-09-03 NOTE — Telephone Encounter (Signed)
Pt had an acute appt with another provider but no recent/future appts with PCP, please advise

## 2016-09-20 ENCOUNTER — Other Ambulatory Visit: Payer: Self-pay | Admitting: Family Medicine

## 2016-10-06 ENCOUNTER — Other Ambulatory Visit: Payer: Self-pay | Admitting: Internal Medicine

## 2016-10-08 NOTE — Telephone Encounter (Signed)
Ok to Rx 7 tablets, ref x 1

## 2016-10-08 NOTE — Telephone Encounter (Signed)
CY Please advise on refill. Thanks.  

## 2016-10-16 ENCOUNTER — Ambulatory Visit: Payer: Managed Care, Other (non HMO) | Admitting: Family Medicine

## 2016-10-19 ENCOUNTER — Other Ambulatory Visit: Payer: Self-pay | Admitting: Family Medicine

## 2016-12-03 ENCOUNTER — Other Ambulatory Visit: Payer: Self-pay | Admitting: Family Medicine

## 2016-12-03 ENCOUNTER — Encounter: Payer: Self-pay | Admitting: Family Medicine

## 2016-12-03 NOTE — Telephone Encounter (Signed)
Med refill appt scheduled and med refilled 

## 2016-12-07 ENCOUNTER — Telehealth: Payer: Self-pay | Admitting: Family Medicine

## 2016-12-07 MED ORDER — NITROFURANTOIN MONOHYD MACRO 100 MG PO CAPS: 100.0000 mg | ORAL_CAPSULE | Freq: Two times a day (BID) | ORAL | 0 refills | Status: DC

## 2016-12-07 NOTE — Telephone Encounter (Signed)
Please let pt know I sent the macrobid to her pharmacy

## 2016-12-07 NOTE — Telephone Encounter (Signed)
Spoke to pt and advised per Dr Glori Bickers.

## 2016-12-21 ENCOUNTER — Ambulatory Visit (INDEPENDENT_AMBULATORY_CARE_PROVIDER_SITE_OTHER): Payer: 59 | Admitting: Family Medicine

## 2016-12-21 ENCOUNTER — Encounter: Payer: Self-pay | Admitting: Family Medicine

## 2016-12-21 VITALS — BP 126/82 | HR 75 | Temp 98.2°F | Ht 64.0 in | Wt 174.2 lb

## 2016-12-21 DIAGNOSIS — E7800 Pure hypercholesterolemia, unspecified: Secondary | ICD-10-CM | POA: Diagnosis not present

## 2016-12-21 DIAGNOSIS — R7309 Other abnormal glucose: Secondary | ICD-10-CM

## 2016-12-21 DIAGNOSIS — Z23 Encounter for immunization: Secondary | ICD-10-CM | POA: Diagnosis not present

## 2016-12-21 DIAGNOSIS — F17200 Nicotine dependence, unspecified, uncomplicated: Secondary | ICD-10-CM | POA: Diagnosis not present

## 2016-12-21 DIAGNOSIS — E538 Deficiency of other specified B group vitamins: Secondary | ICD-10-CM

## 2016-12-21 DIAGNOSIS — I10 Essential (primary) hypertension: Secondary | ICD-10-CM

## 2016-12-21 LAB — LIPID PANEL
Cholesterol: 172 mg/dL (ref <200)
HDL: 43 mg/dL — ABNORMAL LOW (ref >50)
LDL Cholesterol (Calc): 99 mg/dL (calc)
Non-HDL Cholesterol (Calc): 129 mg/dL (calc) (ref <130)
Total CHOL/HDL Ratio: 4 (calc) (ref <5.0)
Triglycerides: 204 mg/dL — ABNORMAL HIGH (ref <150)

## 2016-12-21 LAB — VITAMIN B12: Vitamin B-12: 1565 pg/mL — ABNORMAL HIGH (ref 200–1100)

## 2016-12-21 MED ORDER — METOPROLOL SUCCINATE ER 25 MG PO TB24: 25.0000 mg | ORAL_TABLET | Freq: Every day | ORAL | 3 refills | Status: DC

## 2016-12-21 MED ORDER — HYDROCHLOROTHIAZIDE 25 MG PO TABS: 25.0000 mg | ORAL_TABLET | Freq: Every day | ORAL | 3 refills | Status: DC

## 2016-12-21 MED ORDER — ATORVASTATIN CALCIUM 10 MG PO TABS: 10.0000 mg | ORAL_TABLET | Freq: Every day | ORAL | 3 refills | Status: DC

## 2016-12-21 NOTE — Assessment & Plan Note (Signed)
Lab today  Atorvastatin and diet  Disc goals for lipids and reasons to control them Rev labs with pt (last draw)  Rev low sat fat diet in detail

## 2016-12-21 NOTE — Patient Instructions (Addendum)
Flu shot and Tdap today   Let's check check lipids and B12  I will review other labs and urine culture when they arrive   Try to get regular exercise  Drink lots of water   If urinary symptoms worsen please let us know

## 2016-12-21 NOTE — Assessment & Plan Note (Signed)
Gets a shot every month and also takes SL daily  B12 level today  Then refill (adj if needed)

## 2016-12-21 NOTE — Assessment & Plan Note (Signed)
bp in fair control at this time  BP Readings from Last 1 Encounters:  12/21/16 126/82   No changes needed Disc lifstyle change with low sodium diet and exercise  Labs today

## 2016-12-21 NOTE — Assessment & Plan Note (Signed)
Lab Results  Component Value Date   HGBA1C 5.9 07/22/2015   Re check today (had it as part of health fair and will fax it to me)  disc imp of low glycemic diet and wt loss to prevent DM2

## 2016-12-21 NOTE — Assessment & Plan Note (Signed)
Disc in detail risks of smoking and possible outcomes including copd, vascular/ heart disease, cancer , respiratory and sinus infections  Pt voices understanding No luck with chantix or wellbutrin  No luck with nicotine replacement She will keep working on it

## 2016-12-21 NOTE — Progress Notes (Signed)
Subjective:    Patient ID: Desiree Walters, female    DOB: 20-May-1970, 46 y.o.   MRN: [id number]  HPI  Here for f/u of chronic health problems   Wt Readings from Last 3 Encounters:  12/21/16 174 lb 4 oz (79 kg)  03/27/16 175 lb 4 oz (79.5 kg)  08/19/15 169 lb (76.7 kg)  lost weight -gained it back when dad died  Limited time to walk and also plantar fascitis  29.91 kg/m   Recently had uti that was multi drug resistant - tx with 14 d of macrobid  Mostly better overall  Re cultured her urine  Still has some burning  Takes cranberry  Drinking lots of water    Smoking status - about the same  She is on wellbutrin   Working a lot - for [employer]    bp is stable today  No cp or palpitations or headaches or edema  No side effects to medicines  BP Readings from Last 3 Encounters:  12/21/16 126/82  03/27/16 (!) 150/84  08/19/15 120/76     H/o B12 def Lab Results  Component Value Date   VITAMINB12 375 11/06/2015    Hyperlipidemia Lab Results  Component Value Date   CHOL 198 11/06/2015   HDL 44.70 11/06/2015   LDLCALC 119 (H) 11/06/2015   LDLDIRECT 149.0 07/22/2015   TRIG 174.0 (H) 11/06/2015   CHOLHDL 4 11/06/2015   On atorvastatin   Hyperglycemia Lab Results  Component Value Date   HGBA1C 5.9 07/22/2015  had that drawn today     Cbc/A1C/D/ cmet -- wellness check today  Not lipids or B12   Does SL B12 and monthly shots  Feels good energy wise   Patient Active Problem List   Diagnosis Date Noted  . Menopausal syndrome (hot flashes) 05/09/2014  . Encounter for screening mammogram for breast cancer 05/09/2014  . Other screening mammogram 04/10/2013  . Vitamin B12 deficiency 10/09/2009  . Hyperlipidemia 10/09/2009  . HYPERGLYCEMIA 10/09/2009  . RHINITIS 05/10/2008  . HYPERSOMNIA 09/26/2007  . MENORRHAGIA 11/10/2006  . HEADACHE 11/10/2006  . Obstructive sleep apnea 08/20/2006  . TOBACCO ABUSE 08/04/2006  . Essential  hypertension 08/04/2006  . OBSESSIVE-COMPULSIVE DISORDER 07/26/2006  . FIBROMYALGIA 07/26/2006   Past Medical History:  Diagnosis Date  . Diabetes mellitus   . Fibromyalgia   . Hyperlipemia   . Hypersomnia    NPSG 08-25-07-1.2/hr  . Obesity   . OCD (obsessive compulsive disorder)   . Pernicious anemia   . Tobacco abuse    Past Surgical History:  Procedure Laterality Date  . ABDOMINAL HYSTERECTOMY    . FOOT SURGERY  2005; 2008  . IUD - Merina  2008  . NASAL SEPTUM SURGERY     Social History  Substance Use Topics  . Smoking status: Current Every Day Smoker    Packs/day: 1.00    Years: 20.00    Types: Cigarettes  . Smokeless tobacco: Never Used  . Alcohol use 0.0 oz/week     Comment: rare   Family History  Problem Relation Age of Onset  . Hypertension Father   . Coronary artery disease Father   . Sleep apnea Father        with CPAP  . Heart disease Father        CAD  . Hyperlipidemia Father   . Hypertension Mother   . Hyperlipidemia Mother   . Hypertension Sister   . Hyperlipidemia Other  aunts/uncles  . Coronary artery disease Other        aunts/uncles   Allergies  Allergen Reactions  . Ciprofloxacin     REACTION: reaction not known  . Levofloxacin     REACTION: reaction not known  . Quinolones     Other reaction(s): Unknown  . Varenicline Tartrate     REACTION: headache   Current Outpatient Prescriptions on File Prior to Visit  Medication Sig Dispense Refill  . albuterol (PROVENTIL HFA;VENTOLIN HFA) 108 (90 Base) MCG/ACT inhaler Inhale 2 puffs into the lungs every 6 (six) hours as needed for wheezing or shortness of breath. 1 Inhaler 12  . buPROPion (WELLBUTRIN SR) 100 MG 12 hr tablet Take 1 tablet (100 mg total) by mouth 2 (two) times daily. 180 tablet 3  . clonazePAM (KLONOPIN) 0.5 MG tablet Take 1 tablet by mouth at bedtime.    . cyanocobalamin (,VITAMIN B-12,) 1000 MCG/ML injection INJECT 1 ML INTRAMUSCUALR ONCE A MONTH 3 mL 0  .  cyclobenzaprine (FLEXERIL) 10 MG tablet Take 1 tablet by mouth 3 (three) times daily as needed.    . fluconazole (DIFLUCAN) 150 MG tablet TAKE 1 TABLET EVERY DAY 7 tablet 1  . fluticasone furoate-vilanterol (BREO ELLIPTA) 100-25 MCG/INH AEPB Inhale 1 puff into the lungs daily. 3 each 3  . HYDROcodone-acetaminophen (NORCO) 10-325 MG per tablet Take 1 tablet by mouth every 4 (four) hours as needed.      . SYMBICORT 80-4.5 MCG/ACT inhaler INHALE 2 PUFFS INTO THE LUNGS 2 (TWO) TIMES DAILY. 10.2 g 11   No current facility-administered medications on file prior to visit.      Review of Systems  Constitutional: Negative for activity change, appetite change, fatigue, fever and unexpected weight change.  HENT: Negative for congestion, ear pain, rhinorrhea, sinus pressure and sore throat.   Eyes: Negative for pain, redness and visual disturbance.  Respiratory: Negative for cough, shortness of breath and wheezing.   Cardiovascular: Negative for chest pain and palpitations.  Gastrointestinal: Negative for abdominal pain, blood in stool, constipation and diarrhea.  Endocrine: Negative for polydipsia and polyuria.  Genitourinary: Negative for dysuria, frequency and urgency.  Musculoskeletal: Negative for arthralgias, back pain and myalgias.  Skin: Negative for pallor and rash.  Allergic/Immunologic: Negative for environmental allergies.  Neurological: Negative for dizziness, syncope and headaches.  Hematological: Negative for adenopathy. Does not bruise/bleed easily.  Psychiatric/Behavioral: Negative for decreased concentration and dysphoric mood. The patient is not nervous/anxious.        Pos for grief after loss of father  Overall doing fairly well        Objective:   Physical Exam  Constitutional: She appears well-developed and well-nourished. No distress.  overwt and well app  HENT:  Head: Normocephalic and atraumatic.  Mouth/Throat: Oropharynx is clear and moist.  Eyes: Pupils are equal,  round, and reactive to light. Conjunctivae and EOM are normal.  Neck: Normal range of motion. Neck supple. No JVD present. Carotid bruit is not present. No thyromegaly present.  Cardiovascular: Normal rate, regular rhythm, normal heart sounds and intact distal pulses.  Exam reveals no gallop.   Pulmonary/Chest: Effort normal and breath sounds normal. No respiratory distress. She has no wheezes. She has no rales.  No crackles  Good air exch No wheeze  Abdominal: Soft. Bowel sounds are normal. She exhibits no distension, no abdominal bruit and no mass. There is no tenderness.  Musculoskeletal: She exhibits no edema or tenderness.  Lymphadenopathy:    She has no cervical  adenopathy.  Neurological: She is alert. She has normal reflexes. No cranial nerve deficit. She exhibits normal muscle tone. Coordination normal.  Skin: Skin is warm and dry. No rash noted. No pallor.  Psychiatric: She has a normal mood and affect.          Assessment & Plan:   Problem List Items Addressed This Visit      Cardiovascular and Mediastinum   Essential hypertension - Primary    bp in fair control at this time  BP Readings from Last 1 Encounters:  12/21/16 126/82   No changes needed Disc lifstyle change with low sodium diet and exercise  Labs today       Relevant Medications   metoprolol succinate (TOPROL-XL) 25 MG 24 hr tablet   hydrochlorothiazide (HYDRODIURIL) 25 MG tablet   atorvastatin (LIPITOR) 10 MG tablet     Other   HYPERGLYCEMIA    Lab Results  Component Value Date   HGBA1C 5.9 07/22/2015   Re check today (had it as part of health fair and will fax it to me)  disc imp of low glycemic diet and wt loss to prevent DM2       Hyperlipidemia    Lab today  Atorvastatin and diet  Disc goals for lipids and reasons to control them Rev labs with pt (last draw)  Rev low sat fat diet in detail       Relevant Medications   metoprolol succinate (TOPROL-XL) 25 MG 24 hr tablet    hydrochlorothiazide (HYDRODIURIL) 25 MG tablet   atorvastatin (LIPITOR) 10 MG tablet   Other Relevant Orders   Lipid panel   TOBACCO ABUSE    Disc in detail risks of smoking and possible outcomes including copd, vascular/ heart disease, cancer , respiratory and sinus infections  Pt voices understanding No luck with chantix or wellbutrin  No luck with nicotine replacement She will keep working on it      Vitamin B12 deficiency    Gets a shot every month and also takes SL daily  B12 level today  Then refill (adj if needed)       Relevant Orders   Vitamin B12    Other Visit Diagnoses    Need for Tdap vaccination       Relevant Orders   Tdap vaccine greater than or equal to 7yo IM (Completed)   Need for influenza vaccination       Relevant Orders   Flu Vaccine QUAD 6+ mos PF IM (Fluarix Quad PF) (Completed)

## 2017-01-08 ENCOUNTER — Ambulatory Visit: Payer: Self-pay | Admitting: *Deleted

## 2017-01-08 MED ORDER — PREDNISONE 10 MG PO TABS: ORAL_TABLET | ORAL | 0 refills | Status: DC

## 2017-01-08 NOTE — Telephone Encounter (Signed)
I will send the prednisone to [facility] Finish the augmentin  Side effects include hunger/thirst /trouble sleeping   F/u if no imp  Hope it helps

## 2017-01-08 NOTE — Telephone Encounter (Signed)
Left voicemail letting pt know Rx sent to pharmacy  

## 2017-01-08 NOTE — Telephone Encounter (Signed)
Patient is calling to reports she has a sinus headache- she was treated for a sinus infection 4 days ago with Augmentin. She is using Musinex presently. She has had a sinus headache for 10 days and is requesting a prednisone dose pack for the inflammation. Patient does use pain medication on regular basis and has been taking them.  Reason for Disposition . [1] Taking antibiotic > 7 days AND [2] nasal discharge not improved  Answer Assessment - Initial Assessment Questions 1. LOCATION: "Where does it hurt?"      Behind eyes, fore head - up into the scalp 2. ONSET: "When did the sinus pain start?"  (e.g., hours, days)      10 days ago 3. SEVERITY: "How bad is the pain?"   (Scale 1-10; mild, moderate or severe)   - MILD (1-3): doesn't interfere with normal activities    - MODERATE (4-7): interferes with normal activities (e.g., work or school) or awakens from sleep   - SEVERE (8-10): excruciating pain and patient unable to do any normal activities        moderate 4. RECURRENT SYMPTOM: "Have you ever had sinus problems before?" If so, ask: "When was the last time?" and "What happened that time?"      Yes- 2005- treated with Prednisone- got better 5. NASAL CONGESTION: "Is the nose blocked?" If so, ask, "Can you open it or must you breathe through the mouth?"     Not so much- patient is using the nettie pot 6. NASAL DISCHARGE: "Do you have discharge from your nose?" If so ask, "What color?"     clear 7. FEVER: "Do you have a fever?" If so, ask: "What is it, how was it measured, and when did it start?"      Did have fever until Monday (7 days)- no fever since 8. OTHER SYMPTOMS: "Do you have any other symptoms?" (e.g., sore throat, cough, earache, difficulty breathing)     No- just heacache 9. PREGNANCY: "Is there any chance you are pregnant?" "When was your last menstrual period?"     No hyterectomy  Protocols used: SINUS INFECTION ON ANTIBIOTIC FOLLOW-UP CALL-A-AH, SINUS PAIN OR  CONGESTION-A-AH

## 2017-01-12 ENCOUNTER — Encounter: Payer: Self-pay | Admitting: *Deleted

## 2017-01-14 ENCOUNTER — Telehealth: Payer: Self-pay

## 2017-01-14 DIAGNOSIS — G8929 Other chronic pain: Secondary | ICD-10-CM

## 2017-01-14 DIAGNOSIS — R51 Headache: Principal | ICD-10-CM

## 2017-01-14 DIAGNOSIS — R519 Headache, unspecified: Secondary | ICD-10-CM

## 2017-01-14 NOTE — Telephone Encounter (Signed)
Copied from [facility] #[id number]. Topic: Referral - Request >> Jan 14, 2017  1:26 PM Ether Griffins B wrote: Reason for CRM: started prednisone 6 days ago for migraine. pts had the migraine for 16 days now and would like a referral to a neurologist

## 2017-01-14 NOTE — Telephone Encounter (Signed)
I did the referral -please let her know  Routed to [facility]

## 2017-01-15 NOTE — Telephone Encounter (Signed)
Called patient , sent Urgent Referral to [place],, patient aware that they will call her directly.

## 2017-01-19 ENCOUNTER — Encounter: Payer: Self-pay | Admitting: Neurology

## 2017-01-19 ENCOUNTER — Ambulatory Visit (INDEPENDENT_AMBULATORY_CARE_PROVIDER_SITE_OTHER): Payer: 59 | Admitting: Neurology

## 2017-01-19 ENCOUNTER — Other Ambulatory Visit: Payer: Self-pay | Admitting: Neurology

## 2017-01-19 VITALS — BP 121/82 | HR 80 | Ht 64.0 in | Wt 177.0 lb

## 2017-01-19 DIAGNOSIS — H547 Unspecified visual loss: Secondary | ICD-10-CM | POA: Diagnosis not present

## 2017-01-19 DIAGNOSIS — G959 Disease of spinal cord, unspecified: Secondary | ICD-10-CM

## 2017-01-19 DIAGNOSIS — G35 Multiple sclerosis: Secondary | ICD-10-CM | POA: Diagnosis not present

## 2017-01-19 DIAGNOSIS — R519 Headache, unspecified: Secondary | ICD-10-CM

## 2017-01-19 DIAGNOSIS — R51 Headache with orthostatic component, not elsewhere classified: Secondary | ICD-10-CM

## 2017-01-19 DIAGNOSIS — R202 Paresthesia of skin: Secondary | ICD-10-CM

## 2017-01-19 DIAGNOSIS — G43001 Migraine without aura, not intractable, with status migrainosus: Secondary | ICD-10-CM

## 2017-01-19 LAB — COMPLETE METABOLIC PANEL WITH GFR
AG Ratio: 1.8 (calc) (ref 1.0–2.5)
ALT: 15 U/L (ref 6–29)
AST: 13 U/L (ref 10–35)
Albumin: 4.4 g/dL (ref 3.6–5.1)
Alkaline phosphatase (APISO): 67 U/L (ref 33–115)
BUN: 14 mg/dL (ref 7–25)
CO2: 25 mmol/L (ref 20–32)
Calcium: 9.5 mg/dL (ref 8.6–10.2)
Chloride: 109 mmol/L (ref 98–110)
Creat: 0.94 mg/dL (ref 0.50–1.10)
GFR, Est African American: 85 mL/min/{1.73_m2} (ref >=60)
GFR, Est Non African American: 73 mL/min/{1.73_m2} (ref >=60)
Globulin: 2.4 g/dL (calc) (ref 1.9–3.7)
Glucose, Bld: 91 mg/dL (ref 65–139)
Potassium: 3.9 mmol/L (ref 3.5–5.3)
Sodium: 140 mmol/L (ref 135–146)
Total Bilirubin: 0.3 mg/dL (ref 0.2–1.2)
Total Protein: 6.8 g/dL (ref 6.1–8.1)

## 2017-01-19 LAB — CBC WITH DIFFERENTIAL/PLATELET
Basophils Absolute: 29 cells/uL (ref 0–200)
Basophils Relative: 0.4 %
Eosinophils Absolute: 180 cells/uL (ref 15–500)
Eosinophils Relative: 2.5 %
HCT: 41.3 % (ref 35.0–45.0)
Hemoglobin: 13.9 g/dL (ref 11.7–15.5)
Lymphs Abs: 2887 cells/uL (ref 850–3900)
MCH: 28 pg (ref 27.0–33.0)
MCHC: 33.7 g/dL (ref 32.0–36.0)
MCV: 83.1 fL (ref 80.0–100.0)
MPV: 12.5 fL (ref 7.5–12.5)
Monocytes Relative: 9.4 %
Neutro Abs: 3427 cells/uL (ref 1500–7800)
Neutrophils Relative %: 47.6 %
Platelets: 231 10*3/uL (ref 140–400)
RBC: 4.97 10*6/uL (ref 3.80–5.10)
RDW: 14.1 % (ref 11.0–15.0)
Total Lymphocyte: 40.1 %
WBC mixed population: 677 cells/uL (ref 200–950)
WBC: 7.2 10*3/uL (ref 3.8–10.8)

## 2017-01-19 LAB — TSH: TSH: 1.07 mIU/L

## 2017-01-19 MED ORDER — FROVATRIPTAN SUCCINATE 2.5 MG PO TABS: 2.5000 mg | ORAL_TABLET | ORAL | 3 refills | Status: DC | PRN

## 2017-01-19 NOTE — Patient Instructions (Signed)
MRI brain w/wo contrast Frova x 5 days   Migraine Headache A migraine headache is an intense, throbbing pain on one side or both sides of the head. Migraines may also cause other symptoms, such as nausea, vomiting, and sensitivity to light and noise. What are the causes? Doing or taking certain things may also trigger migraines, such as:  Alcohol.  Smoking.  Medicines, such as: ? Medicine used to treat chest pain (nitroglycerine). ? Birth control pills. ? Estrogen pills. ? Certain blood pressure medicines.  Aged cheeses, chocolate, or caffeine.  Foods or drinks that contain nitrates, glutamate, aspartame, or tyramine.  Physical activity.  Other things that may trigger a migraine include:  Menstruation.  Pregnancy.  Hunger.  Stress, lack of sleep, too much sleep, or fatigue.  Weather changes.  What increases the risk? The following factors may make you more likely to experience migraine headaches:  Age. Risk increases with age.  Family history of migraine headaches.  Being Caucasian.  Depression and anxiety.  Obesity.  Being a woman.  Having a hole in the heart (patent foramen ovale) or other heart problems.  What are the signs or symptoms? The main symptom of this condition is pulsating or throbbing pain. Pain may:  Happen in any area of the head, such as on one side or both sides.  Interfere with daily activities.  Get worse with physical activity.  Get worse with exposure to bright lights or loud noises.  Other symptoms may include:  Nausea.  Vomiting.  Dizziness.  General sensitivity to bright lights, loud noises, or smells.  Before you get a migraine, you may get warning signs that a migraine is developing (aura). An aura may include:  Seeing flashing lights or having blind spots.  Seeing bright spots, halos, or zigzag lines.  Having tunnel vision or blurred vision.  Having numbness or a tingling feeling.  Having trouble  talking.  Having muscle weakness.  How is this diagnosed? A migraine headache can be diagnosed based on:  Your symptoms.  A physical exam.  Tests, such as CT scan or MRI of the head. These imaging tests can help rule out other causes of headaches.  Taking fluid from the spine (lumbar puncture) and analyzing it (cerebrospinal fluid analysis, or CSF analysis).  How is this treated? A migraine headache is usually treated with medicines that:  Relieve pain.  Relieve nausea.  Prevent migraines from coming back.  Treatment may also include:  Acupuncture.  Lifestyle changes like avoiding foods that trigger migraines.  Follow these instructions at home: Medicines  Take over-the-counter and prescription medicines only as told by your health care provider.  Do not drive or use heavy machinery while taking prescription pain medicine.  To prevent or treat constipation while you are taking prescription pain medicine, your health care provider may recommend that you: ? Drink enough fluid to keep your urine clear or pale yellow. ? Take over-the-counter or prescription medicines. ? Eat foods that are high in fiber, such as fresh fruits and vegetables, whole grains, and beans. ? Limit foods that are high in fat and processed sugars, such as fried and sweet foods. Lifestyle  Avoid alcohol use.  Do not use any products that contain nicotine or tobacco, such as cigarettes and e-cigarettes. If you need help quitting, ask your health care provider.  Get at least 8 hours of sleep every night.  Limit your stress. General instructions   Keep a journal to find out what may trigger your migraine  headaches. For example, write down: ? What you eat and drink. ? How much sleep you get. ? Any change to your diet or medicines.  If you have a migraine: ? Avoid things that make your symptoms worse, such as bright lights. ? It may help to lie down in a dark, quiet room. ? Do not drive or  use heavy machinery. ? Ask your health care provider what activities are safe for you while you are experiencing symptoms.  Keep all follow-up visits as told by your health care provider. This is important. Contact a health care provider if:  You develop symptoms that are different or more severe than your usual migraine symptoms. Get help right away if:  Your migraine becomes severe.  You have a fever.  You have a stiff neck.  You have vision loss.  Your muscles feel weak or like you cannot control them.  You start to lose your balance often.  You develop trouble walking.  You faint. This information is not intended to replace advice given to you by your health care provider. Make sure you discuss any questions you have with your health care provider. Document Released: 03/02/2005 Document Revised: 09/20/2015 Document Reviewed: 08/19/2015 Elsevier Interactive Patient Education  2017 [employer].

## 2017-01-19 NOTE — Progress Notes (Signed)
[facility]    Provider:  Dr Jaynee Eagles Referring Provider: Abner Greenspan, MD Primary Care Physician:  Tower, Wynelle Fanny, MD  CC:  migraines  HPI:  Desiree Walters is a 46 y.o. female here as a referral from Dr. Glori Bickers for migraines. PMHx tobacco abuse, pernicious anemia, OCD, obesity, HLD, hypersomnia, Fibromyalgia, DM. She had a virus 21 days ago and now have a headache for 21 days. She was treated with augmentin, she had fever and sinus pressure, she tried prednisone, it didn't help. Most of the day she is fine, the headache feels like she has a skull cap on, pressure, if she bends over she feels like she is going to pass out with worse pain, pounding, throbbing, light sensitivity and sound sensitivity. Not her normal migraines, moving makes it worse at night, she is lightheaded and nauseated. Better in the morning, gradually gets worse over the course of the day.  It has been years since she had a headache, but it is the same possibly she used to get status migrainosus. 9 days of steroids did not help. Took a little of the edge off. She has tried gabapentin, she took Topiramate from 5-6 years ago and made her dizzy. She takes oxycodone every day for chronic pain and has been told she maybe ha MS. She took an excedrin migraine and it worked during the day but the headache returned. No other focal neurologic deficits, associated symptoms, inciting events or modifiable factors. She has chronic low back pain.  Reviewed notes, labs and imaging from outside physicians, which showed:  MRI lumbar spine 05/2007:  IMPRESSION:  1. Shallow right foraminal disc protrusion at L4-5 could irritate the right L-4 nerve root. No significant spinal or lateral recess stenosis in the lumbar spine.   2. Benign-appearing Tarlov cyst behind S-2.   3. Incidental note is made of an exophytic lesion off of the fundal region of the uterus. It may just be a degenerated fibroid but the signal characteristics  are not typical. Dedicated MRI of the pelvis may be helpful for better evaluation of this abnormality.  Review of Systems: Patient complains of symptoms per HPI as well as the following symptoms: headache, low back pain. Pertinent negatives and positives per HPI. All others negative.   Social History   Socioeconomic History  . Marital status: Married    Spouse name: Not on file  . Number of children: Not on file  . Years of education: Not on file  . Highest education level: Not on file  Social Needs  . Financial resource strain: Not on file  . Food insecurity - worry: Not on file  . Food insecurity - inability: Not on file  . Transportation needs - medical: No  . Transportation needs - non-medical: No  Occupational History  . Occupation: med Estate agent  Tobacco Use  . Smoking status: Current Every Day Smoker    Packs/day: 1.00    Years: 20.00    Pack years: 20.00    Types: Cigarettes  . Smokeless tobacco: Never Used  Substance and Sexual Activity  . Alcohol use: Yes    Alcohol/week: 0.0 oz    Comment: rare  . Drug use: No  . Sexual activity: Not on file  Other Topics Concern  . Not on file  Social History Narrative   Bonnita Nasuti Aheron's daughter   Daughter is unmarried daughter; drug problem   Cares for grandchild   Right handed   7 cups of  caffeine daily          Family History  Problem Relation Age of Onset  . Hypertension Father   . Coronary artery disease Father   . Sleep apnea Father        with CPAP  . Heart disease Father        CAD  . Hyperlipidemia Father   . Hypertension Mother   . Hyperlipidemia Mother   . Hypertension Sister   . Hyperlipidemia Other        aunts/uncles  . Coronary artery disease Other        aunts/uncles  . Stroke Maternal Grandmother   . Multiple sclerosis Neg Hx     Past Medical History:  Diagnosis Date  . Diabetes mellitus   . Fibromyalgia   . Hyperlipemia   . Hypersomnia    NPSG 08-25-07-1.2/hr  . Obesity     . OCD (obsessive compulsive disorder)   . Pernicious anemia   . Tobacco abuse     Past Surgical History:  Procedure Laterality Date  . ABDOMINAL HYSTERECTOMY    . BREAST ENHANCEMENT SURGERY  2003  . FOOT SURGERY  2005; 2008  . IUD - [place]  2008  . NASAL SEPTUM SURGERY      Current Outpatient Medications  Medication Sig Dispense Refill  . albuterol (PROVENTIL HFA;VENTOLIN HFA) 108 (90 Base) MCG/ACT inhaler Inhale 2 puffs into the lungs every 6 (six) hours as needed for wheezing or shortness of breath. 1 Inhaler 12  . atorvastatin (LIPITOR) 10 MG tablet Take 1 tablet (10 mg total) by mouth daily. In the evening 90 tablet 3  . buPROPion (WELLBUTRIN SR) 100 MG 12 hr tablet Take 1 tablet (100 mg total) by mouth 2 (two) times daily. 180 tablet 3  . clonazePAM (KLONOPIN) 0.5 MG tablet Take 1 tablet by mouth at bedtime.    . cyanocobalamin (,VITAMIN B-12,) 1000 MCG/ML injection INJECT 1 ML INTRAMUSCUALR ONCE A MONTH 3 mL 0  . cyclobenzaprine (FLEXERIL) 10 MG tablet Take 1 tablet by mouth 3 (three) times daily as needed.    . fluconazole (DIFLUCAN) 150 MG tablet TAKE 1 TABLET EVERY DAY 7 tablet 1  . fluticasone furoate-vilanterol (BREO ELLIPTA) 100-25 MCG/INH AEPB Inhale 1 puff into the lungs daily. 3 each 3  . frovatriptan (FROVA) 2.5 MG tablet Take 1 tablet (2.5 mg total) as needed by mouth for migraine. If recurs, may repeat after 2 hours. Max of 3 tabs in 24 hours. 10 tablet 3  . hydrochlorothiazide (HYDRODIURIL) 25 MG tablet Take 1 tablet (25 mg total) by mouth daily. 90 tablet 3  . HYDROcodone-acetaminophen (NORCO) 10-325 MG per tablet Take 1 tablet by mouth every 4 (four) hours as needed.      . metoprolol succinate (TOPROL-XL) 25 MG 24 hr tablet Take 1 tablet (25 mg total) by mouth daily. 90 tablet 3  . predniSONE (DELTASONE) 10 MG tablet Take 3 pills once daily by mouth for 3 days, then 2 pills once daily for 3 days, then 1 pill once daily for 3 days and then stop 18 tablet 0  .  SYMBICORT 80-4.5 MCG/ACT inhaler INHALE 2 PUFFS INTO THE LUNGS 2 (TWO) TIMES DAILY. 10.2 g 11   No current facility-administered medications for this visit.     Allergies as of 01/19/2017 - Review Complete 01/19/2017  Allergen Reaction Noted  . Ciprofloxacin  07/26/2006  . Levofloxacin  07/26/2006  . Quinolones  08/19/2015  . Varenicline tartrate  08/04/2006  Vitals: BP 121/82 (BP Location: Right Arm, Patient Position: Sitting)   Pulse 80   Ht 5\' 4"  (1.626 m)   Wt 177 lb (80.3 kg)   BMI 30.38 kg/m  Last Weight:  Wt Readings from Last 1 Encounters:  01/19/17 177 lb (80.3 kg)   Last Height:   Ht Readings from Last 1 Encounters:  01/19/17 5\' 4"  (1.626 m)    Physical exam: Exam: Gen: NAD, conversant, well nourised, obese, well groomed                     CV: RRR, no MRG. No Carotid Bruits. No peripheral edema, warm, nontender Eyes: Conjunctivae clear without exudates or hemorrhage  Neuro: Detailed Neurologic Exam  Speech:    Speech is normal; fluent and spontaneous with normal comprehension.  Cognition:    The patient is oriented to person, place, and time;     recent and remote memory intact;     language fluent;     normal attention, concentration,     fund of knowledge Cranial Nerves:    The pupils are equal, round, and reactive to light. The fundi are normal and spontaneous venous pulsations are present. Visual fields are full to finger confrontation. Extraocular movements are intact. Trigeminal sensation is intact and the muscles of mastication are normal. The face is symmetric. The palate elevates in the midline. Hearing intact. Voice is normal. Shoulder shrug is normal. The tongue has normal motion without fasciculations.   Coordination:    Normal finger to nose and heel to shin. Normal rapid alternating movements.   Gait:    Heel-toe and tandem gait are normal.   Motor Observation:    No asymmetry, no atrophy, and no involuntary movements noted. Tone:     Normal muscle tone.    Posture:    Posture is normal. normal erect    Strength:    Strength is V/V in the upper and lower limbs.      Sensation: intact to LT     Reflex Exam:  DTR's:    Deep tendon reflexes in the upper and lower extremities are normal bilaterally.   Toes:    The toes are downgoing bilaterally.   Clonus:    Clonus is absent.      Assessment/Plan:  Patient with acute continuous headache, intractable, positional in quality with a possible Hx of MS, paresthesias, vision changes. Need MRI of the brain to evaluate for new lesion, MS, space occupying mass especially given positional quality and MRI cervical spine to eval for new event in MS and paresthesias. Frova x 5 days for headaches which may be migraines if above unremarkable.  Orders Placed This Encounter  Procedures  . MR BRAIN W WO CONTRAST  . MR CERVICAL SPINE W WO CONTRAST  . Comprehensive metabolic panel  . CBC  . TSH     Sarina Ill, MD  [facility] [address] [place], [place] [address]  Phone [phone] Fax [phone]

## 2017-01-21 ENCOUNTER — Telehealth: Payer: Self-pay | Admitting: *Deleted

## 2017-01-21 ENCOUNTER — Encounter: Payer: Self-pay | Admitting: Neurology

## 2017-01-21 DIAGNOSIS — G43009 Migraine without aura, not intractable, without status migrainosus: Secondary | ICD-10-CM | POA: Insufficient documentation

## 2017-01-21 NOTE — Telephone Encounter (Signed)
-----   Message from Melvenia Beam, MD sent at 01/20/2017  1:02 PM EST ----- Labs normal thanks

## 2017-01-21 NOTE — Telephone Encounter (Signed)
Called and LVM asking for call back.   Unable to leave details. When pt calls back, please let her know that her labs are normal.

## 2017-01-22 ENCOUNTER — Encounter: Payer: Self-pay | Admitting: Neurology

## 2017-01-22 ENCOUNTER — Telehealth: Payer: Self-pay | Admitting: *Deleted

## 2017-01-22 ENCOUNTER — Telehealth: Payer: Self-pay | Admitting: Neurology

## 2017-01-22 NOTE — Telephone Encounter (Signed)
Called and LVM (ok per DPR) that pt's labs are normal and the message does not require a call back but if she has any questions she can call our office.

## 2017-01-22 NOTE — Telephone Encounter (Signed)
Pt returned RN's call, msg relayed. Pt had no questions

## 2017-01-22 NOTE — Telephone Encounter (Signed)
error 

## 2017-01-22 NOTE — Telephone Encounter (Signed)
-----   Message from Melvenia Beam, MD sent at 01/20/2017  1:02 PM EST ----- Labs normal thanks

## 2017-01-25 ENCOUNTER — Telehealth: Payer: Self-pay | Admitting: Neurology

## 2017-01-25 NOTE — Telephone Encounter (Signed)
Patient has had a migraine for 27 days. Can something be called to [facility] in [place].She tried Frova recently but did not help. Please call and discuss.

## 2017-01-25 NOTE — Telephone Encounter (Signed)
Called patient back and LVM (ok per DPR) asking for call back. I informed her that our office is now closed but reopens tomorrow at 8 AM. I advised her to make sure she gets her MRI scheduled as having a migraine that long, we really need to get the MRI. I also let her know that per Dr. Jaynee Eagles, she can come in tomorrow for an infusion/migraine cocktail.

## 2017-02-07 ENCOUNTER — Ambulatory Visit
Admission: RE | Admit: 2017-02-07 | Discharge: 2017-02-07 | Disposition: A | Discharge status: Home / Self Care | Payer: 59 | Source: Ambulatory Visit | Attending: Neurology | Admitting: Neurology

## 2017-02-07 DIAGNOSIS — G35 Multiple sclerosis: Secondary | ICD-10-CM

## 2017-02-07 DIAGNOSIS — R519 Headache, unspecified: Secondary | ICD-10-CM

## 2017-02-07 DIAGNOSIS — R51 Headache with orthostatic component, not elsewhere classified: Secondary | ICD-10-CM

## 2017-02-07 DIAGNOSIS — G959 Disease of spinal cord, unspecified: Secondary | ICD-10-CM

## 2017-02-07 DIAGNOSIS — R202 Paresthesia of skin: Secondary | ICD-10-CM | POA: Diagnosis not present

## 2017-02-07 DIAGNOSIS — H547 Unspecified visual loss: Secondary | ICD-10-CM

## 2017-02-07 MED ORDER — GADOBENATE DIMEGLUMINE 529 MG/ML IV SOLN
16.0000 mL | Freq: Once | INTRAVENOUS | Status: AC | PRN
  Administered 2017-02-07: 16 mL via INTRAVENOUS

## 2017-02-09 ENCOUNTER — Telehealth: Payer: Self-pay | Admitting: *Deleted

## 2017-02-09 ENCOUNTER — Encounter: Payer: Self-pay | Admitting: Neurology

## 2017-02-09 NOTE — Telephone Encounter (Signed)
I would recommend trying Quedexy, it is a much better formulation of topiramate and has much less side effects and is more effective. There is a $0 copay and I could leaver her samples to start. thanks

## 2017-02-09 NOTE — Telephone Encounter (Signed)
Called patient and discussed trying Qudexy including benefits mentioned by Dr. Jaynee Eagles. Patient would like to try Qudexy. She states she will look up information about it. She prefers for [place] to put the order in and call in the $0 copay card if possible as opposed to coming to office for samples. She wants to use [facility] on [address]

## 2017-02-09 NOTE — Telephone Encounter (Addendum)
Called and patient and discussed all information listed below regarding MRI brain & MRI cervical spine results. She verbalized understanding. Patient inquired about where we need to go from here. She stated she still is having the headaches, worse at night, but not unbearable. She takes her medications. Told her I would pass message to Dr. Jaynee Eagles and get back to her. She verbalized understanding and appreciation.     ----- Message from Melvenia Beam, MD sent at 02/09/2017  8:29 AM EST ----- MRI of the cervical spine shows some mild arthritic/degenerative changes the spine(normal in aging), but the spinal cord is normal without anything suspicious for MS  MRI of the brain unremarkable, no lesions suspicious for MS.

## 2017-02-10 ENCOUNTER — Encounter: Payer: Self-pay | Admitting: *Deleted

## 2017-02-10 MED ORDER — TOPIRAMATE ER 50 MG PO SPRINKLE CAP24: 50.0000 mg | EXTENDED_RELEASE_CAPSULE | Freq: Every day | ORAL | 2 refills | Status: DC

## 2017-02-10 NOTE — Telephone Encounter (Addendum)
Per Dr. Jaynee Eagles, can give 3 months supply. Patient to call in 4 weeks and if not having side effects, we can increase the dose. Prescription signed by MD & faxed to pharmacy. [facility] $0 copay card information included in the prescription. Received a receipt of confirmation. Emailed patient through [employer] to advise her of the above information including most common side effects.

## 2017-02-10 NOTE — Telephone Encounter (Signed)
Would you prescribe 50mg  Qudexy and call in a copay card please? Tell her to start at 50mg  and if tolerated, the next prescription we will increase to 100mg  thanks

## 2017-02-10 NOTE — Addendum Note (Signed)
Addended by: Gildardo Griffes on: 02/10/2017 12:22 PM   Modules accepted: Orders

## 2017-02-15 ENCOUNTER — Encounter: Payer: Self-pay | Admitting: Family Medicine

## 2017-02-15 ENCOUNTER — Other Ambulatory Visit: Payer: Self-pay | Admitting: Family Medicine

## 2017-02-15 NOTE — Telephone Encounter (Signed)
Rx sent, and pt notified of Dr. Marliss Coots instructions. Pt will get a urine cx done at work and fax [place] the results.

## 2017-02-15 NOTE — Telephone Encounter (Signed)
See pt's mychart message

## 2017-02-15 NOTE — Telephone Encounter (Signed)
Please call and ask her to get a urine culture (at work or drop off urine here)  Then refill macrobid for 7 d supply   Will make a long term plan when we get the cx result Thanks

## 2017-03-10 ENCOUNTER — Encounter: Payer: Self-pay | Admitting: Neurology

## 2017-03-10 ENCOUNTER — Other Ambulatory Visit: Payer: Self-pay | Admitting: Family Medicine

## 2017-03-12 ENCOUNTER — Encounter: Payer: Self-pay | Admitting: *Deleted

## 2017-03-12 ENCOUNTER — Telehealth: Payer: Self-pay | Admitting: Neurology

## 2017-03-12 MED ORDER — TOPIRAMATE ER 100 MG PO SPRINKLE CAP24: 100.0000 mg | EXTENDED_RELEASE_CAPSULE | Freq: Every day | ORAL | 11 refills | Status: DC

## 2017-03-12 NOTE — Telephone Encounter (Signed)
Desiree Walters, can you call a prescription to the pharmacy please 100mg  Qudexy refill 11? See previous way (below) we did this, do you need to call again about the copay card or just electronic?   02/10/2017 note: Per Dr. Jaynee Eagles, can give 3 months supply. Patient to call in 4 weeks and if not having side effects, we can increase the dose. Prescription signed by MD & faxed to pharmacy. [employer] [facility] $0 copay card information included in the prescription. Received a receipt of confirmation. Emailed patient through [place] to advise her of the above information including most common side effects.

## 2017-03-12 NOTE — Addendum Note (Signed)
Addended by: Gildardo Griffes on: 03/12/2017 09:50 AM   Modules accepted: Orders

## 2017-03-12 NOTE — Telephone Encounter (Signed)
Pt responded via email.

## 2017-03-12 NOTE — Telephone Encounter (Signed)
I'm not sure, can you call and ask her and if so maybe another doc can sign it?

## 2017-03-12 NOTE — Telephone Encounter (Signed)
I ordered Qudexy 100 mg capsule daily HS. I included the savings card information in the pharmacy comments and it printed the prescription for signature.

## 2017-03-12 NOTE — Telephone Encounter (Signed)
Called patient and LVM (ok per DPR) informing patient that the prescription printed and Dr. Jaynee Eagles won't be able to sign it until Wednesday. I asked the patient to call us back and let us know if she has enough medication to last until then. I included our office hours and the number to call back.

## 2017-03-17 NOTE — Telephone Encounter (Signed)
Faxed signed Qudexy ER 100 mg prescription to pt's pharmacy. Received a receipt of confirmation. Patient emailed to make aware.

## 2017-05-07 ENCOUNTER — Other Ambulatory Visit: Payer: Self-pay | Admitting: Neurology

## 2017-05-07 MED ORDER — TOPIRAMATE ER 100 MG PO SPRINKLE CAP24: 100.0000 mg | EXTENDED_RELEASE_CAPSULE | Freq: Every day | ORAL | 2 refills | Status: DC

## 2017-05-10 ENCOUNTER — Telehealth: Payer: Self-pay | Admitting: *Deleted

## 2017-05-10 NOTE — Telephone Encounter (Signed)
Faxed Qudexy XR 100 mg signed prescription to pharmacy. Received a receipt of confirmation.

## 2017-09-14 ENCOUNTER — Emergency Department
Admission: EM | Admit: 2017-09-14 | Discharge: 2017-09-14 | Disposition: A | Discharge status: Home / Self Care | Payer: 59 | Attending: Student in an Organized Health Care Education/Training Program | Admitting: Student in an Organized Health Care Education/Training Program

## 2017-09-14 ENCOUNTER — Encounter: Payer: Self-pay | Admitting: Emergency Medicine

## 2017-09-14 DIAGNOSIS — Z79899 Other long term (current) drug therapy: Secondary | ICD-10-CM | POA: Insufficient documentation

## 2017-09-14 DIAGNOSIS — E119 Type 2 diabetes mellitus without complications: Secondary | ICD-10-CM | POA: Diagnosis not present

## 2017-09-14 DIAGNOSIS — I10 Essential (primary) hypertension: Secondary | ICD-10-CM | POA: Diagnosis not present

## 2017-09-14 DIAGNOSIS — J029 Acute pharyngitis, unspecified: Secondary | ICD-10-CM | POA: Diagnosis not present

## 2017-09-14 DIAGNOSIS — F17210 Nicotine dependence, cigarettes, uncomplicated: Secondary | ICD-10-CM | POA: Insufficient documentation

## 2017-09-14 LAB — BASIC METABOLIC PANEL
Anion gap: 8 (ref 5–15)
BUN: 14 mg/dL (ref 6–20)
CO2: 24 mmol/L (ref 22–32)
Calcium: 9.1 mg/dL (ref 8.9–10.3)
Chloride: 111 mmol/L (ref 98–111)
Creatinine, Ser: 0.67 mg/dL (ref 0.44–1.00)
GFR calc Af Amer: >60 mL/min (ref >60)
GFR calc non Af Amer: >60 mL/min (ref >60)
Glucose, Bld: 103 mg/dL — ABNORMAL HIGH (ref 70–99)
Potassium: 3.8 mmol/L (ref 3.5–5.1)
Sodium: 143 mmol/L (ref 135–145)

## 2017-09-14 LAB — MONONUCLEOSIS SCREEN: Mono Screen: NEGATIVE

## 2017-09-14 MED ORDER — AMOXICILLIN-POT CLAVULANATE 875-125 MG PO TABS: 1.0000 | ORAL_TABLET | Freq: Two times a day (BID) | ORAL | 0 refills | Status: AC

## 2017-09-14 MED ORDER — PREDNISONE 20 MG PO TABS: 40.0000 mg | ORAL_TABLET | Freq: Every day | ORAL | 0 refills | Status: AC

## 2017-09-14 MED ORDER — AMOXICILLIN-POT CLAVULANATE 875-125 MG PO TABS
1.0000 | ORAL_TABLET | Freq: Once | ORAL | Status: AC
  Administered 2017-09-14: 1 via ORAL
  Filled 2017-09-14: qty 1

## 2017-09-14 MED ORDER — LIDOCAINE HCL (PF) 4 % IJ SOLN
5.0000 mL | Freq: Once | INTRAMUSCULAR | Status: AC
  Administered 2017-09-14: 5 mL via RESPIRATORY_TRACT
  Filled 2017-09-14: qty 5

## 2017-09-14 MED ORDER — KETOROLAC TROMETHAMINE 30 MG/ML IJ SOLN
INTRAMUSCULAR | Status: AC
  Filled 2017-09-14: qty 1

## 2017-09-14 MED ORDER — ONDANSETRON HCL 4 MG/2ML IJ SOLN
4.0000 mg | Freq: Once | INTRAMUSCULAR | Status: AC
  Administered 2017-09-14: 4 mg via INTRAVENOUS

## 2017-09-14 MED ORDER — DEXAMETHASONE SODIUM PHOSPHATE 10 MG/ML IJ SOLN
10.0000 mg | Freq: Once | INTRAMUSCULAR | Status: AC
  Administered 2017-09-14: 10 mg via INTRAVENOUS

## 2017-09-14 MED ORDER — SODIUM CHLORIDE 0.9 % IV BOLUS
1000.0000 mL | Freq: Once | INTRAVENOUS | Status: AC
  Administered 2017-09-14: 1000 mL via INTRAVENOUS

## 2017-09-14 MED ORDER — HYDROCODONE-ACETAMINOPHEN 5-325 MG PO TABS: 1.0000 | ORAL_TABLET | ORAL | 0 refills | Status: DC | PRN

## 2017-09-14 MED ORDER — ONDANSETRON HCL 4 MG/2ML IJ SOLN
INTRAMUSCULAR | Status: AC
  Administered 2017-09-14: 4 mg via INTRAVENOUS
  Filled 2017-09-14: qty 2

## 2017-09-14 MED ORDER — CLINDAMYCIN PHOSPHATE 900 MG/50ML IV SOLN
900.0000 mg | Freq: Once | INTRAVENOUS | Status: AC
  Administered 2017-09-14: 900 mg via INTRAVENOUS
  Filled 2017-09-14: qty 50

## 2017-09-14 MED ORDER — MORPHINE SULFATE (PF) 4 MG/ML IV SOLN
4.0000 mg | INTRAVENOUS | Status: DC | PRN
  Administered 2017-09-14: 4 mg via INTRAVENOUS

## 2017-09-14 MED ORDER — MORPHINE SULFATE (PF) 4 MG/ML IV SOLN
INTRAVENOUS | Status: AC
Start: 2017-09-14 — End: 2017-09-14
  Administered 2017-09-14: 4 mg via INTRAVENOUS
  Filled 2017-09-14: qty 1

## 2017-09-14 MED ORDER — FLUCONAZOLE 150 MG PO TABS: 150.0000 mg | ORAL_TABLET | Freq: Every day | ORAL | 0 refills | Status: DC

## 2017-09-14 MED ORDER — DEXAMETHASONE SODIUM PHOSPHATE 10 MG/ML IJ SOLN
INTRAMUSCULAR | Status: AC
  Administered 2017-09-14: 10 mg via INTRAVENOUS
  Filled 2017-09-14: qty 1

## 2017-09-14 MED ORDER — KETOROLAC TROMETHAMINE 30 MG/ML IJ SOLN
15.0000 mg | Freq: Once | INTRAMUSCULAR | Status: AC
  Administered 2017-09-14: 15 mg via INTRAVENOUS

## 2017-09-14 NOTE — ED Provider Notes (Signed)
[facility] Emergency Department Provider Note    First MD Initiated Contact with Patient 09/14/17 1509     (approximate)  I have reviewed the triage vital signs and the nursing notes.   HISTORY  Chief Complaint Sore Throat    HPI Desiree Walters is a 47 y.o. female history diabetes, fibromyalgia presents the ER with chief complaint of 3 days of worsening right-sided sore throat.  No fevers at home.  No previous history of throat or oropharyngeal surgeries.  No recent sick contacts.  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  States that she did 2 rapid strep test on herself both of which were negative.  States she is having worsening pain when swallowing and has not had any relief with taking Vicodin.  No trouble breathing.  No change in her voice.  Pain is primarily on the right side and described as moderate to severe in nature.   Past Medical History:  Diagnosis Date  . Diabetes mellitus   . Fibromyalgia   . Hyperlipemia   . Hypersomnia    NPSG 08-25-07-1.2/hr  . Obesity   . OCD (obsessive compulsive disorder)   . Pernicious anemia   . Tobacco abuse    Family History  Problem Relation Age of Onset  . Hypertension Father   . Coronary artery disease Father   . Sleep apnea Father        with CPAP  . Heart disease Father        CAD  . Hyperlipidemia Father   . Hypertension Mother   . Hyperlipidemia Mother   . Hypertension Sister   . Hyperlipidemia Other        aunts/uncles  . Coronary artery disease Other        aunts/uncles  . Stroke Maternal Grandmother   . Multiple sclerosis Neg Hx    Past Surgical History:  Procedure Laterality Date  . ABDOMINAL HYSTERECTOMY    . BREAST ENHANCEMENT SURGERY  2003  . FOOT SURGERY  2005; 2008  . IUD - Merina  2008  . NASAL SEPTUM SURGERY     Patient Active Problem List   Diagnosis Date Noted  . Migraine without aura 01/21/2017  . Menopausal syndrome (hot flashes) 05/09/2014  .  Encounter for screening mammogram for breast cancer 05/09/2014  . Other screening mammogram 04/10/2013  . Vitamin B12 deficiency 10/09/2009  . Hyperlipidemia 10/09/2009  . HYPERGLYCEMIA 10/09/2009  . RHINITIS 05/10/2008  . HYPERSOMNIA 09/26/2007  . MENORRHAGIA 11/10/2006  . Intractable headache 11/10/2006  . Obstructive sleep apnea 08/20/2006  . TOBACCO ABUSE 08/04/2006  . Essential hypertension 08/04/2006  . OBSESSIVE-COMPULSIVE DISORDER 07/26/2006  . FIBROMYALGIA 07/26/2006      Prior to Admission medications   Medication Sig Start Date End Date Taking? Authorizing Provider  albuterol (PROVENTIL HFA;VENTOLIN HFA) 108 (90 Base) MCG/ACT inhaler Inhale 2 puffs into the lungs every 6 (six) hours as needed for wheezing or shortness of breath. 08/19/15   Baird Lyons D, MD  atorvastatin (LIPITOR) 10 MG tablet Take 1 tablet (10 mg total) by mouth daily. In the evening 12/21/16   Tower, Wynelle Fanny, MD  buPROPion [facility] SR) 100 MG 12 hr tablet Take 1 tablet (100 mg total) by mouth 2 (two) times daily. 02/05/16   Deneise Lever, MD  clonazePAM (KLONOPIN) 0.5 MG tablet Take 1 tablet by mouth at bedtime. 12/14/12   [provider]  cyanocobalamin (,VITAMIN B-12,) 1000 MCG/ML injection INJECT 1 ML IM ONCE  A MONTH 03/11/17   Tower, Wynelle Fanny, MD  cyclobenzaprine (FLEXERIL) 10 MG tablet Take 1 tablet by mouth 3 (three) times daily as needed. 06/25/15   [provider]  fluconazole (DIFLUCAN) 150 MG tablet TAKE 1 TABLET EVERY DAY 10/08/16   Young, Clinton D, MD  fluticasone furoate-vilanterol (BREO ELLIPTA) 100-25 MCG/INH AEPB Inhale 1 puff into the lungs daily. 07/03/16   Baird Lyons D, MD  frovatriptan (FROVA) 2.5 MG tablet Take 1 tablet (2.5 mg total) as needed by mouth for migraine. If recurs, may repeat after 2 hours. Max of 3 tabs in 24 hours. 01/19/17   Melvenia Beam, MD  hydrochlorothiazide (HYDRODIURIL) 25 MG tablet Take 1 tablet (25 mg total) by mouth daily. 12/21/16    Tower, Wynelle Fanny, MD  HYDROcodone-acetaminophen (NORCO) 10-325 MG per tablet Take 1 tablet by mouth every 4 (four) hours as needed.      [provider]  metoprolol succinate (TOPROL-XL) 25 MG 24 hr tablet Take 1 tablet (25 mg total) by mouth daily. 12/21/16   Tower, Wynelle Fanny, MD  nitrofurantoin, macrocrystal-monohydrate, (MACROBID) 100 MG capsule TAKE 1 CAPSULE BY MOUTH TWICE A DAY 02/15/17   Tower, Wynelle Fanny, MD  predniSONE (DELTASONE) 10 MG tablet Take 3 pills once daily by mouth for 3 days, then 2 pills once daily for 3 days, then 1 pill once daily for 3 days and then stop 01/08/17   Tower, Wynelle Fanny, MD  SYMBICORT 80-4.5 MCG/ACT inhaler INHALE 2 PUFFS INTO THE LUNGS 2 (TWO) TIMES DAILY. 06/03/16   Deneise Lever, MD  Topiramate ER (QUDEXY XR) 100 MG CS24 sprinkle capsule Take 100 mg by mouth at bedtime. 05/07/17   Melvenia Beam, MD    Allergies Ciprofloxacin; Levofloxacin; Quinolones; and Varenicline tartrate    Social History Social History   Tobacco Use  . Smoking status: Current Every Day Smoker    Packs/day: 1.00    Years: 20.00    Pack years: 20.00    Types: Cigarettes  . Smokeless tobacco: Never Used  Substance Use Topics  . Alcohol use: Yes    Alcohol/week: 0.0 oz    Comment: rare  . Drug use: No    Review of Systems Patient denies headaches, rhinorrhea, blurry vision, numbness, shortness of breath, chest pain, edema, cough, abdominal pain, nausea, vomiting, diarrhea, dysuria, fevers, rashes or hallucinations unless otherwise stated above in HPI. ____________________________________________   PHYSICAL EXAM:  VITAL SIGNS: Vitals:   09/14/17 1433  BP: (!) 150/88  Pulse: 77  Resp: 18  Temp: 98.7 F (37.1 C)  SpO2: 100%    Constitutional: Alert and oriented.  Eyes: Conjunctivae are normal.  Head: Atraumatic. Nose: No congestion/rhinnorhea. Mouth/Throat: Right greater than left tonsillar edema with exudate however the uvula is midline.  No evidence of  significant mass-effect does have tender lymphadenopathy in the right anterior cervical chain no vesicles.  Mucous membranes are moist.   Neck: No stridor. Painless ROM.  Cardiovascular: Normal rate, regular rhythm. Grossly normal heart sounds.  Good peripheral circulation. Respiratory: Normal respiratory effort.  No retractions. Lungs CTAB. Gastrointestinal: Soft and nontender. No distention. No abdominal bruits. No CVA tenderness. Genitourinary:  Musculoskeletal: No lower extremity tenderness nor edema.  No joint effusions. Neurologic:  Normal speech and language. No gross focal neurologic deficits are appreciated. No facial droop Skin:  Skin is warm, dry and intact. No rash noted. Psychiatric: Mood and affect are normal. Speech and behavior are normal. ____________________________________________   LABS (all labs ordered  are listed, but only abnormal results are displayed)  No results found for this or any previous visit (from the past 24 hour(s)). ____________________________________________ ____________________________________________  [id number]   ____________________________________________   PROCEDURES  Procedure(s) performed:  Procedures    Critical Care performed: no ____________________________________________   INITIAL IMPRESSION / ASSESSMENT AND PLAN / ED COURSE  Pertinent labs & imaging results that were available during my care of the patient were reviewed by me and considered in my medical decision making (see chart for details).   DDX: Pharyngitis, PTA, RPA, epiglottitis, mono  AIRYANNA DIPALMA is a 47 y.o. who presents to the ED with symptoms as described above.  Patient protecting her airway no signs of respiratory distress.  Does have significant erythema and tonsillar exudate concerning for pharyngitis.  Will provide IV fluids as the patient has report had decreased oral intake.  Will provide IV antibiotics as well as IV pain medication and reassess.   Does not seem clinically consistent with RPA, epiglottitis or PTA at this time.  We will also sent for mono.  Clinical Course as of Sep 14 1720  Tue Sep 14, 2017  1604 Was reassessed.  She has no trismus no uvular deviation right tonsil pillar is only slightly greater than the left.  At this point I do not believe this is clinically consistent with peritonsillar abscess.  Certainly possible it is very early but I do believe that the risks associated with needle aspiration in the oropharynx outweighs the benefits given this early presentation.  This seems more clinically consistent with pharyngitis for which she is receiving appropriate IV and oral antibiotics and pain medication for.  Discussed options including CT imaging at this time, I&D as well as further observation to evaluate for clinical resolution.  Patient feels comfortable and agrees with plan for 24-hour trial of antibiotic therapy   [PR]  1720 Patient reassessed.  Pain improved.  She is tolerating oral hydration and tolerated oral medications.  At this point do believe she stable and appropriate for trial of outpatient management.  Have discussed with the patient and available family all diagnostics and treatments performed thus far and all questions were answered to the best of my ability. The patient demonstrates understanding and agreement with plan.    [PR]    Clinical Course User Index [PR] Merlyn Lot, MD     As part of my medical decision making, I reviewed the following data within the [facility] notes reviewed and incorporated, Labs reviewed, notes from prior ED visits and [place] Controlled Substance Database   ____________________________________________   FINAL CLINICAL IMPRESSION(S) / ED DIAGNOSES  Final diagnoses:  Pharyngitis, unspecified etiology      NEW MEDICATIONS STARTED DURING THIS VISIT:  New Prescriptions   No medications on file     Note:  This document was prepared  using Dragon voice recognition software and may include unintentional dictation errors.    Merlyn Lot, MD 09/14/17 1723

## 2017-09-14 NOTE — ED Notes (Signed)
AAOx3.  Skin warm and dry.  Voice clear and strong.  Tolerating PO fluids well.

## 2017-09-14 NOTE — ED Triage Notes (Signed)
Pt arrived with complaints of throat pain that started Sunday. Pt sent in pictures to [facility]edoc who instructed pt to be seen for a potential abscess. Pt states it is painful to swallow and has progressively become more severe since Sunday.  Swollen pocket to the right tonsil.

## 2017-09-14 NOTE — ED Notes (Signed)
Spoke with MD Corky Downs, no new orders at this time

## 2017-09-14 NOTE — ED Notes (Signed)
FIRST NURSE NOTE:  Pt arrived via POV with reports of an abscess on her tonsils.

## 2017-10-26 ENCOUNTER — Other Ambulatory Visit: Payer: Self-pay | Admitting: *Deleted

## 2017-10-26 MED ORDER — CYANOCOBALAMIN 1000 MCG/ML IJ SOLN: INTRAMUSCULAR | 0 refills | Status: DC

## 2018-01-24 ENCOUNTER — Other Ambulatory Visit: Payer: Self-pay | Admitting: *Deleted

## 2018-01-24 NOTE — Telephone Encounter (Signed)
Last office visit 12/21/2016 for HTN.  Last refilled 12/21/2016 for #90 with 3 refills. No future appointments.  Refill?

## 2018-01-24 NOTE — Telephone Encounter (Signed)
Please schedule f/u and refill until then  

## 2018-01-25 ENCOUNTER — Telehealth: Payer: Self-pay | Admitting: Family Medicine

## 2018-01-25 MED ORDER — METOPROLOL SUCCINATE ER 25 MG PO TB24: 25.0000 mg | ORAL_TABLET | Freq: Every day | ORAL | 0 refills | Status: DC

## 2018-01-25 NOTE — Telephone Encounter (Signed)
Med filled once and Morey Hummingbird will reach out to pt to get appt scheduled

## 2018-01-25 NOTE — Telephone Encounter (Signed)
I left a message on patient's voice mail to return my call.  Dr.Tower just filled her medication and would like for patient to schedule a follow up appointment.  If patient returns my call, please schedule f/u appointment.

## 2018-01-31 NOTE — Telephone Encounter (Signed)
I spoke to patient and she scheduled appointment on 02/18/18.

## 2018-02-11 ENCOUNTER — Other Ambulatory Visit: Payer: Self-pay | Admitting: Neurology

## 2018-02-18 ENCOUNTER — Ambulatory Visit (INDEPENDENT_AMBULATORY_CARE_PROVIDER_SITE_OTHER): Payer: 59 | Admitting: Family Medicine

## 2018-02-18 ENCOUNTER — Encounter: Payer: Self-pay | Admitting: Family Medicine

## 2018-02-18 VITALS — BP 135/82 | HR 88 | Temp 98.6°F | Ht 64.0 in | Wt 174.2 lb

## 2018-02-18 DIAGNOSIS — I10 Essential (primary) hypertension: Secondary | ICD-10-CM | POA: Diagnosis not present

## 2018-02-18 DIAGNOSIS — R7303 Prediabetes: Secondary | ICD-10-CM

## 2018-02-18 DIAGNOSIS — E7800 Pure hypercholesterolemia, unspecified: Secondary | ICD-10-CM | POA: Diagnosis not present

## 2018-02-18 DIAGNOSIS — E538 Deficiency of other specified B group vitamins: Secondary | ICD-10-CM

## 2018-02-18 DIAGNOSIS — Z1231 Encounter for screening mammogram for malignant neoplasm of breast: Secondary | ICD-10-CM | POA: Insufficient documentation

## 2018-02-18 DIAGNOSIS — F17200 Nicotine dependence, unspecified, uncomplicated: Secondary | ICD-10-CM | POA: Diagnosis not present

## 2018-02-18 DIAGNOSIS — Z23 Encounter for immunization: Secondary | ICD-10-CM

## 2018-02-18 LAB — CBC WITH DIFFERENTIAL/PLATELET
Basophils Absolute: 0 10*3/uL (ref 0.0–0.1)
Basophils Relative: 0.7 % (ref 0.0–3.0)
Eosinophils Absolute: 0.2 10*3/uL (ref 0.0–0.7)
Eosinophils Relative: 2.9 % (ref 0.0–5.0)
HCT: 43.2 % (ref 36.0–46.0)
Hemoglobin: 14.4 g/dL (ref 12.0–15.0)
Lymphocytes Relative: 35.3 % (ref 12.0–46.0)
Lymphs Abs: 1.9 10*3/uL (ref 0.7–4.0)
MCHC: 33.3 g/dL (ref 30.0–36.0)
MCV: 85.5 fl (ref 78.0–100.0)
Monocytes Absolute: 0.4 10*3/uL (ref 0.1–1.0)
Monocytes Relative: 8 % (ref 3.0–12.0)
Neutro Abs: 2.8 10*3/uL (ref 1.4–7.7)
Neutrophils Relative %: 53.1 % (ref 43.0–77.0)
Platelets: 177 10*3/uL (ref 150.0–400.0)
RBC: 5.05 Mil/uL (ref 3.87–5.11)
RDW: 14.4 % (ref 11.5–15.5)
WBC: 5.3 10*3/uL (ref 4.0–10.5)

## 2018-02-18 LAB — COMPREHENSIVE METABOLIC PANEL
ALT: 15 U/L (ref 0–35)
AST: 15 U/L (ref 0–37)
Albumin: 4.4 g/dL (ref 3.5–5.2)
Alkaline Phosphatase: 67 U/L (ref 39–117)
BUN: 10 mg/dL (ref 6–23)
CO2: 23 mEq/L (ref 19–32)
Calcium: 9.3 mg/dL (ref 8.4–10.5)
Chloride: 110 mEq/L (ref 96–112)
Creatinine, Ser: 0.71 mg/dL (ref 0.40–1.20)
GFR: 93.77 mL/min (ref >60.00)
Glucose, Bld: 112 mg/dL — ABNORMAL HIGH (ref 70–99)
Potassium: 3.6 mEq/L (ref 3.5–5.1)
Sodium: 142 mEq/L (ref 135–145)
Total Bilirubin: 0.3 mg/dL (ref 0.2–1.2)
Total Protein: 6.8 g/dL (ref 6.0–8.3)

## 2018-02-18 LAB — HEMOGLOBIN A1C: Hgb A1c MFr Bld: 6.1 % (ref 4.6–6.5)

## 2018-02-18 LAB — LDL CHOLESTEROL, DIRECT: Direct LDL: 159 mg/dL

## 2018-02-18 LAB — LIPID PANEL
Cholesterol: 216 mg/dL — ABNORMAL HIGH (ref 0–200)
HDL: 50.6 mg/dL (ref >39.00)
NonHDL: 165.77
Total CHOL/HDL Ratio: 4
Triglycerides: 224 mg/dL — ABNORMAL HIGH (ref 0.0–149.0)
VLDL: 44.8 mg/dL — ABNORMAL HIGH (ref 0.0–40.0)

## 2018-02-18 LAB — TSH: TSH: 0.68 u[IU]/mL (ref 0.35–4.50)

## 2018-02-18 MED ORDER — HYDROCHLOROTHIAZIDE 25 MG PO TABS: 25.0000 mg | ORAL_TABLET | Freq: Every day | ORAL | 3 refills | Status: DC

## 2018-02-18 MED ORDER — METOPROLOL SUCCINATE ER 25 MG PO TB24: 25.0000 mg | ORAL_TABLET | Freq: Every day | ORAL | 3 refills | Status: DC

## 2018-02-18 NOTE — Progress Notes (Signed)
Subjective:    Patient ID: Desiree Walters, female    DOB: 02/05/1971, 47 y.o.   MRN: [id number]  HPI Here for f/u of chronic health problems   Doing well /feeling good   Wt Readings from Last 3 Encounters:  02/18/18 174 lb 4 oz (79 kg)  09/14/17 163 lb (73.9 kg)  01/19/17 177 lb (80.3 kg)  gained 10 lb Has a new boyfriend - eats pasta all the time  They both need to eat better  She was in [place] -did well - feel  No exercise  29.91 kg/m   Smoking status-started her wellbutrin back - has cut it in 1/2  No quit date Not ready to totally quit   bp is up on first check today No cp or palpitations or headaches or edema  No side effects to medicines  BP Readings from Last 3 Encounters:  02/18/18 (!) 156/84  09/14/17 128/78  01/19/17 121/82    HCTZ 25 mg  toprol xl 25 mg  Better on 2nd check BP: 135/82     Pulse Readings from Last 3 Encounters:  02/18/18 88  09/14/17 70  01/19/17 80   Sees neurology for migraines  Can no longer afford topamax XR--will go back to short acting   Prediabetes Lab Results  Component Value Date   HGBA1C 5.9 07/22/2015   Due for labs   Hyperlipidemia Lab Results  Component Value Date   CHOL 172 12/21/2016   HDL 43 (L) 12/21/2016   LDLCALC 99 12/21/2016   LDLDIRECT 149.0 07/22/2015   TRIG 204 (H) 12/21/2016   CHOLHDL 4.0 12/21/2016   Atorvastatin and diet   B12 def Lab Results  Component Value Date   VITAMINB12 1,565 (H) 12/21/2016  no shot in 2 1/2 mo    Needs her flu shot today   Patient Active Problem List   Diagnosis Date Noted  . Screening mammogram, encounter for 02/18/2018  . Migraine without aura 01/21/2017  . Menopausal syndrome (hot flashes) 05/09/2014  . Encounter for screening mammogram for breast cancer 05/09/2014  . Other screening mammogram 04/10/2013  . Vitamin B12 deficiency 10/09/2009  . Hyperlipidemia 10/09/2009  . Prediabetes 10/09/2009  . RHINITIS 05/10/2008  . HYPERSOMNIA 09/26/2007  .  MENORRHAGIA 11/10/2006  . Intractable headache 11/10/2006  . Obstructive sleep apnea 08/20/2006  . TOBACCO ABUSE 08/04/2006  . Essential hypertension 08/04/2006  . OBSESSIVE-COMPULSIVE DISORDER 07/26/2006  . FIBROMYALGIA 07/26/2006   Past Medical History:  Diagnosis Date  . Diabetes mellitus   . Fibromyalgia   . Hyperlipemia   . Hypersomnia    NPSG 08-25-07-1.2/hr  . Obesity   . OCD (obsessive compulsive disorder)   . Pernicious anemia   . Tobacco abuse    Past Surgical History:  Procedure Laterality Date  . ABDOMINAL HYSTERECTOMY    . BREAST ENHANCEMENT SURGERY  2003  . FOOT SURGERY  2005; 2008  . IUD - Merina  2008  . NASAL SEPTUM SURGERY     Social History   Tobacco Use  . Smoking status: Current Every Day Smoker    Packs/day: 0.75    Years: 20.00    Pack years: 15.00    Types: Cigarettes  . Smokeless tobacco: Never Used  Substance Use Topics  . Alcohol use: Yes    Alcohol/week: 0.0 standard drinks    Comment: rare  . Drug use: No   Family History  Problem Relation Age of Onset  . Hypertension Father   . Coronary artery disease  Father   . Sleep apnea Father        with CPAP  . Heart disease Father        CAD  . Hyperlipidemia Father   . Hypertension Mother   . Hyperlipidemia Mother   . Hypertension Sister   . Hyperlipidemia Other        aunts/uncles  . Coronary artery disease Other        aunts/uncles  . Stroke Maternal Grandmother   . Multiple sclerosis Neg Hx    Allergies  Allergen Reactions  . Ciprofloxacin     REACTION: reaction not known  . Levofloxacin     REACTION: reaction not known  . Quinolones     Other reaction(s): Unknown  . Varenicline Tartrate     REACTION: headache   Current Outpatient Medications on File Prior to Visit  Medication Sig Dispense Refill  . albuterol (PROVENTIL HFA;VENTOLIN HFA) 108 (90 Base) MCG/ACT inhaler Inhale 2 puffs into the lungs every 6 (six) hours as needed for wheezing or shortness of breath. 1  Inhaler 12  . atorvastatin (LIPITOR) 10 MG tablet Take 1 tablet (10 mg total) by mouth daily. In the evening 90 tablet 3  . buPROPion (WELLBUTRIN SR) 100 MG 12 hr tablet Take 1 tablet (100 mg total) by mouth 2 (two) times daily. 180 tablet 3  . clonazePAM (KLONOPIN) 0.5 MG tablet Take 1 tablet by mouth at bedtime.    . cyanocobalamin (,VITAMIN B-12,) 1000 MCG/ML injection INJECT 1 ML IM ONCE A MONTH **needs to schedule appt before future refills are given 1 mL 0  . cyclobenzaprine (FLEXERIL) 10 MG tablet Take 1 tablet by mouth 3 (three) times daily as needed.    . fluconazole (DIFLUCAN) 150 MG tablet Take 1 tablet (150 mg total) by mouth daily. 1 tablet 0  . frovatriptan (FROVA) 2.5 MG tablet Take 1 tablet (2.5 mg total) as needed by mouth for migraine. If recurs, may repeat after 2 hours. Max of 3 tabs in 24 hours. 10 tablet 3  . HYDROcodone-acetaminophen (NORCO) 10-325 MG per tablet Take 1 tablet by mouth every 4 (four) hours as needed.      Marland Kitchen HYDROcodone-acetaminophen (NORCO) 5-325 MG tablet Take 1 tablet by mouth every 4 (four) hours as needed for moderate pain. 6 tablet 0  . SYMBICORT 80-4.5 MCG/ACT inhaler INHALE 2 PUFFS INTO THE LUNGS 2 (TWO) TIMES DAILY. 10.2 g 11  . topiramate ER (QUDEXY XR) CS24 sprinkle capsule Take 1 capsule (100 mg total) by mouth at bedtime. Must be seen in office for further refills. Call [phone]. 30 capsule 0   No current facility-administered medications on file prior to visit.     Review of Systems  Constitutional: Negative for activity change, appetite change, fatigue, fever and unexpected weight change.  HENT: Negative for congestion, ear pain, rhinorrhea, sinus pressure and sore throat.   Eyes: Negative for pain, redness and visual disturbance.  Respiratory: Negative for cough, shortness of breath and wheezing.   Cardiovascular: Negative for chest pain and palpitations.  Gastrointestinal: Negative for abdominal pain, blood in stool, constipation and  diarrhea.  Endocrine: Negative for polydipsia and polyuria.  Genitourinary: Negative for dysuria, frequency and urgency.  Musculoskeletal: Negative for arthralgias, back pain and myalgias.  Skin: Negative for pallor and rash.  Allergic/Immunologic: Negative for environmental allergies.  Neurological: Negative for dizziness, syncope and headaches.  Hematological: Negative for adenopathy. Does not bruise/bleed easily.  Psychiatric/Behavioral: Negative for decreased concentration and dysphoric mood. The patient is  not nervous/anxious.        Objective:   Physical Exam  Constitutional: She appears well-developed and well-nourished. No distress.  obese and well appearing   HENT:  Head: Normocephalic and atraumatic.  Mouth/Throat: Oropharynx is clear and moist.  Eyes: Pupils are equal, round, and reactive to light. Conjunctivae and EOM are normal.  Neck: Normal range of motion. Neck supple. No JVD present. Carotid bruit is not present. No thyromegaly present.  Cardiovascular: Normal rate, regular rhythm, normal heart sounds and intact distal pulses. Exam reveals no gallop.  Pulmonary/Chest: Effort normal and breath sounds normal. No respiratory distress. She has no wheezes. She has no rales.  No crackles  Abdominal: Soft. Bowel sounds are normal. She exhibits no distension, no abdominal bruit and no mass. There is no tenderness.  Musculoskeletal: She exhibits no edema or tenderness.  Lymphadenopathy:    She has no cervical adenopathy.  Neurological: She is alert. She has normal reflexes. She displays normal reflexes. No cranial nerve deficit. Coordination normal.  Skin: Skin is warm and dry. No rash noted.  Psychiatric: She has a normal mood and affect.          Assessment & Plan:   Problem List Items Addressed This Visit      Cardiovascular and Mediastinum   Essential hypertension - Primary    bp in fair control at this time  BP Readings from Last 1 Encounters:  02/18/18  135/82   No changes needed Most recent labs reviewed  Disc lifstyle change with low sodium diet and exercise  Better on 2nd check today  Lab ordered Wt loss enc      Relevant Medications   hydrochlorothiazide (HYDRODIURIL) 25 MG tablet   metoprolol succinate (TOPROL-XL) 25 MG 24 hr tablet   Other Relevant Orders   CBC with Differential/Platelet (Completed)   Comprehensive metabolic panel (Completed)   Lipid panel (Completed)   TSH (Completed)     Other   Vitamin B12 deficiency    B12 level today Has held off her monthly shot for 2.5 months       TOBACCO ABUSE    Disc in detail risks of smoking and possible outcomes including copd, vascular/ heart disease, cancer , respiratory and sinus infections  Pt voices understanding She is cutting back but not ready to quit Offered help      Screening mammogram, encounter for    Due for annual screening  Mammogram ordered      Relevant Orders   MM 3D SCREEN BREAST BILATERAL   Prediabetes    A1C today  Wt gain and worse eating-this may be up  disc imp of low glycemic diet and wt loss to prevent DM2       Relevant Orders   Hemoglobin A1c (Completed)   Hyperlipidemia    Disc goals for lipids and reasons to control them Rev last labs with pt Rev low sat fat diet in detail Labs ordered today  Continue atorvastatin and diet      Relevant Medications   hydrochlorothiazide (HYDRODIURIL) 25 MG tablet   metoprolol succinate (TOPROL-XL) 25 MG 24 hr tablet    Other Visit Diagnoses    Need for influenza vaccination       Relevant Orders   Flu Vaccine QUAD 6+ mos PF IM (Fluarix Quad PF) (Completed)

## 2018-02-18 NOTE — Patient Instructions (Signed)
Take care of yourself   Keep thinking about quitting smoking  Think about exercise and better diet  Get back to [place] [facility] today   We will schedule a mammogram at check out

## 2018-02-20 ENCOUNTER — Encounter: Payer: Self-pay | Admitting: Family Medicine

## 2018-02-20 NOTE — Assessment & Plan Note (Signed)
B12 level today Has held off her monthly shot for 2.5 months

## 2018-02-20 NOTE — Assessment & Plan Note (Signed)
A1C today  Wt gain and worse eating-this may be up  disc imp of low glycemic diet and wt loss to prevent DM2

## 2018-02-20 NOTE — Assessment & Plan Note (Signed)
Due for annual screening  Mammogram ordered

## 2018-02-20 NOTE — Assessment & Plan Note (Signed)
Disc goals for lipids and reasons to control them Rev last labs with pt Rev low sat fat diet in detail Labs ordered today  Continue atorvastatin and diet

## 2018-02-20 NOTE — Assessment & Plan Note (Signed)
bp in fair control at this time  BP Readings from Last 1 Encounters:  02/18/18 135/82   No changes needed Most recent labs reviewed  Disc lifstyle change with low sodium diet and exercise  Better on 2nd check today  Lab ordered Wt loss enc

## 2018-02-20 NOTE — Assessment & Plan Note (Signed)
Disc in detail risks of smoking and possible outcomes including copd, vascular/ heart disease, cancer , respiratory and sinus infections  Pt voices understanding She is cutting back but not ready to quit Offered help

## 2018-03-03 ENCOUNTER — Other Ambulatory Visit: Payer: Self-pay | Admitting: *Deleted

## 2018-03-03 ENCOUNTER — Other Ambulatory Visit: Payer: Self-pay | Admitting: Neurology

## 2018-03-03 NOTE — Telephone Encounter (Addendum)
Spoke with patient. Discussed f/u needed for further refills of Qudexy. Pt requested Jan 17th or 21st as she is off work then. RN scheduled pt with Janett Billow NP on 04/05/18 @ 08:45 arrival 08:15, this time for migraine f/u, no new issues. Patient also was given number to call to request a new copay card. Pt stated that the medication is expensive so she may consider switching to generic instead. RN advised pt to try to request a card and if she is unable to get a new one, let us know. Patient verbalized appreciation.

## 2018-04-05 ENCOUNTER — Ambulatory Visit: Payer: Self-pay | Admitting: Adult Health

## 2018-04-05 ENCOUNTER — Telehealth: Payer: Self-pay

## 2018-04-05 NOTE — Telephone Encounter (Signed)
Patient was a no call/no show for their appointment today.   

## 2018-04-05 NOTE — Progress Notes (Deleted)
[facility]    Provider:  Dr Jaynee Eagles Referring Provider: Abner Greenspan, MD Primary Care Physician:  Tower, Wynelle Fanny, MD  CC:  migraines  HPI:   Interval history 04/05/2018: Patient is being seen today for migraine follow-up and medication management.  MRI cervical spine was performed on 01/2017 which showed mild arthritic/degenerative changes normal for aging with spinal cord normal appearance without suspicion for MS (full report below).  MRI of the brain unremarkable without lesions suspicious for MS (full report below).  She was started on Qudexy ER 50 mg which helped to improve her headaches approximately 40% and was tolerating well therefore dosage increased to 100 mg daily.  She states her headaches have been ***    MR BRAIN W WO CONTRAST 02/07/2017 IMPRESSION:  This MRI of the brain with and without contrast shows the following: 1.    Scattered small T2/FLAIR hyperintense foci in the pons and the subcortical and deep white matter of both hemispheres. None of these appear to be acute. This is a nonspecific finding and could be due to mild chronic microvascular ischemic change or be the sequela of migraine or prior infection, inflammation or trauma.   The appearance is not typical for demyelination.   2.    There are no acute findings and there is a normal enhancement pattern.  MR CERVICAL SPINE W WO CONTRAST 02/07/2017 IMPRESSION:  This MRI of the cervical spine with and without contrast shows the following: 1.    The spinal cord appears normal before and after contrast. 2.    At C4-C5, there is a small left paramedian disc protrusion and left uncovertebral spurring. There is no significant foraminal narrowing or nerve root compression. 3.    At C5-C6, there is a left paramedian disc herniation and left uncovertebral spurring causing mild spinal stenosis. There is moderate left foraminal narrowing encroaching upon the left C6 nerve root without causing definite  nerve root compression. 4.    At C6-C7, there is a left paramedian disc protrusion and minimal left uncovertebral spurring causing mild to moderate left foraminal narrowing.  There does not appear to be nerve root compression.. 5.    There is a normal enhancement pattern.    Initial visit 01/19/2017 Dr. Jaynee Eagles:  [name] is a 48 y.o. female here as a referral from Dr. Glori Bickers for migraines. PMHx tobacco abuse, pernicious anemia, OCD, obesity, HLD, hypersomnia, Fibromyalgia, DM. She had a virus 21 days ago and now have a headache for 21 days. She was treated with augmentin, she had fever and sinus pressure, she tried prednisone, it didn't help. Most of the day she is fine, the headache feels like she has a skull cap on, pressure, if she bends over she feels like she is going to pass out with worse pain, pounding, throbbing, light sensitivity and sound sensitivity. Not her normal migraines, moving makes it worse at night, she is lightheaded and nauseated. Better in the morning, gradually gets worse over the course of the day.  It has been years since she had a headache, but it is the same possibly she used to get status migrainosus. 9 days of steroids did not help. Took a little of the edge off. She has tried gabapentin, she took Topiramate from 5-6 years ago and made her dizzy. She takes oxycodone every day for chronic pain and has been told she maybe ha MS. She took an excedrin migraine and it worked during the day but the headache returned.  No other focal neurologic deficits, associated symptoms, inciting events or modifiable factors. She has chronic low back pain.  Reviewed notes, labs and imaging from outside physicians, which showed:  MRI lumbar spine 05/2007:  IMPRESSION:  1. Shallow right foraminal disc protrusion at L4-5 could irritate the right L-4 nerve root. No significant spinal or lateral recess stenosis in the lumbar spine.   2. Benign-appearing Tarlov cyst behind S-2.   3. Incidental  note is made of an exophytic lesion off of the fundal region of the uterus. It may just be a degenerated fibroid but the signal characteristics are not typical. Dedicated MRI of the pelvis may be helpful for better evaluation of this abnormality.  Review of Systems: Patient complains of symptoms per HPI as well as the following symptoms: headache, low back pain. Pertinent negatives and positives per HPI. All others negative.   Social History   Socioeconomic History  . Marital status: Married    Spouse name: Not on file  . Number of children: Not on file  . Years of education: Not on file  . Highest education level: Not on file  Occupational History  . Occupation: med Designer, multimedia for [employer]  . Financial resource strain: Not on file  . Food insecurity:    Worry: Not on file    Inability: Not on file  . Transportation needs:    Medical: No    Non-medical: No  Tobacco Use  . Smoking status: Current Every Day Smoker    Packs/day: 0.75    Years: 20.00    Pack years: 15.00    Types: Cigarettes  . Smokeless tobacco: Never Used  Substance and Sexual Activity  . Alcohol use: Yes    Alcohol/week: 0.0 standard drinks    Comment: rare  . Drug use: No  . Sexual activity: Not on file  Lifestyle  . Physical activity:    Days per week: Not on file    Minutes per session: Not on file  . Stress: Not on file  Relationships  . Social connections:    Talks on phone: Not on file    Gets together: Not on file    Attends religious service: Not on file    Active member of club or organization: Not on file    Attends meetings of clubs or organizations: Not on file    Relationship status: Not on file  . Intimate partner violence:    Fear of current or ex partner: Not on file    Emotionally abused: Not on file    Physically abused: Not on file    Forced sexual activity: Not on file  Other Topics Concern  . Not on file  Social History Narrative   Bonnita Nasuti Aheron's daughter    Daughter is unmarried daughter; drug problem   Cares for grandchild   Right handed   7 cups of caffeine daily          Family History  Problem Relation Age of Onset  . Hypertension Father   . Coronary artery disease Father   . Sleep apnea Father        with CPAP  . Heart disease Father        CAD  . Hyperlipidemia Father   . Hypertension Mother   . Hyperlipidemia Mother   . Hypertension Sister   . Hyperlipidemia Other        aunts/uncles  . Coronary artery disease Other        aunts/uncles  .  Stroke Maternal Grandmother   . Multiple sclerosis Neg Hx     Past Medical History:  Diagnosis Date  . Diabetes mellitus   . Fibromyalgia   . Hyperlipemia   . Hypersomnia    NPSG 08-25-07-1.2/hr  . Obesity   . OCD (obsessive compulsive disorder)   . Pernicious anemia   . Tobacco abuse     Past Surgical History:  Procedure Laterality Date  . ABDOMINAL HYSTERECTOMY    . BREAST ENHANCEMENT SURGERY  2003  . FOOT SURGERY  2005; 2008  . IUD - Merina  2008  . NASAL SEPTUM SURGERY      Current Outpatient Medications  Medication Sig Dispense Refill  . albuterol (PROVENTIL HFA;VENTOLIN HFA) 108 (90 Base) MCG/ACT inhaler Inhale 2 puffs into the lungs every 6 (six) hours as needed for wheezing or shortness of breath. 1 Inhaler 12  . atorvastatin (LIPITOR) 10 MG tablet Take 1 tablet (10 mg total) by mouth daily. In the evening 90 tablet 3  . buPROPion (WELLBUTRIN SR) 100 MG 12 hr tablet Take 1 tablet (100 mg total) by mouth 2 (two) times daily. 180 tablet 3  . clonazePAM (KLONOPIN) 0.5 MG tablet Take 1 tablet by mouth at bedtime.    . cyanocobalamin (,VITAMIN B-12,) 1000 MCG/ML injection INJECT 1 ML IM ONCE A MONTH **needs to schedule appt before future refills are given 1 mL 0  . cyclobenzaprine (FLEXERIL) 10 MG tablet Take 1 tablet by mouth 3 (three) times daily as needed.    . fluconazole (DIFLUCAN) 150 MG tablet Take 1 tablet (150 mg total) by mouth daily. 1 tablet 0  .  frovatriptan (FROVA) 2.5 MG tablet Take 1 tablet (2.5 mg total) as needed by mouth for migraine. If recurs, may repeat after 2 hours. Max of 3 tabs in 24 hours. 10 tablet 3  . hydrochlorothiazide (HYDRODIURIL) 25 MG tablet Take 1 tablet (25 mg total) by mouth daily. 90 tablet 3  . HYDROcodone-acetaminophen (NORCO) 10-325 MG per tablet Take 1 tablet by mouth every 4 (four) hours as needed.      Marland Kitchen HYDROcodone-acetaminophen (NORCO) 5-325 MG tablet Take 1 tablet by mouth every 4 (four) hours as needed for moderate pain. 6 tablet 0  . metoprolol succinate (TOPROL-XL) 25 MG 24 hr tablet Take 1 tablet (25 mg total) by mouth daily. 90 tablet 3  . QUDEXY XR CS24 sprinkle capsule TAKE ONE CAPSULE BY MOUTH AT BEDTIME 90 capsule 0  . SYMBICORT 80-4.5 MCG/ACT inhaler INHALE 2 PUFFS INTO THE LUNGS 2 (TWO) TIMES DAILY. 10.2 g 11   No current facility-administered medications for this visit.     Allergies as of 04/05/2018 - Review Complete 02/18/2018  Allergen Reaction Noted  . Ciprofloxacin  07/26/2006  . Levofloxacin  07/26/2006  . Quinolones  08/19/2015  . Varenicline tartrate  08/04/2006    Vitals: There were no vitals taken for this visit. Last Weight:  Wt Readings from Last 1 Encounters:  02/18/18 174 lb 4 oz (79 kg)   Last Height:   Ht Readings from Last 1 Encounters:  02/18/18 5\' 4"  (1.626 m)    Physical exam: Exam: Gen: NAD, conversant, well nourised, obese, well groomed                     CV: RRR, no MRG. No Carotid Bruits. No peripheral edema, warm, nontender Eyes: Conjunctivae clear without exudates or hemorrhage  Neuro: Detailed Neurologic Exam  Speech:    Speech is normal; fluent and  spontaneous with normal comprehension.  Cognition:    The patient is oriented to person, place, and time;     recent and remote memory intact;     language fluent;     normal attention, concentration,     fund of knowledge Cranial Nerves:    The pupils are equal, round, and reactive to  light. The fundi are normal and spontaneous venous pulsations are present. Visual fields are full to finger confrontation. Extraocular movements are intact. Trigeminal sensation is intact and the muscles of mastication are normal. The face is symmetric. The palate elevates in the midline. Hearing intact. Voice is normal. Shoulder shrug is normal. The tongue has normal motion without fasciculations.   Coordination:    Normal finger to nose and heel to shin. Normal rapid alternating movements.   Gait:    Heel-toe and tandem gait are normal.   Motor Observation:    No asymmetry, no atrophy, and no involuntary movements noted. Tone:    Normal muscle tone.    Posture:    Posture is normal. normal erect    Strength:    Strength is V/V in the upper and lower limbs.      Sensation: intact to LT     Reflex Exam:  DTR's:    Deep tendon reflexes in the upper and lower extremities are normal bilaterally.   Toes:    The toes are downgoing bilaterally.   Clonus:    Clonus is absent.      Assessment/Plan:  Patient with acute continuous headache, intractable, positional in quality with a possible Hx of MS, paresthesias, vision changes.  MRI cervical spine and brain all appeared normal without evidence of MS.  Started on Qudexy for migraine management.      Venancio Poisson, AGNP-BC  [facility] [address] [place], [place] [address]  Phone [phone] Fax [phone] Note: This document was prepared with digital dictation and possible smart phrase technology. Any transcriptional errors that result from this process are unintentional.

## 2018-04-06 ENCOUNTER — Encounter: Payer: Self-pay | Admitting: Adult Health

## 2018-05-19 ENCOUNTER — Ambulatory Visit (INDEPENDENT_AMBULATORY_CARE_PROVIDER_SITE_OTHER): Payer: 59 | Admitting: Family Medicine

## 2018-05-19 ENCOUNTER — Encounter: Payer: Self-pay | Admitting: Family Medicine

## 2018-05-19 VITALS — BP 142/88 | HR 71 | Temp 98.2°F | Wt 179.0 lb

## 2018-05-19 DIAGNOSIS — J209 Acute bronchitis, unspecified: Secondary | ICD-10-CM | POA: Insufficient documentation

## 2018-05-19 DIAGNOSIS — F17200 Nicotine dependence, unspecified, uncomplicated: Secondary | ICD-10-CM

## 2018-05-19 MED ORDER — PREDNISONE 20 MG PO TABS: ORAL_TABLET | ORAL | 0 refills | Status: DC

## 2018-05-19 MED ORDER — BENZONATATE 200 MG PO CAPS: 200.0000 mg | ORAL_CAPSULE | Freq: Three times a day (TID) | ORAL | 1 refills | Status: DC | PRN

## 2018-05-19 NOTE — Progress Notes (Signed)
Subjective:    Patient ID: Desiree Walters, female    DOB: 1970/07/16, 48 y.o.   MRN: [id number]  HPI  Here for cough/hoarse voice   Started with a cold 3 weeks ago   Cough is not getting better  Rarely prod- clear  Wheezing and tightness   Uses her child's nebulizer Has an albuterol inhaler   Used some of her boyfriend's tessalon pearles-they help   Over the weekend a little elevated temp   Ears are a little clogged   Current smoker  Not ready to quit   Patient Active Problem List   Diagnosis Date Noted  . Acute bronchitis 05/19/2018  . Screening mammogram, encounter for 02/18/2018  . Migraine without aura 01/21/2017  . Menopausal syndrome (hot flashes) 05/09/2014  . Encounter for screening mammogram for breast cancer 05/09/2014  . Other screening mammogram 04/10/2013  . Vitamin B12 deficiency 10/09/2009  . Hyperlipidemia 10/09/2009  . Prediabetes 10/09/2009  . RHINITIS 05/10/2008  . HYPERSOMNIA 09/26/2007  . MENORRHAGIA 11/10/2006  . Intractable headache 11/10/2006  . Obstructive sleep apnea 08/20/2006  . TOBACCO ABUSE 08/04/2006  . Essential hypertension 08/04/2006  . OBSESSIVE-COMPULSIVE DISORDER 07/26/2006  . FIBROMYALGIA 07/26/2006   Past Medical History:  Diagnosis Date  . Diabetes mellitus   . Fibromyalgia   . Hyperlipemia   . Hypersomnia    NPSG 08-25-07-1.2/hr  . Obesity   . OCD (obsessive compulsive disorder)   . Pernicious anemia   . Tobacco abuse    Past Surgical History:  Procedure Laterality Date  . ABDOMINAL HYSTERECTOMY    . BREAST ENHANCEMENT SURGERY  2003  . FOOT SURGERY  2005; 2008  . IUD - Merina  2008  . NASAL SEPTUM SURGERY     Social History   Tobacco Use  . Smoking status: Current Every Day Smoker    Packs/day: 0.75    Years: 20.00    Pack years: 15.00    Types: Cigarettes  . Smokeless tobacco: Never Used  Substance Use Topics  . Alcohol use: Yes    Alcohol/week: 0.0 standard drinks    Comment: rare  . Drug  use: No   Family History  Problem Relation Age of Onset  . Hypertension Father   . Coronary artery disease Father   . Sleep apnea Father        with CPAP  . Heart disease Father        CAD  . Hyperlipidemia Father   . Hypertension Mother   . Hyperlipidemia Mother   . Hypertension Sister   . Hyperlipidemia Other        aunts/uncles  . Coronary artery disease Other        aunts/uncles  . Stroke Maternal Grandmother   . Multiple sclerosis Neg Hx    Allergies  Allergen Reactions  . Ciprofloxacin     REACTION: reaction not known  . Levofloxacin     REACTION: reaction not known  . Quinolones     Other reaction(s): Unknown  . Varenicline Tartrate     REACTION: headache   Current Outpatient Medications on File Prior to Visit  Medication Sig Dispense Refill  . albuterol (PROVENTIL HFA;VENTOLIN HFA) 108 (90 Base) MCG/ACT inhaler Inhale 2 puffs into the lungs every 6 (six) hours as needed for wheezing or shortness of breath. 1 Inhaler 12  . atorvastatin (LIPITOR) 10 MG tablet Take 1 tablet (10 mg total) by mouth daily. In the evening 90 tablet 3  . buPROPion (WELLBUTRIN SR) 100  MG 12 hr tablet Take 1 tablet (100 mg total) by mouth 2 (two) times daily. 180 tablet 3  . clonazePAM (KLONOPIN) 0.5 MG tablet Take 1 tablet by mouth at bedtime.    . cyclobenzaprine (FLEXERIL) 10 MG tablet Take 1 tablet by mouth 3 (three) times daily as needed.    . hydrochlorothiazide (HYDRODIURIL) 25 MG tablet Take 1 tablet (25 mg total) by mouth daily. 90 tablet 3  . HYDROcodone-acetaminophen (NORCO) 10-325 MG per tablet Take 1 tablet by mouth every 4 (four) hours as needed.      . metoprolol succinate (TOPROL-XL) 25 MG 24 hr tablet Take 1 tablet (25 mg total) by mouth daily. 90 tablet 3  . QUDEXY XR CS24 sprinkle capsule TAKE ONE CAPSULE BY MOUTH AT BEDTIME 90 capsule 0  . SYMBICORT 80-4.5 MCG/ACT inhaler INHALE 2 PUFFS INTO THE LUNGS 2 (TWO) TIMES DAILY. 10.2 g 11  . cyanocobalamin (,VITAMIN B-12,) 1000  MCG/ML injection INJECT 1 ML IM ONCE A MONTH **needs to schedule appt before future refills are given (Patient not taking: Reported on 05/19/2018) 1 mL 0   No current facility-administered medications on file prior to visit.     Review of Systems  Constitutional: Positive for appetite change and fatigue. Negative for fever.  HENT: Positive for congestion, postnasal drip, rhinorrhea, sinus pressure, sneezing and sore throat. Negative for ear pain.   Eyes: Negative for pain and discharge.  Respiratory: Positive for cough and wheezing. Negative for shortness of breath and stridor.   Cardiovascular: Negative for chest pain.  Gastrointestinal: Negative for diarrhea, nausea and vomiting.  Genitourinary: Negative for frequency, hematuria and urgency.  Musculoskeletal: Negative for arthralgias and myalgias.  Skin: Negative for rash.  Neurological: Positive for headaches. Negative for dizziness, weakness and light-headedness.  Psychiatric/Behavioral: Negative for confusion and dysphoric mood.       Objective:   Physical Exam Constitutional:      General: She is not in acute distress.    Appearance: Normal appearance. She is well-developed. She is obese. She is not ill-appearing, toxic-appearing or diaphoretic.  HENT:     Head: Normocephalic and atraumatic.     Comments: Nares are injected and congested      Right Ear: Tympanic membrane, ear canal and external ear normal.     Left Ear: Tympanic membrane, ear canal and external ear normal.     Nose: Congestion and rhinorrhea present.     Mouth/Throat:     Mouth: Mucous membranes are moist.     Pharynx: Oropharynx is clear. No oropharyngeal exudate or posterior oropharyngeal erythema.     Comments: Clear pnd  Eyes:     General:        Right eye: No discharge.        Left eye: No discharge.     Conjunctiva/sclera: Conjunctivae normal.     Pupils: Pupils are equal, round, and reactive to light.  Neck:     Musculoskeletal: Normal range of  motion and neck supple.  Cardiovascular:     Rate and Rhythm: Normal rate.     Heart sounds: Normal heart sounds.  Pulmonary:     Effort: Pulmonary effort is normal. No respiratory distress.     Breath sounds: Normal breath sounds. No stridor. No wheezing, rhonchi or rales.     Comments: Diffusely distant bs No rales or rhonchi  Scattered wheezes with slt prolonged exp phase   Chest:     Chest wall: No tenderness.  Lymphadenopathy:  Cervical: No cervical adenopathy.  Skin:    General: Skin is warm and dry.     Capillary Refill: Capillary refill takes less than 2 seconds.     Findings: No rash.  Neurological:     Mental Status: She is alert.     Cranial Nerves: No cranial nerve deficit.  Psychiatric:        Mood and Affect: Mood normal.           Assessment & Plan:   Problem List Items Addressed This Visit      Respiratory   Acute bronchitis - Primary    With post viral cough in a smoker Some reactive airways  Prednisone px Disc symptomatic care - see instructions on AVS  AVS: Drink fluids Rest when you can   Prednisone for reactive airways Inhaler when needed   Tessalon for cough   Also - mucinex DM as needed   Update if not starting to improve in a week or if worsening    Keep thinking about quitting smoking         Other   TOBACCO ABUSE    Disc in detail risks of smoking and possible outcomes including copd, vascular/ heart disease, cancer , respiratory and sinus infections  Pt voices understanding  She is not ready to quit

## 2018-05-19 NOTE — Patient Instructions (Signed)
Drink fluids Rest when you can   Prednisone for reactive airways Inhaler when needed   Tessalon for cough   Also - mucinex DM as needed   Update if not starting to improve in a week or if worsening    Keep thinking about quitting smoking

## 2018-05-22 ENCOUNTER — Other Ambulatory Visit: Payer: Self-pay | Admitting: Neurology

## 2018-05-22 ENCOUNTER — Other Ambulatory Visit: Payer: Self-pay | Admitting: Family Medicine

## 2018-05-22 NOTE — Assessment & Plan Note (Signed)
With post viral cough in a smoker Some reactive airways  Prednisone px Disc symptomatic care - see instructions on AVS  AVS: Drink fluids Rest when you can   Prednisone for reactive airways Inhaler when needed   Tessalon for cough   Also - mucinex DM as needed   Update if not starting to improve in a week or if worsening    Keep thinking about quitting smoking

## 2018-05-22 NOTE — Assessment & Plan Note (Signed)
Disc in detail risks of smoking and possible outcomes including copd, vascular/ heart disease, cancer , respiratory and sinus infections  Pt voices understanding She is not ready to quit  

## 2018-05-23 NOTE — Telephone Encounter (Signed)
Pt no showed f/u appt Jan 2020. She was last seen Nov 2018. Must be seen in office for f/u.

## 2018-06-23 ENCOUNTER — Other Ambulatory Visit: Payer: Self-pay | Admitting: Neurology

## 2018-06-24 ENCOUNTER — Other Ambulatory Visit: Payer: Self-pay | Admitting: Neurology

## 2018-06-27 ENCOUNTER — Telehealth: Payer: Self-pay | Admitting: *Deleted

## 2018-06-27 ENCOUNTER — Encounter: Payer: Self-pay | Admitting: *Deleted

## 2018-06-27 MED ORDER — TOPIRAMATE ER 100 MG PO SPRINKLE CAP24
EXTENDED_RELEASE_CAPSULE | ORAL | 0 refills | Status: DC
Start: 2018-06-27 — End: 2018-06-28

## 2018-06-27 NOTE — Telephone Encounter (Signed)
Spoke with pt and discussed that we had received another refill request for her Qudexy XR 100 mg and can do a temp refill however she will need an appt. Discussed that d/t the covid 19 pandemic, we are offering televisits. Discussed we can do a webex virtual visit or telephone visit, suggest virtual visit. Pt consented to a webex video visit and to have visit filed with her insurance like an office visit. Confirmed her email address, [email or web address]. Pt agreed to see Amy NP. She wants to discuss botox. Scheduled pt for tomorrow 4/14 @ 2:30 pm. Discussed that pt will need to download the [employer] webex appt to her phone and email will be sent to join the meeting. I updated pt's chart (reviewed meds, allergies, pharmacy, etc). Pt understands that she will receive a call to check on her 30 minutes prior to her appt. Pt verbalized appreciation for the call.   90 day refill of Qudexy sent to her pharmacy and webex meeting created/email sent.

## 2018-06-28 ENCOUNTER — Encounter: Payer: Self-pay | Admitting: Family Medicine

## 2018-06-28 ENCOUNTER — Ambulatory Visit (INDEPENDENT_AMBULATORY_CARE_PROVIDER_SITE_OTHER): Payer: 59 | Admitting: Family Medicine

## 2018-06-28 ENCOUNTER — Other Ambulatory Visit: Payer: Self-pay

## 2018-06-28 DIAGNOSIS — G43001 Migraine without aura, not intractable, with status migrainosus: Secondary | ICD-10-CM | POA: Diagnosis not present

## 2018-06-28 MED ORDER — TOPIRAMATE ER 100 MG PO SPRINKLE CAP24: EXTENDED_RELEASE_CAPSULE | ORAL | 3 refills | Status: DC

## 2018-06-28 NOTE — Progress Notes (Signed)
Made any corrections needed, and agree with history, physical, neuro exam,assessment and plan as stated.     Duy Lemming, MD [facility]  

## 2018-06-28 NOTE — Progress Notes (Signed)
PATIENT: Desiree Walters DOB: Mar 29, 1970  REASON FOR VISIT: follow up HISTORY FROM: patient  Virtual Visit via Telephone Note  I connected with Desiree Walters on 06/28/18 at  2:30 PM EDT by telephone and verified that I am speaking with the correct person using two identifiers.   I discussed the limitations, risks, security and privacy concerns of performing an evaluation and management service by telephone and the availability of in person appointments. I also discussed with the patient that there may be a patient responsible charge related to this service. The patient expressed understanding and agreed to proceed.   History of Present Illness:  06/28/18 Desiree Walters is a 48 y.o. female for follow up for migraines. She continues Qudexy 100mg  XR at night.  She reports that her migraines have nearly resolved.  She cannot remember the last time she had a migraine.  She is not using any prescription abortive therapy.  Occasionally she will take Excedrin but cannot remember last time she is needed to do so.  She is doing very well on current therapy.   History (copied from Dr Cathren Laine note on 01/19/2017)  HPI:  Desiree Walters is a 48 y.o. female here as a referral from Dr. Glori Bickers for migraines. PMHx tobacco abuse, pernicious anemia, OCD, obesity, HLD, hypersomnia, Fibromyalgia, DM. She had a virus 21 days ago and now have a headache for 21 days. She was treated with augmentin, she had fever and sinus pressure, she tried prednisone, it didn't help. Most of the day she is fine, the headache feels like she has a skull cap on, pressure, if she bends over she feels like she is going to pass out with worse pain, pounding, throbbing, light sensitivity and sound sensitivity. Not her normal migraines, moving makes it worse at night, she is lightheaded and nauseated. Better in the morning, gradually gets worse over the course of the day.  It has been years since she had a headache, but it is the  same possibly she used to get status migrainosus. 9 days of steroids did not help. Took a little of the edge off. She has tried gabapentin, she took Topiramate from 5-6 years ago and made her dizzy. She takes oxycodone every day for chronic pain and has been told she maybe ha MS. She took an excedrin migraine and it worked during the day but the headache returned. No other focal neurologic deficits, associated symptoms, inciting events or modifiable factors. She has chronic low back pain.  Reviewed notes, labs and imaging from outside physicians, which showed:  MRI lumbar spine 05/2007:  IMPRESSION:  1. Shallow right foraminal disc protrusion at L4-5 could irritate the right L-4 nerve root. No significant spinal or lateral recess stenosis in the lumbar spine.  2. Benign-appearing Tarlov cyst behind S-2.  3. Incidental note is made of an exophytic lesion off of the fundal region of the uterus. It may just be a degenerated fibroid but the signal characteristics are not typical. Dedicated MRI of the pelvis may be helpful for better evaluation of this abnormality.   Observations/Objective:  Generalized: Well developed, in no acute distress  Mentation: Alert oriented to time, place, history taking. Follows all commands speech and language fluent   Assessment and Plan:  48 y.o. year old female  has a past medical history of Diabetes mellitus, Fibromyalgia, Hyperlipemia, Hypersomnia, Obesity, OCD (obsessive compulsive disorder), Pernicious anemia, and Tobacco abuse. with    ICD-10-CM   1. Migraine without aura and  with status migrainosus, not intractable G43.001    Mrs. Kuck is doing very well on Qudexy 100 mg daily.  We will continue medication as prescribed.  She was advised to call should migraines worsen or become more frequent.  We will follow-up with her in 1 year.  She verbalizes understanding and agreement with the plan.  No orders of the defined types were placed in this  encounter.   Meds ordered this encounter  Medications  . topiramate ER (QUDEXY XR) 100 MG CS24 sprinkle capsule    Sig: TAKE 1 CAPSULE BY MOUTH AT BEDTIME.    Dispense:  90 capsule    Refill:  3    Order Specific Question:   Supervising Provider    Answer:   Melvenia Beam [id number]     Follow Up Instructions:  I discussed the assessment and treatment plan with the patient. The patient was provided an opportunity to ask questions and all were answered. The patient agreed with the plan and demonstrated an understanding of the instructions.   The patient was advised to call back or seek an in-person evaluation if the symptoms worsen or if the condition fails to improve as anticipated.  I provided 20 minutes of non-face-to-face time during this encounter.  Patient is in her car seat during video conference.  Provider is located at her place of residence.  Hendricks Milo, RN help to facilitate visit.   Debbora Presto, NP

## 2018-07-27 ENCOUNTER — Telehealth: Payer: Self-pay | Admitting: Family Medicine

## 2018-07-27 NOTE — Telephone Encounter (Signed)
Lm on vm for ptReturn in about 1 year (around 06/28/2019). w Debbora Presto, NP

## 2018-11-08 ENCOUNTER — Other Ambulatory Visit: Payer: Self-pay | Admitting: Family Medicine

## 2019-02-21 ENCOUNTER — Telehealth: Payer: Self-pay | Admitting: Family Medicine

## 2019-02-21 DIAGNOSIS — I10 Essential (primary) hypertension: Secondary | ICD-10-CM

## 2019-02-21 DIAGNOSIS — E538 Deficiency of other specified B group vitamins: Secondary | ICD-10-CM

## 2019-02-21 DIAGNOSIS — R7303 Prediabetes: Secondary | ICD-10-CM

## 2019-02-21 DIAGNOSIS — E7800 Pure hypercholesterolemia, unspecified: Secondary | ICD-10-CM

## 2019-02-21 NOTE — Telephone Encounter (Signed)
-----   Message from Ellamae Sia sent at 02/20/2019 11:49 AM EST ----- Regarding: lab orders for Monday, 12.14.20 Patient is scheduled for CPX labs, please order future labs, Thanks , Karna Christmas

## 2019-02-27 ENCOUNTER — Other Ambulatory Visit: Payer: 59

## 2019-02-27 ENCOUNTER — Other Ambulatory Visit: Payer: Self-pay

## 2019-03-03 ENCOUNTER — Encounter: Payer: Self-pay | Admitting: Family Medicine

## 2019-03-03 ENCOUNTER — Other Ambulatory Visit: Payer: Self-pay

## 2019-03-03 ENCOUNTER — Ambulatory Visit (INDEPENDENT_AMBULATORY_CARE_PROVIDER_SITE_OTHER): Payer: 59 | Admitting: Family Medicine

## 2019-03-03 VITALS — BP 130/84 | HR 82 | Temp 98.0°F | Ht 64.0 in | Wt 174.0 lb

## 2019-03-03 DIAGNOSIS — E7800 Pure hypercholesterolemia, unspecified: Secondary | ICD-10-CM | POA: Diagnosis not present

## 2019-03-03 DIAGNOSIS — E538 Deficiency of other specified B group vitamins: Secondary | ICD-10-CM

## 2019-03-03 DIAGNOSIS — E559 Vitamin D deficiency, unspecified: Secondary | ICD-10-CM | POA: Insufficient documentation

## 2019-03-03 DIAGNOSIS — R7303 Prediabetes: Secondary | ICD-10-CM

## 2019-03-03 DIAGNOSIS — Z1231 Encounter for screening mammogram for malignant neoplasm of breast: Secondary | ICD-10-CM

## 2019-03-03 DIAGNOSIS — I10 Essential (primary) hypertension: Secondary | ICD-10-CM

## 2019-03-03 DIAGNOSIS — F341 Dysthymic disorder: Secondary | ICD-10-CM | POA: Insufficient documentation

## 2019-03-03 DIAGNOSIS — G43001 Migraine without aura, not intractable, with status migrainosus: Secondary | ICD-10-CM

## 2019-03-03 DIAGNOSIS — R7989 Other specified abnormal findings of blood chemistry: Secondary | ICD-10-CM

## 2019-03-03 DIAGNOSIS — Z0000 Encounter for general adult medical examination without abnormal findings: Secondary | ICD-10-CM | POA: Diagnosis not present

## 2019-03-03 DIAGNOSIS — F17200 Nicotine dependence, unspecified, uncomplicated: Secondary | ICD-10-CM

## 2019-03-03 MED ORDER — BUPROPION HCL ER (XL) 150 MG PO TB24: 150.0000 mg | ORAL_TABLET | Freq: Every day | ORAL | 3 refills | Status: DC

## 2019-03-03 MED ORDER — METOPROLOL SUCCINATE ER 25 MG PO TB24: 25.0000 mg | ORAL_TABLET | Freq: Every day | ORAL | 3 refills | Status: DC

## 2019-03-03 MED ORDER — HYDROCHLOROTHIAZIDE 25 MG PO TABS: 25.0000 mg | ORAL_TABLET | Freq: Every day | ORAL | 3 refills | Status: DC

## 2019-03-03 NOTE — Patient Instructions (Addendum)
I need to see your labs We may want to change to crestor for cholesterol   Take 2000 iu of vit D daily (D3 over the counter)   Please bring a copy to your labs so I can review   Try to get on track with healthy diet and exercise

## 2019-03-03 NOTE — Progress Notes (Signed)
Subjective:    Patient ID: Desiree Walters, female    DOB: Sep 13, 1970, 48 y.o.   MRN: [id number]   This visit occurred during the SARS-CoV-2 public health emergency.  Safety protocols were in place, including screening questions prior to the visit, additional usage of staff PPE, and extensive cleaning of exam room while observing appropriate contact time as indicated for disinfecting solutions.   HPI Here for health maintenance exam and to review chronic medical problems    Feeling good overall  Wrestling with mood issues - possibly due to pandemic and menopause   Depression -wants to get back on wellbutrin  Takes klonopin in pm for anxiety and sleep   Wt Readings from Last 3 Encounters:  03/03/19 174 lb (78.9 kg)  06/27/18 172 lb (78 kg)  05/19/18 179 lb (81.2 kg)  no time for diet/exercise and self care  Knows she needs to do better  29.87 kg/m   Had a metacarpal fracture in May (5th)    Smoking status -1ppd - has decreased  Not ready to quit yet    Has OSA -mild - has cpap/ needs to follow up  Needs to change her face mask  Feels better with it   Flu vaccine - at work 11/20   Tdap 10/18   Breast cancer screening  Has implants  Mammogram -needs to schedule it at [facility] [facility] breast exam - no lumps or changes   Hysterectomy in the past - no problems  Does not go to gyn   Hypertension  bp is stable today  No cp or palpitations or headaches or edema  No side effects to medicines  BP Readings from Last 3 Encounters:  03/03/19 130/84  05/19/18 (!) 142/88  02/18/18 135/82      H/o B12 def Lab Results  Component Value Date   VITAMINB12 1,565 (H) 12/21/2016   Migraine On long acting topamax and metoprolol xl  Sees neurology   Due for labs for cholesterol Lab Results  Component Value Date   CHOL 216 (H) 02/18/2018   HDL 50.60 02/18/2018   LDLCALC 99 12/21/2016   LDLDIRECT 159.0 02/18/2018   TRIG 224.0 (H) 02/18/2018   CHOLHDL 4 02/18/2018    Taking atorvastatin 10 mg  Co enz Q 10 helping with muscle symptoms  Main side effect is muscle pain  Labs at work- she will bring   Prediabetes Lab Results  Component Value Date   HGBA1C 6.1 02/18/2018   Labs at work   5.7   Needs px for B12 - will need px for [facility] draw  Has not had a shot in 1 1/2 mo   She had low D   Patient Active Problem List   Diagnosis Date Noted  . Dysthymia 03/03/2019  . Low vitamin D level 03/03/2019  . Screening mammogram, encounter for 02/18/2018  . Migraine without aura 01/21/2017  . Menopausal syndrome (hot flashes) 05/09/2014  . Encounter for screening mammogram for breast cancer 05/09/2014  . Other screening mammogram 04/10/2013  . Vitamin B12 deficiency 10/09/2009  . Hyperlipidemia 10/09/2009  . Prediabetes 10/09/2009  . RHINITIS 05/10/2008  . HYPERSOMNIA 09/26/2007  . MENORRHAGIA 11/10/2006  . Intractable headache 11/10/2006  . Obstructive sleep apnea 08/20/2006  . TOBACCO ABUSE 08/04/2006  . Essential hypertension 08/04/2006  . OBSESSIVE-COMPULSIVE DISORDER 07/26/2006  . FIBROMYALGIA 07/26/2006   Past Medical History:  Diagnosis Date  . Diabetes mellitus   . Fibromyalgia   . Hyperlipemia   . Hypersomnia  NPSG 08-25-07-1.2/hr  . Obesity   . OCD (obsessive compulsive disorder)   . Pernicious anemia   . Tobacco abuse    Past Surgical History:  Procedure Laterality Date  . ABDOMINAL HYSTERECTOMY    . BREAST ENHANCEMENT SURGERY  2003  . FOOT SURGERY  2005; 2008  . IUD - Merina  2008  . NASAL SEPTUM SURGERY     Social History   Tobacco Use  . Smoking status: Current Every Day Smoker    Packs/day: 1.00    Years: 20.00    Pack years: 20.00    Types: Cigarettes  . Smokeless tobacco: Never Used  Substance Use Topics  . Alcohol use: Yes    Alcohol/week: 3.0 standard drinks    Types: 3 Standard drinks or equivalent per week  . Drug use: No   Family History  Problem Relation Age of Onset  . Hypertension Father    . Coronary artery disease Father   . Sleep apnea Father        with CPAP  . Heart disease Father        CAD  . Hyperlipidemia Father   . Hypertension Mother   . Hyperlipidemia Mother   . Hypertension Sister   . Hyperlipidemia Other        aunts/uncles  . Coronary artery disease Other        aunts/uncles  . Stroke Maternal Grandmother   . Multiple sclerosis Neg Hx    Allergies  Allergen Reactions  . Ciprofloxacin     REACTION: reaction not known  . Levofloxacin     REACTION: reaction not known  . Quinolones     Other reaction(s): Unknown  . Varenicline Tartrate     REACTION: headache   Current Outpatient Medications on File Prior to Visit  Medication Sig Dispense Refill  . atorvastatin (LIPITOR) 10 MG tablet Take 1 tablet (10 mg total) by mouth daily. In the evening 90 tablet 3  . clonazePAM (KLONOPIN) 0.5 MG tablet Take 1 tablet by mouth at bedtime.    . cyanocobalamin (,VITAMIN B-12,) 1000 MCG/ML injection INJECT 1 ML INTO THE MUSCLE ONCE A MONTH 3 mL 1  . cyclobenzaprine (FLEXERIL) 10 MG tablet Take 1 tablet by mouth 3 (three) times daily as needed.    Marland Kitchen HYDROcodone-acetaminophen (NORCO) 10-325 MG per tablet Take 1 tablet by mouth every 4 (four) hours as needed.      . topiramate ER (QUDEXY XR) 100 MG CS24 sprinkle capsule TAKE 1 CAPSULE BY MOUTH AT BEDTIME. 90 capsule 3   No current facility-administered medications on file prior to visit.    Review of Systems  Constitutional: Negative for activity change, appetite change, fatigue, fever and unexpected weight change.  HENT: Negative for congestion, ear pain, rhinorrhea, sinus pressure and sore throat.   Eyes: Negative for pain, redness and visual disturbance.  Respiratory: Negative for cough, shortness of breath and wheezing.   Cardiovascular: Negative for chest pain and palpitations.  Gastrointestinal: Negative for abdominal pain, blood in stool, constipation and diarrhea.  Endocrine: Negative for polydipsia and  polyuria.  Genitourinary: Negative for dysuria, frequency and urgency.  Musculoskeletal: Negative for arthralgias, back pain and myalgias.  Skin: Negative for pallor and rash.  Allergic/Immunologic: Negative for environmental allergies.  Neurological: Negative for dizziness, syncope and headaches.  Hematological: Negative for adenopathy. Does not bruise/bleed easily.  Psychiatric/Behavioral: Negative for decreased concentration and dysphoric mood. The patient is not nervous/anxious.        Stressors  Objective:   Physical Exam Constitutional:      General: She is not in acute distress.    Appearance: Normal appearance. She is well-developed and normal weight. She is not ill-appearing or diaphoretic.  HENT:     Head: Normocephalic and atraumatic.     Right Ear: Tympanic membrane, ear canal and external ear normal.     Left Ear: Tympanic membrane, ear canal and external ear normal.     Nose: Nose normal. No congestion.     Mouth/Throat:     Mouth: Mucous membranes are moist.     Pharynx: Oropharynx is clear. No posterior oropharyngeal erythema.  Eyes:     General: No scleral icterus.    Extraocular Movements: Extraocular movements intact.     Conjunctiva/sclera: Conjunctivae normal.     Pupils: Pupils are equal, round, and reactive to light.  Neck:     Thyroid: No thyromegaly.     Vascular: No carotid bruit or JVD.  Cardiovascular:     Rate and Rhythm: Normal rate and regular rhythm.     Pulses: Normal pulses.     Heart sounds: Normal heart sounds. No gallop.   Pulmonary:     Effort: Pulmonary effort is normal. No respiratory distress.     Breath sounds: Normal breath sounds. No wheezing.     Comments: Good air exch Chest:     Chest wall: No tenderness.  Abdominal:     General: Bowel sounds are normal. There is no distension or abdominal bruit.     Palpations: Abdomen is soft. There is no mass.     Tenderness: There is no abdominal tenderness.     Hernia: No hernia  is present.  Genitourinary:    Comments: Breast exam: No mass, nodules, thickening, tenderness, bulging, retraction, inflamation, nipple discharge or skin changes noted.  No axillary or clavicular LA.    (breast implants noted)  Musculoskeletal:        General: No tenderness. Normal range of motion.     Cervical back: Normal range of motion and neck supple. No rigidity. No muscular tenderness.     Right lower leg: No edema.     Left lower leg: No edema.  Lymphadenopathy:     Cervical: No cervical adenopathy.  Skin:    General: Skin is warm and dry.     Coloration: Skin is not pale.     Findings: No erythema or rash.  Neurological:     Mental Status: She is alert. Mental status is at baseline.     Cranial Nerves: No cranial nerve deficit.     Motor: No abnormal muscle tone.     Coordination: Coordination normal.     Gait: Gait normal.     Deep Tendon Reflexes: Reflexes are normal and symmetric. Reflexes normal.  Psychiatric:        Mood and Affect: Mood normal.        Cognition and Memory: Cognition and memory normal.           Assessment & Plan:   Problem List Items Addressed This Visit      Cardiovascular and Mediastinum   Essential hypertension    bp in fair control at this time  BP Readings from Last 1 Encounters:  03/03/19 130/84   No changes needed Most recent labs reviewed  Disc lifstyle change with low sodium diet and exercise        Relevant Medications   metoprolol succinate (TOPROL-XL) 25 MG 24 hr tablet  hydrochlorothiazide (HYDRODIURIL) 25 MG tablet   Migraine without aura    Clinically stable Taking long acting topamax and metoprolol xl  Sees neurology      Relevant Medications   buPROPion (WELLBUTRIN XL) 150 MG 24 hr tablet   metoprolol succinate (TOPROL-XL) 25 MG 24 hr tablet   hydrochlorothiazide (HYDRODIURIL) 25 MG tablet     Other   Vitamin B12 deficiency    Due for a B12 level-will plan when we get other labs back from the outside    She is taking less (high level last time) - has not been getting shots as often      Hyperlipidemia    Pt has labs to bring in  Muscle pain from atovastatin -taking co enz Q-10 for this  May be able to change to crestor instead  Will see how labs look first      Relevant Medications   metoprolol succinate (TOPROL-XL) 25 MG 24 hr tablet   hydrochlorothiazide (HYDRODIURIL) 25 MG tablet   TOBACCO ABUSE    Disc in detail risks of smoking and possible outcomes including copd, vascular/ heart disease, cancer , respiratory and sinus infections  Pt voices understanding She is smoking a little less Not ready to quit        Prediabetes    Per pt labs at work 5.7 A1C disc imp of low glycemic diet and wt loss to prevent DM2       Screening mammogram, encounter for    Again encouraged pt strongly to get a mammogram  She has implants- I re assured her  She plans to schedule this at the [facility] breast center       Dysthymia    struggline a bit more  Will add wellbutrin xl 150 mg today  If side eff or worse headaches- inst to update  Discussed importance of self care and exercise if able      Relevant Medications   buPROPion (WELLBUTRIN XL) 150 MG 24 hr tablet   Low vitamin D level    Discussed the importance of D intake for bone and overall health  Discussed starting back on it  Will plan labs when her outside labs from work arrive      Routine general medical examination at a health care facility - Primary    Reviewed health habits including diet and exercise and skin cancer prevention Reviewed appropriate screening tests for age  Also reviewed health mt list, fam hx and immunization status , as well as social and family history   See HPI Labs pending from work/outside source  Disc need for smoking cessation  Disc need for mammogram-she will schedule  inst to start vitamin D 2000 iu daily for bone health

## 2019-03-05 DIAGNOSIS — Z0000 Encounter for general adult medical examination without abnormal findings: Secondary | ICD-10-CM | POA: Insufficient documentation

## 2019-03-05 DIAGNOSIS — Z0001 Encounter for general adult medical examination with abnormal findings: Secondary | ICD-10-CM | POA: Insufficient documentation

## 2019-03-05 NOTE — Assessment & Plan Note (Signed)
Discussed the importance of D intake for bone and overall health  Discussed starting back on it  Will plan labs when her outside labs from work arrive

## 2019-03-05 NOTE — Assessment & Plan Note (Signed)
Due for a B12 level-will plan when we get other labs back from the outside  She is taking less (high level last time) - has not been getting shots as often

## 2019-03-05 NOTE — Assessment & Plan Note (Signed)
struggline a bit more  Will add wellbutrin xl 150 mg today  If side eff or worse headaches- inst to update  Discussed importance of self care and exercise if able

## 2019-03-05 NOTE — Assessment & Plan Note (Signed)
Again encouraged pt strongly to get a mammogram  She has implants- I re assured her  She plans to schedule this at the [facility] breast center

## 2019-03-05 NOTE — Assessment & Plan Note (Signed)
Disc in detail risks of smoking and possible outcomes including copd, vascular/ heart disease, cancer , respiratory and sinus infections  Pt voices understanding She is smoking a little less Not ready to quit

## 2019-03-05 NOTE — Assessment & Plan Note (Signed)
bp in fair control at this time  BP Readings from Last 1 Encounters:  03/03/19 130/84   No changes needed Most recent labs reviewed  Disc lifstyle change with low sodium diet and exercise

## 2019-03-05 NOTE — Assessment & Plan Note (Signed)
Per pt labs at work 5.7 A1C disc imp of low glycemic diet and wt loss to prevent DM2

## 2019-03-05 NOTE — Assessment & Plan Note (Signed)
Pt has labs to bring in  Muscle pain from atovastatin -taking co enz Q-10 for this  May be able to change to crestor instead  Will see how labs look first

## 2019-03-05 NOTE — Assessment & Plan Note (Signed)
Clinically stable Taking long acting topamax and metoprolol xl  Sees neurology

## 2019-03-05 NOTE — Assessment & Plan Note (Signed)
Reviewed health habits including diet and exercise and skin cancer prevention Reviewed appropriate screening tests for age  Also reviewed health mt list, fam hx and immunization status , as well as social and family history   See HPI Labs pending from work/outside source  Disc need for smoking cessation  Disc need for mammogram-she will schedule  inst to start vitamin D 2000 iu daily for bone health

## 2019-03-06 MED ORDER — ROSUVASTATIN CALCIUM 10 MG PO TABS: 10.0000 mg | ORAL_TABLET | Freq: Every day | ORAL | 3 refills | Status: DC

## 2019-03-06 NOTE — Telephone Encounter (Signed)
Her cholesterol is up as suspected  Her LDL was over 170 and that is on lipitor  I want to change to crestor 10 mg (stronger and potentially less side effects I sent it to her pharmacy Please mail her booklet back to her  I want to re check lipid with ast/alt in 6 weeks  She also needs a vit D and B12 level at that time  Please ask if she plans to get those labs here or if I need to write a px order   Thanks

## 2019-03-07 NOTE — Telephone Encounter (Signed)
Pt notified of lab results and Dr. Marliss Coots comments and instructions. Pt's booklet was mailed back to her.  Pt said she gets her labs done for free at work and request we just mail her a lab order to her and she will take it to her work and have them done in 6 weeks.

## 2019-03-08 NOTE — Telephone Encounter (Signed)
Dr. Glori Bickers did lab orders and I mailed them to pt

## 2019-06-21 ENCOUNTER — Telehealth: Payer: Self-pay

## 2019-06-21 NOTE — Telephone Encounter (Signed)
Pt has had rt sided dull continuous pain under rt ribcage area for 1 wk and pain is worsening;pain seem worse when sitting. pt has taken hydrocodone and flexeril and pts pain level is a 7 now. It does not  Hurt to breathe and no coughing. Pt has not had any known injury. Pt does not have appendix but does have gall bladder. No fever and it does not hurt worse if pt touches that area on rt side. Pt said at this point she thinks she needs to go to ED due to hurting so badly. Pt is at work but will go to [employer] in [facility]; pt wanted to know if that facility could do xrays or imaging if needed. I called [facility] in [facility] ED dept and was told they do have imaging capability if needed but they do not do MRIs. Pt is going now to [facility] in [facility]. FYI to Dr Glori Bickers.

## 2019-06-21 NOTE — Telephone Encounter (Signed)
Thanks, I agree with that advisement and gallstones are possible  I will watch for correspondence in epic

## 2019-06-23 ENCOUNTER — Emergency Department
Admission: EM | Admit: 2019-06-23 | Discharge: 2019-06-23 | Disposition: A | Discharge status: Home / Self Care | Payer: 59 | Attending: Emergency Medicine | Admitting: Emergency Medicine

## 2019-06-23 ENCOUNTER — Other Ambulatory Visit: Payer: Self-pay

## 2019-06-23 ENCOUNTER — Encounter: Payer: Self-pay | Admitting: *Deleted

## 2019-06-23 DIAGNOSIS — R109 Unspecified abdominal pain: Secondary | ICD-10-CM | POA: Insufficient documentation

## 2019-06-23 DIAGNOSIS — Z5321 Procedure and treatment not carried out due to patient leaving prior to being seen by health care provider: Secondary | ICD-10-CM | POA: Diagnosis not present

## 2019-06-23 LAB — CBC
HCT: 43.2 % (ref 36.0–46.0)
Hemoglobin: 14.2 g/dL (ref 12.0–15.0)
MCH: 27.8 pg (ref 26.0–34.0)
MCHC: 32.9 g/dL (ref 30.0–36.0)
MCV: 84.7 fL (ref 80.0–100.0)
Platelets: 186 10*3/uL (ref 150–400)
RBC: 5.1 MIL/uL (ref 3.87–5.11)
RDW: 14.6 % (ref 11.5–15.5)
WBC: 7.3 10*3/uL (ref 4.0–10.5)
nRBC: 0 % (ref 0.0–0.2)

## 2019-06-23 LAB — COMPREHENSIVE METABOLIC PANEL
ALT: 24 U/L (ref 0–44)
AST: 25 U/L (ref 15–41)
Albumin: 4.4 g/dL (ref 3.5–5.0)
Alkaline Phosphatase: 49 U/L (ref 38–126)
Anion gap: 9 (ref 5–15)
BUN: 16 mg/dL (ref 6–20)
CO2: 27 mmol/L (ref 22–32)
Calcium: 9.4 mg/dL (ref 8.9–10.3)
Chloride: 103 mmol/L (ref 98–111)
Creatinine, Ser: 1.01 mg/dL — ABNORMAL HIGH (ref 0.44–1.00)
GFR calc Af Amer: >60 mL/min (ref >60)
GFR calc non Af Amer: >60 mL/min (ref >60)
Glucose, Bld: 100 mg/dL — ABNORMAL HIGH (ref 70–99)
Potassium: 3.4 mmol/L — ABNORMAL LOW (ref 3.5–5.1)
Sodium: 139 mmol/L (ref 135–145)
Total Bilirubin: 0.4 mg/dL (ref 0.3–1.2)
Total Protein: 7 g/dL (ref 6.5–8.1)

## 2019-06-23 LAB — LIPASE, BLOOD: Lipase: 41 U/L (ref 11–51)

## 2019-06-23 NOTE — ED Triage Notes (Signed)
Patient c/o right, upper abdominal pain for a week and a half. Patient states pain is not worse after eating.

## 2019-06-26 ENCOUNTER — Encounter: Payer: Self-pay | Admitting: Family Medicine

## 2019-06-27 NOTE — Telephone Encounter (Signed)
I called patient to schedule appointment.  Patient said she's feeling much better. Patient said if there are any changes, she'll call back to schedule appointment.

## 2019-07-17 ENCOUNTER — Other Ambulatory Visit: Payer: Self-pay | Admitting: *Deleted

## 2019-07-17 MED ORDER — TOPIRAMATE ER 100 MG PO SPRINKLE CAP24: EXTENDED_RELEASE_CAPSULE | ORAL | 3 refills | Status: DC

## 2019-07-19 ENCOUNTER — Telehealth: Payer: Self-pay | Admitting: *Deleted

## 2019-07-19 MED ORDER — TOPIRAMATE 100 MG PO TABS: 100.0000 mg | ORAL_TABLET | Freq: Every day | ORAL | 0 refills | Status: DC

## 2019-07-19 NOTE — Telephone Encounter (Signed)
Order placed for topiramate 100mg  po at bedtime (use until gets qudexy).

## 2019-07-19 NOTE — Telephone Encounter (Signed)
Yes. We can change to 100mg  BID. Please let her know that hopefully it is just temporary and we can switch back as soon as it is back in stock. She may want to call around to other pharmacies in her area to see if someone else has it in stock.

## 2019-07-19 NOTE — Telephone Encounter (Signed)
Yes. Qudexy is long acting. Topiramate IR needs twice daily dosing for same dose.

## 2019-07-19 NOTE — Telephone Encounter (Signed)
Received fax from pharmacy that Quinby is backordered.  Change to topiramate 100mg  tabs po qhs?

## 2019-07-20 ENCOUNTER — Telehealth (INDEPENDENT_AMBULATORY_CARE_PROVIDER_SITE_OTHER): Payer: 59 | Admitting: Family Medicine

## 2019-07-20 ENCOUNTER — Encounter: Payer: Self-pay | Admitting: Family Medicine

## 2019-07-20 DIAGNOSIS — G43001 Migraine without aura, not intractable, with status migrainosus: Secondary | ICD-10-CM

## 2019-07-20 NOTE — Progress Notes (Addendum)
PATIENT: Desiree Walters DOB: Aug 04, 1970  REASON FOR VISIT: follow up HISTORY FROM: patient  Virtual Visit via Telephone Note  I connected with Desiree Walters on 07/20/19 at 10:30 AM EDT by telephone and verified that I am speaking with the correct person using two identifiers.   I discussed the limitations, risks, security and privacy concerns of performing an evaluation and management service by telephone and the availability of in person appointments. I also discussed with the patient that there may be a patient responsible charge related to this service. The patient expressed understanding and agreed to proceed.   History of Present Illness:  07/20/19 Desiree Walters is a 49 y.o. female here today for follow up for migraines. She is doing great on Qudexy but, unfortunately, she has a high deductible plan and can not afford this medication. She does endorse some tingling of her fingers and toes but feels it is manageable. She has had 1-2 bad headaches over the past year. She is also on metoprolol for BP and Wellbutrin for anxiety/depression. She does use Norco regularly for arthritis pain.   History (copied from my note on 06/28/2018)  Desiree Walters is a 49 y.o. female for follow up for migraines. She continues Qudexy 100mg  XR at night.  She reports that her migraines have nearly resolved.  She cannot remember the last time she had a migraine.  She is not using any prescription abortive therapy.  Occasionally she will take Excedrin but cannot remember last time she is needed to do so.  She is doing very well on current therapy.   History (copied from Dr Cathren Laine note on 01/19/2017)  [id number] A [name] a 49 y.o.femalehere as a referral from Dr. Duanne Limerick migraines.PMHx tobacco abuse, pernicious anemia, OCD, obesity, HLD, hypersomnia, Fibromyalgia, DM.She had a virus 21 days ago and now have a headache for 21 days. She was treated with augmentin, she had fever and  sinus pressure, she tried prednisone, it didn't help. Most of the day she is fine,the headache feels likeshe has a skull cap on, pressure, if she bends over she feels like she is going to pass out with worse pain, pounding, throbbing, light sensitivity and sound sensitivity. Not her normal migraines, moving makes it worse at night, she is lightheaded and nauseated. Better in the morning, gradually gets worse over the course of the day. It has been years since she had a headache, but it is the same possibly she used to get status migrainosus. 9 days of steroids did not help. Took a little of the edge off. She has tried gabapentin, she took Topiramate from 5-6 years ago and made her dizzy. She takes oxycodone every day for chronic painand has been told shemaybehaMS. She took an excedrin migraine and it worked during the day but the headache returned.No other focal neurologic deficits, associated symptoms, inciting events or modifiable factors.She has chronic low back pain.  Reviewed notes, labs and imaging from outside physicians, which showed:  MRI lumbar spine 05/2007:  IMPRESSION:  1. Shallow right foraminal disc protrusion at L4-5 could irritate the right L-4 nerve root. No significant spinal or lateral recess stenosis in the lumbar spine.  2. Benign-appearing Tarlov cyst behind S-2.  3. Incidental note is made of an exophytic lesion off of the fundal region of the uterus. It may just be a degenerated fibroid but the signal characteristics are not typical. Dedicated MRI of the pelvis may be helpful for better evaluation of this abnormality.  Observations/Objective:  Generalized: Well developed, in no acute distress  Mentation: Alert oriented to time, place, history taking. Follows all commands speech and language fluent   Assessment and Plan:  49 y.o. year old female  has a past medical history of Diabetes mellitus, Fibromyalgia, Hyperlipemia, Hypersomnia, Obesity, OCD (obsessive  compulsive disorder), Pernicious anemia, and Tobacco abuse. here with    ICD-10-CM   1. Migraine without aura and with status migrainosus, not intractable  G43.001    Desiree Walters has done very well on Qudexy 100 mg daily.  She has had 1-2 headaches over the past year that were easily aborted with Excedrin.  She is unable to continue Qudexy due to cost.  We will switch her to topiramate IR 100 mg at bedtime.  We have discussed common side effects of this medication.  She has done very well on Qudexy and I suspect she will do fine on topiramate IR.  She may continue abortive therapy with Excedrin as needed.  She will continue close follow-up with her primary care and pain management providers.  Adequate hydration, well-balanced diet regular exercise advised.  We have discussed possibility of trying a CGRP in the future should migraines worsen.  Side effects reviewed.  She will call with any concerning symptoms.  We will see her back in 1 year, sooner if needed.  She verbalizes understanding and agreement with this plan.   No orders of the defined types were placed in this encounter.   No orders of the defined types were placed in this encounter.    Follow Up Instructions:  I discussed the assessment and treatment plan with the patient. The patient was provided an opportunity to ask questions and all were answered. The patient agreed with the plan and demonstrated an understanding of the instructions.   The patient was advised to call back or seek an in-person evaluation if the symptoms worsen or if the condition fails to improve as anticipated.  I provided 20 minutes of non-face-to-face time during this encounter.  Patient is located at her place of residence during my chart visit.  Provider is located in the office.   Debbora Presto, NP   Made any corrections needed, and agree with history, physical, neuro exam,assessment and plan as stated.     Sarina Ill, MD [facility]

## 2019-08-16 ENCOUNTER — Other Ambulatory Visit: Payer: Self-pay | Admitting: *Deleted

## 2019-08-16 ENCOUNTER — Encounter: Payer: Self-pay | Admitting: Family Medicine

## 2019-08-16 MED ORDER — TOPIRAMATE 100 MG PO TABS: 100.0000 mg | ORAL_TABLET | Freq: Every day | ORAL | 0 refills | Status: DC

## 2019-08-27 ENCOUNTER — Other Ambulatory Visit: Payer: Self-pay | Admitting: Family Medicine

## 2019-09-16 ENCOUNTER — Other Ambulatory Visit: Payer: Self-pay | Admitting: Family Medicine

## 2019-09-18 MED ORDER — TOPIRAMATE 100 MG PO TABS: 100.0000 mg | ORAL_TABLET | Freq: Every day | ORAL | 0 refills | Status: DC

## 2019-09-25 ENCOUNTER — Encounter: Payer: Self-pay | Admitting: Family Medicine

## 2019-09-26 MED ORDER — TOPIRAMATE 100 MG PO TABS: 100.0000 mg | ORAL_TABLET | Freq: Every day | ORAL | 3 refills | Status: DC

## 2019-11-09 ENCOUNTER — Encounter: Payer: Self-pay | Admitting: Family Medicine

## 2019-11-10 MED ORDER — BUPROPION HCL ER (XL) 300 MG PO TB24: 300.0000 mg | ORAL_TABLET | Freq: Every day | ORAL | 3 refills | Status: DC

## 2020-02-18 ENCOUNTER — Encounter: Payer: Self-pay | Admitting: Family Medicine

## 2020-02-19 ENCOUNTER — Telehealth: Payer: Self-pay | Admitting: Family Medicine

## 2020-02-19 NOTE — Telephone Encounter (Signed)
Lab orders mailed

## 2020-02-19 NOTE — Telephone Encounter (Signed)
Order done and in IN box  

## 2020-02-19 NOTE — Telephone Encounter (Signed)
Patient has a cpx on 03/06/20.  Patient works for [employer]  Patient's requesting a lab order for her cpx be mailed to her, so she can have her labs done before her cpx.

## 2020-02-19 NOTE — Telephone Encounter (Signed)
Pt advised she needs an appt for UTI sxs, please see her response

## 2020-02-27 ENCOUNTER — Other Ambulatory Visit: Payer: Self-pay | Admitting: Family Medicine

## 2020-03-06 ENCOUNTER — Encounter: Payer: 59 | Admitting: Family Medicine

## 2020-03-06 ENCOUNTER — Ambulatory Visit (INDEPENDENT_AMBULATORY_CARE_PROVIDER_SITE_OTHER): Payer: 59 | Admitting: Family Medicine

## 2020-03-06 ENCOUNTER — Other Ambulatory Visit: Payer: Self-pay

## 2020-03-06 ENCOUNTER — Encounter: Payer: Self-pay | Admitting: Family Medicine

## 2020-03-06 VITALS — BP 110/68 | HR 88 | Temp 96.9°F | Ht 64.5 in | Wt 174.2 lb

## 2020-03-06 DIAGNOSIS — R7303 Prediabetes: Secondary | ICD-10-CM

## 2020-03-06 DIAGNOSIS — Z0000 Encounter for general adult medical examination without abnormal findings: Secondary | ICD-10-CM

## 2020-03-06 DIAGNOSIS — F17200 Nicotine dependence, unspecified, uncomplicated: Secondary | ICD-10-CM

## 2020-03-06 DIAGNOSIS — I10 Essential (primary) hypertension: Secondary | ICD-10-CM

## 2020-03-06 DIAGNOSIS — Z1231 Encounter for screening mammogram for malignant neoplasm of breast: Secondary | ICD-10-CM

## 2020-03-06 DIAGNOSIS — E538 Deficiency of other specified B group vitamins: Secondary | ICD-10-CM

## 2020-03-06 DIAGNOSIS — R1011 Right upper quadrant pain: Secondary | ICD-10-CM | POA: Insufficient documentation

## 2020-03-06 DIAGNOSIS — Z0001 Encounter for general adult medical examination with abnormal findings: Secondary | ICD-10-CM

## 2020-03-06 DIAGNOSIS — E7800 Pure hypercholesterolemia, unspecified: Secondary | ICD-10-CM

## 2020-03-06 DIAGNOSIS — F341 Dysthymic disorder: Secondary | ICD-10-CM

## 2020-03-06 NOTE — Assessment & Plan Note (Signed)
Disc goals for lipids and reasons to control them Rev last labs with pt Rev low sat fat diet in detail Labs today  Continues generic crestor and diet

## 2020-03-06 NOTE — Assessment & Plan Note (Signed)
a1c today  Fair diet/no big changes disc imp of low glycemic diet and wt loss to prevent DM2  Would like to start exercise

## 2020-03-06 NOTE — Assessment & Plan Note (Signed)
Reviewed health habits including diet and exercise and skin cancer prevention Reviewed appropriate screening tests for age  Also reviewed health mt list, fam hx and immunization status , as well as social and family history   See HPI Labs ordered  Enc to quit smoking  Plans to schedule mammogram- (info given and order done)

## 2020-03-06 NOTE — Assessment & Plan Note (Signed)
First screen mammogram ordered again  Pt states she will schedule this  Given info/phone #  No lumps on self exam  Has implants Nl in office exam today

## 2020-03-06 NOTE — Patient Instructions (Addendum)
Make sure to schedule your mammogram   To prevent diabetes  Try to get most of your carbohydrates from produce (with the exception of white potatoes)  Eat less bread/pasta/rice/snack foods/cereals/sweets and other items from the middle of the grocery store (processed carbs)   Keep thinking about quitting smoking   I would like to schedule an abdominal ultrasound later   Labs today

## 2020-03-06 NOTE — Assessment & Plan Note (Signed)
bp in fair control at this time  BP Readings from Last 1 Encounters:  03/06/20 110/68   No changes needed Most recent labs reviewed  Disc lifstyle change with low sodium diet and exercise  Plan to continue hctz 25 mg daily  Labs today

## 2020-03-06 NOTE — Assessment & Plan Note (Signed)
Disc in detail risks of smoking and possible outcomes including copd, vascular/ heart disease, cancer , respiratory and sinus infections  Pt voices understanding Pt is not currently ready to quit

## 2020-03-06 NOTE — Progress Notes (Signed)
Subjective:    Patient ID: Desiree Walters, female    DOB: 07/16/1970, 49 y.o.   MRN: [id number]  This visit occurred during the SARS-CoV-2 public health emergency.  Safety protocols were in place, including screening questions prior to the visit, additional usage of staff PPE, and extensive cleaning of exam room while observing appropriate contact time as indicated for disinfecting solutions.    HPI Here for health maintenance exam and to review chronic medical problems    Wt Readings from Last 3 Encounters:  03/06/20 174 lb 3 oz (79 kg)  06/23/19 175 lb (79.4 kg)  03/03/19 174 lb (78.9 kg)   29.44 kg/m   Dealing with GI issues  Hurts to sit (no change with eating)  Hurts in ribs  No n/v/d or constipation  Feels like a side stitch  R side   Other than that feels ok  Good self care  Not a lot of exercise due to the side pain  Walking is what she likes to do - wants a tonal    Mammogram - has not had it yet (no time due to work)  Needs order  Self breast exam - no lumps (has implants)    Tdap 10/18 covid pfizer with booster Flu 10/21  Smoking status - avg less than 1ppd  No quit date now  Not ready to quit   HTN bp in fair control at this time  BP Readings from Last 1 Encounters:  03/06/20 110/68  taking hctz 25 mg and metoprolol xl 25 mg daily   Pulse Readings from Last 3 Encounters:  03/06/20 88  06/23/19 88  03/03/19 82    Due for labs   B12 def Lab Results  Component Value Date   VITAMINB12 1,565 (H) 12/21/2016  last shot was 3 wk ago  Supposed to do monthly - (but usually every other mo)   Prediabetes Lab Results  Component Value Date   HGBA1C 6.1 02/18/2018   Due for labs  Not mindful of diet  About the same  No sweet tea  Eats sweets   Hyperlipidemia Lab Results  Component Value Date   CHOL 216 (H) 02/18/2018   HDL 50.60 02/18/2018   LDLCALC 99 12/21/2016   LDLDIRECT 159.0 02/18/2018   TRIG 224.0 (H) 02/18/2018   CHOLHDL 4  02/18/2018   crestor and diet  Is mindful of diet    Dysthymia  wellbutrin - helps  Still irritable  Does not want to change   A lot of stress -kid with ADD   Patient Active Problem List   Diagnosis Date Noted  . Right upper quadrant pain 03/06/2020  . Routine general medical examination at a health care facility 03/05/2019  . Dysthymia 03/03/2019  . Low vitamin D level 03/03/2019  . Screening mammogram, encounter for 02/18/2018  . Migraine without aura 01/21/2017  . Menopausal syndrome (hot flashes) 05/09/2014  . Vitamin B12 deficiency 10/09/2009  . Hyperlipidemia 10/09/2009  . Prediabetes 10/09/2009  . RHINITIS 05/10/2008  . HYPERSOMNIA 09/26/2007  . Obstructive sleep apnea 08/20/2006  . TOBACCO ABUSE 08/04/2006  . Essential hypertension 08/04/2006  . OBSESSIVE-COMPULSIVE DISORDER 07/26/2006  . FIBROMYALGIA 07/26/2006   Past Medical History:  Diagnosis Date  . Diabetes mellitus   . Fibromyalgia   . Hyperlipemia   . Hypersomnia    NPSG 08-25-07-1.2/hr  . Obesity   . OCD (obsessive compulsive disorder)   . Pernicious anemia   . Tobacco abuse    Past Surgical History:  Procedure Laterality Date  . ABDOMINAL HYSTERECTOMY    . BREAST ENHANCEMENT SURGERY  2003  . FOOT SURGERY  2005; 2008  . IUD - [place]  2008  . KNEE SURGERY Left   . NASAL SEPTUM SURGERY     Social History   Tobacco Use  . Smoking status: Current Every Day Smoker    Packs/day: 1.00    Years: 20.00    Pack years: 20.00    Types: Cigarettes  . Smokeless tobacco: Never Used  Vaping Use  . Vaping Use: Never used  Substance Use Topics  . Alcohol use: Yes    Alcohol/week: 3.0 standard drinks    Types: 3 Standard drinks or equivalent per week    Comment: occasional  . Drug use: No   Family History  Problem Relation Age of Onset  . Hypertension Father   . Coronary artery disease Father   . Sleep apnea Father        with CPAP  . Heart disease Father        CAD  . Hyperlipidemia Father    . Hypertension Mother   . Hyperlipidemia Mother   . Hypertension Sister   . Hyperlipidemia Other        aunts/uncles  . Coronary artery disease Other        aunts/uncles  . Stroke Maternal Grandmother   . Multiple sclerosis Neg Hx    Allergies  Allergen Reactions  . Ciprofloxacin     REACTION: reaction not known  . Levofloxacin     REACTION: reaction not known  . Quinolones     Other reaction(s): Unknown  . Varenicline Tartrate     REACTION: headache   Current Outpatient Medications on File Prior to Visit  Medication Sig Dispense Refill  . buPROPion (WELLBUTRIN XL) 300 MG 24 hr tablet Take 1 tablet (300 mg total) by mouth daily. 90 tablet 3  . clonazePAM (KLONOPIN) 0.5 MG tablet Take 1 tablet by mouth at bedtime.    . cyanocobalamin (,VITAMIN B-12,) 1000 MCG/ML injection INJECT 1 ML INTO THE MUSCLE ONCE A MONTH 3 mL 1  . cyclobenzaprine (FLEXERIL) 10 MG tablet Take 1 tablet by mouth 3 (three) times daily as needed.    . hydrochlorothiazide (HYDRODIURIL) 25 MG tablet TAKE 1 TABLET BY MOUTH EVERY DAY 90 tablet 1  . HYDROcodone-acetaminophen (NORCO) 10-325 MG per tablet Take 1 tablet by mouth every 4 (four) hours as needed.    . metoprolol succinate (TOPROL-XL) 25 MG 24 hr tablet TAKE 1 TABLET BY MOUTH EVERY DAY 90 tablet 1  . rosuvastatin (CRESTOR) 10 MG tablet TAKE 1 TABLET BY MOUTH EVERY DAY 90 tablet 1  . topiramate (TOPAMAX) 100 MG tablet Take 1 tablet (100 mg total) by mouth at bedtime. 90 tablet 3   No current facility-administered medications on file prior to visit.     Review of Systems  Constitutional: Negative for chills, fever and malaise/fatigue.       Fatigue from schedule   HENT: Negative for congestion, ear pain, sinus pain and sore throat.   Eyes: Negative for blurred vision, discharge and redness.  Respiratory: Negative for cough, shortness of breath and stridor.   Cardiovascular: Negative for chest pain, palpitations and leg swelling.  Gastrointestinal:  Negative for abdominal pain, diarrhea, nausea and vomiting.  Musculoskeletal: Negative for myalgias.  Skin: Negative for rash.  Neurological: Negative for dizziness and headaches.  Psychiatric/Behavioral:       Much stress irritable  Objective:   Physical Exam Constitutional:      General: She is not in acute distress.    Appearance: Normal appearance. She is well-developed. She is not ill-appearing or diaphoretic.     Comments: overwt  HENT:     Head: Normocephalic and atraumatic.     Right Ear: Tympanic membrane, ear canal and external ear normal.     Left Ear: Tympanic membrane, ear canal and external ear normal.     Nose: Nose normal. No congestion.     Mouth/Throat:     Mouth: Mucous membranes are moist.     Pharynx: Oropharynx is clear. No posterior oropharyngeal erythema.  Eyes:     General: No scleral icterus.    Extraocular Movements: Extraocular movements intact.     Conjunctiva/sclera: Conjunctivae normal.     Pupils: Pupils are equal, round, and reactive to light.  Neck:     Thyroid: No thyromegaly.     Vascular: No carotid bruit or JVD.  Cardiovascular:     Rate and Rhythm: Normal rate and regular rhythm.     Pulses: Normal pulses.     Heart sounds: Normal heart sounds. No gallop.   Pulmonary:     Effort: Pulmonary effort is normal. No respiratory distress.     Breath sounds: Normal breath sounds. No wheezing.     Comments: Good air exch Chest:     Chest wall: No tenderness.  Abdominal:     General: Bowel sounds are normal. There is no distension or abdominal bruit.     Palpations: Abdomen is soft. There is no mass.     Tenderness: There is abdominal tenderness.     Hernia: No hernia is present.     Comments: Some tenderness with very deep palp of RUQ abd  No cva tenderness No rib tenderness  Genitourinary:    Comments: Breast exam: No mass, nodules, thickening, tenderness, bulging, retraction, inflamation, nipple discharge or skin changes noted.   No axillary or clavicular LA.     bilat breast implants noted  Musculoskeletal:        General: No tenderness. Normal range of motion.     Cervical back: Normal range of motion and neck supple. No rigidity. No muscular tenderness.     Right lower leg: No edema.     Left lower leg: No edema.  Lymphadenopathy:     Cervical: No cervical adenopathy.  Skin:    General: Skin is warm and dry.     Coloration: Skin is not pale.     Findings: No erythema or rash.     Comments: Solar lentigines diffusely   Neurological:     Mental Status: She is alert. Mental status is at baseline.     Cranial Nerves: No cranial nerve deficit.     Motor: No abnormal muscle tone.     Coordination: Coordination normal.     Gait: Gait normal.     Deep Tendon Reflexes: Reflexes are normal and symmetric. Reflexes normal.  Psychiatric:        Mood and Affect: Mood normal.        Cognition and Memory: Cognition and memory normal.     Comments: Pleasant  Voices frustration with stressors            Assessment & Plan:   Problem List Items Addressed This Visit      Cardiovascular and Mediastinum   Essential hypertension    bp in fair control at this time  BP Readings from  Last 1 Encounters:  03/06/20 110/68   No changes needed Most recent labs reviewed  Disc lifstyle change with low sodium diet and exercise  Plan to continue hctz 25 mg daily  Labs today      Relevant Orders   CBC with Differential/Platelet   Comprehensive metabolic panel   Lipid panel   TSH     Other   Vitamin B12 deficiency    Level today  Supposed to get B12 shot monthly but avg every other mo Last shot was 3 wk ago       Relevant Orders   Vitamin B12   Hyperlipidemia    Disc goals for lipids and reasons to control them Rev last labs with pt Rev low sat fat diet in detail Labs today  Continues generic crestor and diet       Relevant Orders   Lipid panel   TOBACCO ABUSE    Disc in detail risks of smoking and  possible outcomes including copd, vascular/ heart disease, cancer , respiratory and sinus infections  Pt voices understanding Pt is not currently ready to quit       Prediabetes    a1c today  Fair diet/no big changes disc imp of low glycemic diet and wt loss to prevent DM2  Would like to start exercise       Relevant Orders   Hemoglobin A1c   Screening mammogram, encounter for    First screen mammogram ordered again  Pt states she will schedule this  Given info/phone #  No lumps on self exam  Has implants Nl in office exam today      Relevant Orders   MM 3D SCREEN BREAST BILATERAL   Dysthymia    With irritability and stressors  Reviewed stressors/ coping techniques/symptoms/ support sources/ tx options and side effects in detail today  Plans to continue wellbutrin       Encounter for general adult medical examination with abnormal findings - Primary    Reviewed health habits including diet and exercise and skin cancer prevention Reviewed appropriate screening tests for age  Also reviewed health mt list, fam hx and immunization status , as well as social and family history   See HPI Labs ordered  Enc to quit smoking  Plans to schedule mammogram- (info given and order done)      Right upper quadrant pain    This is new  Feels like a side stitch  Considering abd [place] to see gb Labs ordered today

## 2020-03-06 NOTE — Assessment & Plan Note (Signed)
This is new  Feels like a side stitch  Considering abd [place] to see gb Labs ordered today

## 2020-03-06 NOTE — Assessment & Plan Note (Signed)
With irritability and stressors  Reviewed stressors/ coping techniques/symptoms/ support sources/ tx options and side effects in detail today  Plans to continue wellbutrin

## 2020-03-06 NOTE — Assessment & Plan Note (Signed)
Level today  Supposed to get B12 shot monthly but avg every other mo Last shot was 3 wk ago

## 2020-03-07 LAB — COMPREHENSIVE METABOLIC PANEL
AG Ratio: 1.8 (calc) (ref 1.0–2.5)
ALT: 20 U/L (ref 6–29)
AST: 21 U/L (ref 10–35)
Albumin: 4.8 g/dL (ref 3.6–5.1)
Alkaline phosphatase (APISO): 51 U/L (ref 31–125)
BUN: 13 mg/dL (ref 7–25)
CO2: 25 mmol/L (ref 20–32)
Calcium: 10.2 mg/dL (ref 8.6–10.2)
Chloride: 105 mmol/L (ref 98–110)
Creat: 0.86 mg/dL (ref 0.50–1.10)
Globulin: 2.6 g/dL (calc) (ref 1.9–3.7)
Glucose, Bld: 97 mg/dL (ref 65–99)
Potassium: 4.1 mmol/L (ref 3.5–5.3)
Sodium: 141 mmol/L (ref 135–146)
Total Bilirubin: 0.5 mg/dL (ref 0.2–1.2)
Total Protein: 7.4 g/dL (ref 6.1–8.1)

## 2020-03-07 LAB — CBC WITH DIFFERENTIAL/PLATELET
Absolute Monocytes: 496 cells/uL (ref 200–950)
Basophils Absolute: 29 cells/uL (ref 0–200)
Basophils Relative: 0.4 %
Eosinophils Absolute: 117 cells/uL (ref 15–500)
Eosinophils Relative: 1.6 %
HCT: 45.6 % — ABNORMAL HIGH (ref 35.0–45.0)
Hemoglobin: 15.3 g/dL (ref 11.7–15.5)
Lymphs Abs: 2329 cells/uL (ref 850–3900)
MCH: 27.7 pg (ref 27.0–33.0)
MCHC: 33.6 g/dL (ref 32.0–36.0)
MCV: 82.6 fL (ref 80.0–100.0)
MPV: 13.1 fL — ABNORMAL HIGH (ref 7.5–12.5)
Monocytes Relative: 6.8 %
Neutro Abs: 4329 cells/uL (ref 1500–7800)
Neutrophils Relative %: 59.3 %
Platelets: 230 10*3/uL (ref 140–400)
RBC: 5.52 10*6/uL — ABNORMAL HIGH (ref 3.80–5.10)
RDW: 14.1 % (ref 11.0–15.0)
Total Lymphocyte: 31.9 %
WBC: 7.3 10*3/uL (ref 3.8–10.8)

## 2020-03-07 LAB — HEMOGLOBIN A1C
Hgb A1c MFr Bld: 6 % of total Hgb — ABNORMAL HIGH (ref <5.7)
Mean Plasma Glucose: 126 mg/dL
eAG (mmol/L): 7 mmol/L

## 2020-03-07 LAB — LIPID PANEL
Cholesterol: 161 mg/dL (ref <200)
HDL: 48 mg/dL — ABNORMAL LOW (ref >=50)
LDL Cholesterol (Calc): 87 mg/dL (calc)
Non-HDL Cholesterol (Calc): 113 mg/dL (calc) (ref <130)
Total CHOL/HDL Ratio: 3.4 (calc) (ref <5.0)
Triglycerides: 162 mg/dL — ABNORMAL HIGH (ref <150)

## 2020-03-07 LAB — TSH: TSH: 0.75 mIU/L

## 2020-03-07 LAB — VITAMIN B12: Vitamin B-12: >2000 pg/mL — ABNORMAL HIGH (ref 200–1100)

## 2020-03-11 ENCOUNTER — Telehealth: Payer: Self-pay | Admitting: Family Medicine

## 2020-03-11 DIAGNOSIS — R1011 Right upper quadrant pain: Secondary | ICD-10-CM

## 2020-03-11 NOTE — Telephone Encounter (Signed)
Please let pt know I put referral in for abd [place] and she will get a call to set that up  I preferenced [facility]

## 2020-03-12 NOTE — Telephone Encounter (Signed)
Pt.notified

## 2020-03-18 ENCOUNTER — Other Ambulatory Visit: Payer: Self-pay

## 2020-03-18 ENCOUNTER — Ambulatory Visit (INDEPENDENT_AMBULATORY_CARE_PROVIDER_SITE_OTHER): Payer: 59

## 2020-03-18 ENCOUNTER — Ambulatory Visit (HOSPITAL_BASED_OUTPATIENT_CLINIC_OR_DEPARTMENT_OTHER): Payer: 59

## 2020-03-18 DIAGNOSIS — K760 Fatty (change of) liver, not elsewhere classified: Secondary | ICD-10-CM | POA: Diagnosis not present

## 2020-03-18 DIAGNOSIS — K838 Other specified diseases of biliary tract: Secondary | ICD-10-CM | POA: Diagnosis not present

## 2020-03-18 DIAGNOSIS — R1011 Right upper quadrant pain: Secondary | ICD-10-CM

## 2020-03-19 ENCOUNTER — Telehealth: Payer: Self-pay | Admitting: Family Medicine

## 2020-03-19 DIAGNOSIS — R1011 Right upper quadrant pain: Secondary | ICD-10-CM

## 2020-03-19 NOTE — Telephone Encounter (Signed)
-----   Message from Shon Millet, [place] sent at 03/19/2020  4:28 PM EST ----- Pt viewed results via mychart. She does agree with GI referral. She doesn't have a preference she said either [place] or [place] is fine, whoever Dr. Milinda Antis thinks she should see

## 2020-03-21 ENCOUNTER — Encounter: Payer: Self-pay | Admitting: Physician Assistant

## 2020-03-26 ENCOUNTER — Other Ambulatory Visit: Payer: 59

## 2020-03-26 ENCOUNTER — Ambulatory Visit (INDEPENDENT_AMBULATORY_CARE_PROVIDER_SITE_OTHER): Payer: 59 | Admitting: Physician Assistant

## 2020-03-26 ENCOUNTER — Encounter: Payer: Self-pay | Admitting: Physician Assistant

## 2020-03-26 VITALS — BP 130/84 | HR 72 | Ht 65.0 in | Wt 182.0 lb

## 2020-03-26 DIAGNOSIS — R935 Abnormal findings on diagnostic imaging of other abdominal regions, including retroperitoneum: Secondary | ICD-10-CM

## 2020-03-26 DIAGNOSIS — R1011 Right upper quadrant pain: Secondary | ICD-10-CM | POA: Diagnosis not present

## 2020-03-26 DIAGNOSIS — K760 Fatty (change of) liver, not elsewhere classified: Secondary | ICD-10-CM | POA: Diagnosis not present

## 2020-03-26 NOTE — Progress Notes (Signed)
Chief Complaint: Right upper quadrant abdominal pain  HPI:    Desiree Walters is a 50 year old Caucasian female with a past medical history as listed below including fibromyalgia, who was referred to me by Abner Greenspan, MD for a complaint of right upper quadrant pain.      03/06/2020 B12 greater than 2000, TSH normal, CBC normal and CMP normal.    03/18/2020 ultrasound of the abdomen with common bile duct dilation measuring up to 12 mm, no gallstones visualized, MRI/MRCP recommended for further eval.  Hepatic steatosis.    Today, the patient tells me that 6 months ago she started with a right upper quadrant "discomfort".  Tells me that this seems to worsen throughout the day if she is very active and on her way home from work she tends to notice it more than other times.  At that time she would rate it as a 2/10 as far as pain but most of the time it is just uncomfortable.  Does not seem to be worsened with anything other than activity.  She tried taking Ibuprofen and other NSAIDs which were of no help.  Denies any change in physical activity prior to onset of pain.    Denies fever, chills, weight loss, change in bowel habits, change in diet, nausea, vomiting, heartburn, reflux or symptoms that awaken her from sleep.  Past Medical History:  Diagnosis Date  . Diabetes mellitus   . Fibromyalgia   . Hyperlipemia   . Hypersomnia    NPSG 08-25-07-1.2/hr  . Obesity   . OCD (obsessive compulsive disorder)   . Pernicious anemia   . Tobacco abuse     Past Surgical History:  Procedure Laterality Date  . ABDOMINAL HYSTERECTOMY    . BREAST ENHANCEMENT SURGERY  2003  . FOOT SURGERY  2005; 2008  . IUD - [place]  2008  . KNEE SURGERY Left   . NASAL SEPTUM SURGERY      Current Outpatient Medications  Medication Sig Dispense Refill  . buPROPion (WELLBUTRIN XL) 300 MG 24 hr tablet Take 1 tablet (300 mg total) by mouth daily. 90 tablet 3  . clonazePAM (KLONOPIN) 0.5 MG tablet Take 1 tablet by mouth  at bedtime.    . cyanocobalamin (,VITAMIN B-12,) 1000 MCG/ML injection INJECT 1 ML INTO THE MUSCLE ONCE A MONTH (Patient taking differently: INJECT 1 ML INTO THE MUSCLE EVERY 2-3 MONTHS) 3 mL 1  . cyclobenzaprine (FLEXERIL) 10 MG tablet Take 1 tablet by mouth 3 (three) times daily as needed.    . hydrochlorothiazide (HYDRODIURIL) 25 MG tablet TAKE 1 TABLET BY MOUTH EVERY DAY 90 tablet 1  . HYDROcodone-acetaminophen (NORCO) 10-325 MG per tablet Take 1 tablet by mouth every 4 (four) hours as needed.    . metoprolol succinate (TOPROL-XL) 25 MG 24 hr tablet TAKE 1 TABLET BY MOUTH EVERY DAY 90 tablet 1  . rosuvastatin (CRESTOR) 10 MG tablet TAKE 1 TABLET BY MOUTH EVERY DAY 90 tablet 1  . topiramate (TOPAMAX) 100 MG tablet Take 1 tablet (100 mg total) by mouth at bedtime. 90 tablet 3   No current facility-administered medications for this visit.    Allergies as of 03/26/2020 - Review Complete 03/26/2020  Allergen Reaction Noted  . Ciprofloxacin  07/26/2006  . Levofloxacin  07/26/2006  . Quinolones  08/19/2015  . Varenicline tartrate  08/04/2006    Family History  Problem Relation Age of Onset  . Hypertension Father   . Coronary artery disease Father   . Sleep  apnea Father        with CPAP  . Heart disease Father        CAD  . Hyperlipidemia Father   . Hypertension Mother   . Hyperlipidemia Mother   . Hypertension Sister   . Hyperlipidemia Other        aunts/uncles  . Coronary artery disease Other        aunts/uncles  . Stroke Maternal Grandmother   . Multiple sclerosis Neg Hx   . Colon cancer Neg Hx   . Pancreatic cancer Neg Hx   . Esophageal cancer Neg Hx     Social History   Socioeconomic History  . Marital status: Divorced    Spouse name: Not on file  . Number of children: 2  . Years of education: Not on file  . Highest education level: Bachelor's degree (e.g., BA, AB, BS)  Occupational History  . Occupation: med Estate agent  Tobacco Use  . Smoking status:  Current Every Day Smoker    Packs/day: 1.00    Years: 20.00    Pack years: 20.00    Types: Cigarettes  . Smokeless tobacco: Never Used  Vaping Use  . Vaping Use: Never used  Substance and Sexual Activity  . Alcohol use: Yes    Alcohol/week: 3.0 standard drinks    Types: 3 Standard drinks or equivalent per week    Comment: occasional  . Drug use: No  . Sexual activity: Not on file  Other Topics Concern  . Not on file  Social History Narrative   Desiree Walters's daughter   Daughter is unmarried daughter; drug problem   Has custody for grandchild   Right handed   7 cups of caffeine daily         Lives with boyfriend, grandson, and boyfriend's son    Social Determinants of Health   Financial Resource Strain: Not on file  Food Insecurity: Not on file  Transportation Needs: Not on file  Physical Activity: Not on file  Stress: Not on file  Social Connections: Not on file  Intimate Partner Violence: Not on file    Review of Systems:    Constitutional: No weight loss, fever or chills Skin: No rash  Cardiovascular: No chest pain  Respiratory: No SOB Gastrointestinal: See HPI and otherwise negative Genitourinary: No dysuria Neurological: No headache, dizziness or syncope Musculoskeletal: No new muscle or joint pain Hematologic: No bleeding  Psychiatric: No history of depression or anxiety   Physical Exam:  Vital signs: BP 130/84   Pulse 72   Ht 5\' 5"  (1.651 m)   Wt 182 lb (82.6 kg)   BMI 30.29 kg/m   Constitutional:   Pleasant overweight Caucasian female appears to be in NAD, Well developed, Well nourished, alert and cooperative Head:  Normocephalic and atraumatic. Eyes:   PEERL, EOMI. No icterus. Conjunctiva pink. Ears:  Normal auditory acuity. Neck:  Supple Throat: Oral cavity and pharynx without inflammation, swelling or lesion.  Respiratory: Respirations even and unlabored. Lungs clear to auscultation bilaterally.   No wheezes, crackles, or rhonchi.   Cardiovascular: Normal S1, S2. No MRG. Regular rate and rhythm. No peripheral edema, cyanosis or pallor.  Gastrointestinal:  Soft, nondistended, mild RUQ ttp. No rebound or guarding. Normal bowel sounds. No appreciable masses or hepatomegaly. Rectal:  Not performed.  Msk:  Symmetrical without gross deformities. Without edema, no deformity or joint abnormality.  Neurologic:  Alert and  oriented x4;  grossly normal neurologically.  Skin:  Dry and intact without significant lesions or rashes. Psychiatric: Demonstrates good judgement and reason without abnormal affect or behaviors.  RELEVANT LABS AND IMAGING: CBC    Component Value Date/Time   WBC 7.3 03/06/2020 1101   RBC 5.52 (H) 03/06/2020 1101   HGB 15.3 03/06/2020 1101   HCT 45.6 (H) 03/06/2020 1101   PLT 230 03/06/2020 1101   MCV 82.6 03/06/2020 1101   MCH 27.7 03/06/2020 1101   MCHC 33.6 03/06/2020 1101   RDW 14.1 03/06/2020 1101   LYMPHSABS 2,329 03/06/2020 1101   MONOABS 0.4 02/18/2018 1131   EOSABS 117 03/06/2020 1101   BASOSABS 29 03/06/2020 1101    CMP     Component Value Date/Time   NA 141 03/06/2020 1101   K 4.1 03/06/2020 1101   CL 105 03/06/2020 1101   CO2 25 03/06/2020 1101   GLUCOSE 97 03/06/2020 1101   BUN 13 03/06/2020 1101   CREATININE 0.86 03/06/2020 1101   CALCIUM 10.2 03/06/2020 1101   PROT 7.4 03/06/2020 1101   ALBUMIN 4.4 06/23/2019 1836   AST 21 03/06/2020 1101   ALT 20 03/06/2020 1101   ALKPHOS 49 06/23/2019 1836   BILITOT 0.5 03/06/2020 1101   GFRNONAA >60 06/23/2019 1836   GFRNONAA 73 01/19/2017 1448   GFRAA >60 06/23/2019 1836   GFRAA 85 01/19/2017 1448    Assessment: 1.  Right upper quadrant pain: For the past 6 months, seems only worse after an active day, no change with eating or bowel movements, abnormal ultrasound showing dilated bile duct, but normal LFTs; consider bile duct stone versus gastritis versus relation of fibromyalgia? 2.  Fatty liver: Found at time of recent  imaging 3.  Screening for colorectal cancer: Patient is is 83 and in need of a screening colonoscopy  Plan: 1.  Discussed that we will proceed with MRI/MRCP is recommended after abnormal ultrasound with dilated bile duct.  Depending on what this reveals would likely proceed with EGD and colonoscopy next for further evaluation and also because patient needs a colonoscopy for screening reasons. 2.  We will repeat LFTs today to compare. 3.  Discussed fatty liver, recommend a slow and steady weight loss of 1 to 2 pounds per week.  Also discussed avoiding hepatotoxic substances. 4.  Patient to follow in clinic per recommendations after imaging above.  She was assigned to Dr. Tarri Glenn today.  Desiree Newer, PA-C [facility] 03/26/2020, 8:41 AM  Cc: Tower, Wynelle Fanny, MD

## 2020-03-26 NOTE — Patient Instructions (Addendum)
If you are age 50 or older, your body mass index should be between 23-30. Your Body mass index is 30.29 kg/m. If this is out of the aforementioned range listed, please consider follow up with your Primary Care Provider.  If you are age 79 or younger, your body mass index should be between 19-25. Your Body mass index is 30.29 kg/m. If this is out of the aformentioned range listed, please consider follow up with your Primary Care Provider.   Your provider has requested that you go to the basement level for lab work before leaving today. Press "B" on the elevator. The lab is located at the first door on the left as you exit the elevator.  You have been scheduled for an MRI/MRCP at [facility] On 04/05/20  . Your appointment time is 8:00. Please arrive 8:30am  for registration purposes. Please make certain not to have anything to eat or drink 6 hours prior to your test. In addition, if you have any metal in your body, have a pacemaker or defibrillator, please be sure to let your ordering physician know. This test typically takes 45 minutes to 1 hour to complete. Should you need to reschedule, please call [phone] to do so.  Due to recent changes in healthcare laws, you may see the results of your imaging and laboratory studies on MyChart before your provider has had a chance to review them.  We understand that in some cases there may be results that are confusing or concerning to you. Not all laboratory results come back in the same time frame and the provider may be waiting for multiple results in order to interpret others.  Please give [place] 48 hours in order for your provider to thoroughly review all the results before contacting the office for clarification of your results.   Thank you for choosing me and [facility].  Ellouise Newer, PA-C

## 2020-03-27 LAB — HEPATIC FUNCTION PANEL
AG Ratio: 1.8 (calc) (ref 1.0–2.5)
ALT: 13 U/L (ref 6–29)
AST: 16 U/L (ref 10–35)
Albumin: 4.3 g/dL (ref 3.6–5.1)
Alkaline phosphatase (APISO): 46 U/L (ref 31–125)
Bilirubin, Direct: 0 mg/dL (ref 0.0–0.2)
Globulin: 2.4 g/dL (calc) (ref 1.9–3.7)
Indirect Bilirubin: 0.3 mg/dL (calc) (ref 0.2–1.2)
Total Bilirubin: 0.3 mg/dL (ref 0.2–1.2)
Total Protein: 6.7 g/dL (ref 6.1–8.1)

## 2020-03-27 NOTE — Progress Notes (Signed)
Reviewed.  Desiree Walters L. Desiree Helinski, MD, MPH  

## 2020-03-29 ENCOUNTER — Other Ambulatory Visit: Payer: Self-pay | Admitting: Family Medicine

## 2020-04-05 ENCOUNTER — Other Ambulatory Visit: Payer: Self-pay

## 2020-04-05 ENCOUNTER — Other Ambulatory Visit: Payer: Self-pay | Admitting: Physician Assistant

## 2020-04-05 ENCOUNTER — Ambulatory Visit (HOSPITAL_COMMUNITY)
Admission: RE | Admit: 2020-04-05 | Discharge: 2020-04-05 | Disposition: A | Discharge status: Home / Self Care | Payer: 59 | Source: Ambulatory Visit | Attending: Physician Assistant | Admitting: Physician Assistant

## 2020-04-05 DIAGNOSIS — R935 Abnormal findings on diagnostic imaging of other abdominal regions, including retroperitoneum: Secondary | ICD-10-CM

## 2020-04-05 DIAGNOSIS — R1011 Right upper quadrant pain: Secondary | ICD-10-CM | POA: Diagnosis present

## 2020-04-05 MED ORDER — GADOBUTROL 1 MMOL/ML IV SOLN
8.0000 mL | Freq: Once | INTRAVENOUS | Status: AC | PRN
  Administered 2020-04-05: 8 mL via INTRAVENOUS

## 2020-04-16 ENCOUNTER — Other Ambulatory Visit: Payer: Self-pay | Admitting: Family Medicine

## 2020-04-30 ENCOUNTER — Other Ambulatory Visit: Payer: Self-pay

## 2020-04-30 ENCOUNTER — Ambulatory Visit: Payer: 59 | Admitting: Gastroenterology

## 2020-04-30 ENCOUNTER — Ambulatory Visit
Admission: RE | Admit: 2020-04-30 | Discharge: 2020-04-30 | Disposition: A | Discharge status: Home / Self Care | Payer: 59 | Source: Ambulatory Visit | Attending: Family Medicine | Admitting: Family Medicine

## 2020-04-30 DIAGNOSIS — Z1231 Encounter for screening mammogram for malignant neoplasm of breast: Secondary | ICD-10-CM | POA: Insufficient documentation

## 2020-05-01 ENCOUNTER — Ambulatory Visit (AMBULATORY_SURGERY_CENTER): Payer: 59 | Admitting: *Deleted

## 2020-05-01 ENCOUNTER — Other Ambulatory Visit: Payer: Self-pay | Admitting: Gastroenterology

## 2020-05-01 ENCOUNTER — Other Ambulatory Visit: Payer: Self-pay

## 2020-05-01 VITALS — Ht 65.0 in | Wt 175.0 lb

## 2020-05-01 DIAGNOSIS — Z1211 Encounter for screening for malignant neoplasm of colon: Secondary | ICD-10-CM

## 2020-05-01 DIAGNOSIS — R1011 Right upper quadrant pain: Secondary | ICD-10-CM

## 2020-05-01 MED ORDER — PLENVU 140 G PO SOLR: 1.0000 | Freq: Once | ORAL | 0 refills | Status: AC

## 2020-05-01 NOTE — Progress Notes (Signed)
Patient's pre-visit was done today over the phone with the patient due to COVID-19 pandemic. Name,DOB and address verified. Insurance verified. Patient denies any allergies to Eggs and Soy. Patient denies any problems with anesthesia/sedation. Patient denies taking diet pills or blood thinners. Packet of Prep instructions mailed to patient including a copy of a consent form -pt is aware. Plenvu Coupon included. Patient understands to call us back with any questions or concerns. COVID-19 vaccines completed x3, per patient. Patient is aware of our care-partner policy and [facility]-18 safety protocol. EMMI education assigned to the patient for the procedure, sent to [place].

## 2020-05-03 NOTE — Telephone Encounter (Signed)
[facility] - pt was given Plenvu with a Coupon - Pharmacy states pt does not want to pay for a Plenvu prep- I asked if a coupon was run for Script-   Will be with coupon $60 per pharmacy- Pharmacy could not give me alternatives that [facility] would cover- Blair Hailey- she states she is okay to pay the $60 for the Plenvu

## 2020-05-03 NOTE — Telephone Encounter (Signed)
Vaughan Basta, Please help me get this patient a bowel prep. I received a strange refill request this morning. Thanks.

## 2020-05-08 ENCOUNTER — Encounter: Payer: Self-pay | Admitting: Gastroenterology

## 2020-05-10 ENCOUNTER — Encounter: Payer: Self-pay | Admitting: Gastroenterology

## 2020-05-10 ENCOUNTER — Ambulatory Visit (AMBULATORY_SURGERY_CENTER): Payer: 59 | Admitting: Gastroenterology

## 2020-05-10 ENCOUNTER — Other Ambulatory Visit: Payer: Self-pay

## 2020-05-10 VITALS — BP 128/64 | HR 59 | Temp 97.5°F | Resp 21 | Ht 65.0 in | Wt 175.0 lb

## 2020-05-10 DIAGNOSIS — K319 Disease of stomach and duodenum, unspecified: Secondary | ICD-10-CM

## 2020-05-10 DIAGNOSIS — D122 Benign neoplasm of ascending colon: Secondary | ICD-10-CM

## 2020-05-10 DIAGNOSIS — K259 Gastric ulcer, unspecified as acute or chronic, without hemorrhage or perforation: Secondary | ICD-10-CM | POA: Diagnosis not present

## 2020-05-10 DIAGNOSIS — K2270 Barrett's esophagus without dysplasia: Secondary | ICD-10-CM

## 2020-05-10 DIAGNOSIS — R1011 Right upper quadrant pain: Secondary | ICD-10-CM

## 2020-05-10 DIAGNOSIS — K2970 Gastritis, unspecified, without bleeding: Secondary | ICD-10-CM

## 2020-05-10 DIAGNOSIS — K5730 Diverticulosis of large intestine without perforation or abscess without bleeding: Secondary | ICD-10-CM | POA: Diagnosis not present

## 2020-05-10 DIAGNOSIS — K2950 Unspecified chronic gastritis without bleeding: Secondary | ICD-10-CM

## 2020-05-10 DIAGNOSIS — Z1211 Encounter for screening for malignant neoplasm of colon: Secondary | ICD-10-CM

## 2020-05-10 MED ORDER — SODIUM CHLORIDE 0.9 % IV SOLN: 500.0000 mL | Freq: Once | INTRAVENOUS | Status: DC

## 2020-05-10 NOTE — Op Note (Signed)
Desiree Walters Patient Name: Desiree Walters Procedure Date: 05/10/2020 3:24 PM MRN: [id number] Endoscopist: Thornton Park MD, MD Age: 50 Referring MD:  Date of Birth: October 18, 1970 Gender: Female Account #: [id number] Procedure:                Upper GI endoscopy Indications:              Abdominal pain in the right upper quadrant Medicines:                Monitored Anesthesia Care Procedure:                Pre-Anesthesia Assessment:                           - Prior to the procedure, a History and Physical                            was performed, and patient medications and                            allergies were reviewed. The patient's tolerance of                            previous anesthesia was also reviewed. The risks                            and benefits of the procedure and the sedation                            options and risks were discussed with the patient.                            All questions were answered, and informed consent                            was obtained. Prior Anticoagulants: The patient has                            taken no previous anticoagulant or antiplatelet                            agents. ASA Grade Assessment: III - A patient with                            severe systemic disease. After reviewing the risks                            and benefits, the patient was deemed in                            satisfactory condition to undergo the procedure.                           After obtaining informed consent, the endoscope was  passed under direct vision. Throughout the                            procedure, the patient's blood pressure, pulse, and                            oxygen saturations were monitored continuously. The                            Endoscope was introduced through the mouth, and                            advanced to the third part of duodenum. The upper                            GI  endoscopy was accomplished without difficulty.                            The patient tolerated the procedure well. Scope In: Scope Out: Findings:                 The Z-line was irregular and was found 38 cm from                            the incisors. Biopsies were taken with a cold                            forceps for histology. Estimated blood loss was                            minimal.                           A single small erosion with no bleeding and no                            stigmata of recent bleeding and mild, patchy                            erythema was found in the gastric body and in the                            gastric antrum. Biopsies were taken from the                            antrum, body, and fundus with a cold forceps for                            histology. Estimated blood loss was minimal.                           Diffuse mildly erythematous mucosa without active  bleeding and with no stigmata of bleeding was found                            in the duodenal bulb. Biopsies were taken with a                            cold forceps for histology. Estimated blood loss                            was minimal.                           Bile and some residual food was present in the                            stomach. Approximately 322mL was removed during the                            procedure. Complications:            No immediate complications. Estimated blood loss:                            Minimal. Estimated Blood Loss:     Estimated blood loss was minimal. Impression:               - Z-line irregular, 38 cm from the incisors.                            Biopsied.                           - Erosive gastropathy with no bleeding and no                            stigmata of recent bleeding. Biopsied.                           - Erythematous duodenopathy. Biopsied.                           - Bile and some residual food in  the stomach. Recommendation:           - Patient has a contact number available for                            emergencies. The signs and symptoms of potential                            delayed complications were discussed with the                            patient. Return to normal activities tomorrow.                            Written discharge instructions were provided to the  patient.                           - Resume previous diet.                           - Continue present medications.                           - No aspirin, ibuprofen, naproxen, or other                            non-steroidal anti-inflammatory drugs.                           - Await pathology results.                           - Proceed with colonoscopy as previously planned. Thornton Park MD, MD 05/10/2020 3:49:22 PM This report has been signed electronically.

## 2020-05-10 NOTE — Progress Notes (Signed)
Oral scope guide placed while awake. Pt confirms comfort.

## 2020-05-10 NOTE — Progress Notes (Signed)
Pt's states no medical or surgical changes since previsit or office visit.  CM iV.

## 2020-05-10 NOTE — Op Note (Signed)
[place] Patient Name: Desiree Walters Procedure Date: 05/10/2020 3:24 PM MRN: [id number] Endoscopist: Thornton Park MD, MD Age: 50 Referring MD:  Date of Birth: September 03, 1970 Gender: Female Account #: [id number] Procedure:                Colonoscopy Indications:              Screening for colorectal malignant neoplasm, This                            is the patient's first colonoscopy, Incidental                            abdominal pain noted Medicines:                Monitored Anesthesia Care Procedure:                Pre-Anesthesia Assessment:                           - Prior to the procedure, a History and Physical                            was performed, and patient medications and                            allergies were reviewed. The patient's tolerance of                            previous anesthesia was also reviewed. The risks                            and benefits of the procedure and the sedation                            options and risks were discussed with the patient.                            All questions were answered, and informed consent                            was obtained. Prior Anticoagulants: The patient has                            taken no previous anticoagulant or antiplatelet                            agents. ASA Grade Assessment: III - A patient with                            severe systemic disease. After reviewing the risks                            and benefits, the patient was deemed in  satisfactory condition to undergo the procedure.                           After obtaining informed consent, the colonoscope                            was passed under direct vision. Throughout the                            procedure, the patient's blood pressure, pulse, and                            oxygen saturations were monitored continuously. The                            Olympus PCF-H190DL ([id number])  Colonoscope was                            introduced through the anus and advanced to the 3                            cm into the ileum. The colonoscopy was performed                            without difficulty. The patient tolerated the                            procedure well. The quality of the bowel                            preparation was good. The terminal ileum, ileocecal                            valve, appendiceal orifice, and rectum were                            photographed. Scope In: 3:56:26 PM Scope Out: 4:13:16 PM Scope Withdrawal Time: 0 hours 13 minutes 39 seconds  Total Procedure Duration: 0 hours 16 minutes 50 seconds  Findings:                 The perianal and digital rectal examinations were                            normal.                           Multiple small and large-mouthed diverticula were                            found in the sigmoid colon and descending colon.                           An 8 mm polyp was found in the ascending colon. The  polyp was flat. The polyp was removed with a cold                            snare. Resection and retrieval were complete.                            Estimated blood loss was minimal.                           The exam was otherwise without abnormality on                            direct and retroflexion views. Complications:            No immediate complications. Estimated blood loss:                            Minimal. Estimated Blood Loss:     Estimated blood loss was minimal. Impression:               - Diverticulosis in the sigmoid colon and in the                            descending colon.                           - One 8 mm polyp in the ascending colon, removed                            with a cold snare. Resected and retrieved.                           - The examination was otherwise normal on direct                            and retroflexion views. Recommendation:            - Patient has a contact number available for                            emergencies. The signs and symptoms of potential                            delayed complications were discussed with the                            patient. Return to normal activities tomorrow.                            Written discharge instructions were provided to the                            patient.                           - Resume previous diet. High fiber diet recommended.                           -  Continue present medications.                           - Await pathology results.                           - Repeat colonoscopy date to be determined after                            pending pathology results are reviewed for                            surveillance.                           - Emerging evidence supports eating a diet of                            fruits, vegetables, grains, calcium, and yogurt                            while reducing red meat and alcohol may reduce the                            risk of colon cancer.                           - Thank you for allowing me to be involved in your                            colon cancer prevention. Thornton Park MD, MD 05/10/2020 4:21:30 PM This report has been signed electronically.

## 2020-05-10 NOTE — Patient Instructions (Signed)
YOU HAD AN ENDOSCOPIC PROCEDURE TODAY AT THE [facility]:   Refer to the procedure report that was given to you for any specific questions about what was found during the examination.  If the procedure report does not answer your questions, please call your gastroenterologist to clarify.  If you requested that your care partner not be given the details of your procedure findings, then the procedure report has been included in a sealed envelope for you to review at your convenience later.  YOU SHOULD EXPECT: Some feelings of bloating in the abdomen. Passage of more gas than usual.  Walking can help get rid of the air that was put into your GI tract during the procedure and reduce the bloating. If you had a lower endoscopy (such as a colonoscopy or flexible sigmoidoscopy) you may notice spotting of blood in your stool or on the toilet paper. If you underwent a bowel prep for your procedure, you may not have a normal bowel movement for a few days.  Please Note:  You might notice some irritation and congestion in your nose or some drainage.  This is from the oxygen used during your procedure.  There is no need for concern and it should clear up in a day or so.  SYMPTOMS TO REPORT IMMEDIATELY:   Following lower endoscopy (colonoscopy or flexible sigmoidoscopy):  Excessive amounts of blood in the stool  Significant tenderness or worsening of abdominal pains  Swelling of the abdomen that is new, acute  Fever of 100F or higher   Following upper endoscopy (EGD)  Vomiting of blood or coffee ground material  New chest pain or pain under the shoulder blades  Painful or persistently difficult swallowing  New shortness of breath  Fever of 100F or higher  Black, tarry-looking stools  For urgent or emergent issues, a gastroenterologist can be reached at any hour by calling [phone]. Do not use MyChart messaging for urgent concerns.    DIET:  We do recommend a small meal at first, but  then you may proceed to your regular diet.  Drink plenty of fluids but you should avoid alcoholic beverages for 24 hours.  ACTIVITY:  You should plan to take it easy for the rest of today and you should NOT DRIVE or use heavy machinery until tomorrow (because of the sedation medicines used during the test).    FOLLOW UP: Our staff will call the number listed on your records 48-72 hours following your procedure to check on you and address any questions or concerns that you may have regarding the information given to you following your procedure. If we do not reach you, we will leave a message.  We will attempt to reach you two times.  During this call, we will ask if you have developed any symptoms of COVID 19. If you develop any symptoms (ie: fever, flu-like symptoms, shortness of breath, cough etc.) before then, please call [phone].  If you test positive for Covid 19 in the 2 weeks post procedure, please call and report this information to us.    If any biopsies were taken you will be contacted by phone or by letter within the next 1-3 weeks.  Please call us at [phone] if you have not heard about the biopsies in 3 weeks.    SIGNATURES/CONFIDENTIALITY: You and/or your care partner have signed paperwork which will be entered into your electronic medical record.  These signatures attest to the fact that that the information above on   your After Visit Summary has been reviewed and is understood.  Full responsibility of the confidentiality of this discharge information lies with you and/or your care-partner. 

## 2020-05-10 NOTE — Progress Notes (Signed)
pt tolerated well. VSS. awake and to recovery. Report given to RN. Oral scope guide removed by pt. Awake and alert. Teeth unchanged.

## 2020-05-14 ENCOUNTER — Telehealth: Payer: Self-pay | Admitting: *Deleted

## 2020-05-14 NOTE — Telephone Encounter (Signed)
  Follow up Call-  Call back number 05/10/2020  Post procedure Call Back phone  # [phone]  Permission to leave phone message Yes  Some recent data might be hidden     Patient questions:  Do you have a fever, pain , or abdominal swelling? No. Pain Score  0 *  Have you tolerated food without any problems? Yes.    Have you been able to return to your normal activities? Yes.    Do you have any questions about your discharge instructions: Diet   No. Medications  No. Follow up visit  No.  Do you have questions or concerns about your Care? No.  Actions: * If pain score is 4 or above: No action needed, pain <4.

## 2020-05-23 ENCOUNTER — Encounter: Payer: Self-pay | Admitting: Gastroenterology

## 2020-05-23 ENCOUNTER — Other Ambulatory Visit: Payer: Self-pay

## 2020-05-23 MED ORDER — PANTOPRAZOLE SODIUM 40 MG PO TBEC: 40.0000 mg | DELAYED_RELEASE_TABLET | Freq: Every day | ORAL | 2 refills | Status: DC

## 2020-05-29 ENCOUNTER — Encounter: Payer: Self-pay | Admitting: Family Medicine

## 2020-05-30 ENCOUNTER — Telehealth (INDEPENDENT_AMBULATORY_CARE_PROVIDER_SITE_OTHER): Payer: 59 | Admitting: Family Medicine

## 2020-05-30 DIAGNOSIS — R21 Rash and other nonspecific skin eruption: Secondary | ICD-10-CM

## 2020-05-30 MED ORDER — VALACYCLOVIR HCL 1 G PO TABS: 1000.0000 mg | ORAL_TABLET | Freq: Three times a day (TID) | ORAL | 0 refills | Status: DC

## 2020-05-30 NOTE — Patient Instructions (Signed)
-  I sent the medication(s) we discussed to your pharmacy: Meds ordered this encounter  Medications  . valACYclovir (VALTREX) 1000 MG tablet    Sig: Take 1 tablet (1,000 mg total) by mouth 3 (three) times daily.    Dispense:  21 tablet    Refill:  0   Schedule follow-up visit with your primary care office in 1 week.  I hope you are feeling better soon!  Seek in person care promptly sooner if your symptoms worsen, new concerns arise or you are not improving with treatment.  It was nice to meet you today. I help [facility] out with telemedicine visits on Tuesdays and Thursdays and am available for visits on those days. If you have any concerns or questions following this visit please schedule a follow up visit with your Primary Care doctor or seek care at a local urgent care clinic to avoid delays in care.

## 2020-05-30 NOTE — Progress Notes (Signed)
Virtual Visit via Video Note  I connected with Desiree Walters  on 05/30/20 at 12:00 PM EDT by a video enabled telemedicine application and verified that I am speaking with the correct person using two identifiers.  Location patient: home, [place] Location provider:work or home office Persons participating in the virtual visit: patient, provider  I discussed the limitations of evaluation and management by telemedicine and the availability of in person appointments. The patient expressed understanding and agreed to proceed.   HPI:  Acute telemedicine visit for painful rash/she thinks is shingles : -Onset: yesterday -Symptoms include: painful papulovesicular rash on the R flank, wrapping around below the ribs -Denies: fevers, NVD, resp or urinary symptoms -had chicken pox -Has tried: on norco for other things -Pertinent medication allergies: cipro, levo, quinilones, varenicline   ROS: See pertinent positives and negatives per HPI.  Past Medical History:  Diagnosis Date  . Asthma   . Diabetes mellitus   . Fatty liver   . Fibromyalgia   . Hyperlipemia   . Hypersomnia    NPSG 08-25-07-1.2/hr  . Obesity   . OCD (obsessive compulsive disorder)   . Pernicious anemia   . Sleep apnea    uses CPAP  . Tobacco abuse     Past Surgical History:  Procedure Laterality Date  . ABDOMINAL HYSTERECTOMY    . AUGMENTATION MAMMAPLASTY Bilateral   . BREAST ENHANCEMENT SURGERY  2003  . FOOT SURGERY  2005; 2008  . IUD - Merina  2008  . KNEE SURGERY Left   . NASAL SEPTUM SURGERY       Current Outpatient Medications:  .  valACYclovir (VALTREX) 1000 MG tablet, Take 1 tablet (1,000 mg total) by mouth 3 (three) times daily., Disp: 21 tablet, Rfl: 0 .  buPROPion (WELLBUTRIN XL) 300 MG 24 hr tablet, Take 1 tablet (300 mg total) by mouth daily., Disp: 90 tablet, Rfl: 3 .  clonazePAM (KLONOPIN) 0.5 MG tablet, Take 1 tablet by mouth at bedtime., Disp: , Rfl:  .  cyanocobalamin (,VITAMIN B-12,) 1000 MCG/ML  injection, INJECT 1 ML INTO THE MUSCLE ONCE A MONTH, Disp: 3 mL, Rfl: 2 .  cyclobenzaprine (FLEXERIL) 10 MG tablet, Take 1 tablet by mouth 3 (three) times daily as needed., Disp: , Rfl:  .  hydrochlorothiazide (HYDRODIURIL) 25 MG tablet, TAKE 1 TABLET BY MOUTH EVERY DAY, Disp: 90 tablet, Rfl: 1 .  HYDROcodone-acetaminophen (NORCO) 10-325 MG per tablet, Take 1 tablet by mouth every 4 (four) hours as needed., Disp: , Rfl:  .  metoprolol succinate (TOPROL-XL) 25 MG 24 hr tablet, TAKE 1 TABLET BY MOUTH EVERY DAY, Disp: 90 tablet, Rfl: 1 .  pantoprazole (PROTONIX) 40 MG tablet, Take 1 tablet (40 mg total) by mouth daily., Disp: 30 tablet, Rfl: 2 .  rosuvastatin (CRESTOR) 10 MG tablet, TAKE 1 TABLET BY MOUTH EVERY DAY, Disp: 90 tablet, Rfl: 1 .  topiramate (TOPAMAX) 100 MG tablet, Take 1 tablet (100 mg total) by mouth at bedtime., Disp: 90 tablet, Rfl: 3  EXAM:  VITALS per patient if applicable:  GENERAL: alert, oriented, appears well and in no acute distress  HEENT: atraumatic, conjunttiva clear, no obvious abnormalities on inspection of external nose and ears  NECK: normal movements of the head and neck  LUNGS: on inspection no signs of respiratory distress, breathing rate appears normal, no obvious gross SOB, gasping or wheezing  SKIN: pt sent images to my chart - reviewed - quality not great but appears to have patchy erythematous lesions in R flank area  CV: no obvious cyanosis  MS: moves all visible extremities without noticeable abnormality  PSYCH/NEURO: pleasant and cooperative, no obvious depression or anxiety, speech and thought processing grossly intact  ASSESSMENT AND PLAN:  Discussed the following assessment and plan:  Rash  -we discussed possible serious and likely etiologies, options for evaluation and workup, limitations of telemedicine visit vs in person visit, treatment, treatment risks and precautions. Pt prefers to treat via telemedicine empirically rather than in  person at this moment.  Patient describes a painful papulovesicular rash in a dermatomal pattern on the right flank, which strongly suggest a diagnosis of shingles.  She opted to try empiric treatment with Valtrex for now.  She agrees to seek prompt in-person evaluation through primary care office or urgent care if worsening, new symptoms develop, spreading of the rash outside of the dermatomal region or is not improving. Scheduled follow up with PCP offered: Advised to follow-up visit with her primary care office in 1 week.  She agrees to schedule Advised to seek prompt in person care if worsening, new symptoms arise, or if is not improving with treatment. Discussed options for inperson care if PCP office not available. Did let this patient know that I only do telemedicine on Tuesdays and Thursdays for [facility]. Advised to schedule follow up visit with PCP or [facility] if any further questions or concerns to avoid delays in care.   I discussed the assessment and treatment plan with the patient. The patient was provided an opportunity to ask questions and all were answered. The patient agreed with the plan and demonstrated an understanding of the instructions.     Lucretia Kern, DO

## 2020-06-04 ENCOUNTER — Encounter: Payer: Self-pay | Admitting: Family Medicine

## 2020-06-04 NOTE — Telephone Encounter (Signed)
Will route to a provider in office for review since PCP is out of the office

## 2020-07-04 ENCOUNTER — Ambulatory Visit: Payer: 59 | Admitting: Gastroenterology

## 2020-07-07 ENCOUNTER — Emergency Department (HOSPITAL_BASED_OUTPATIENT_CLINIC_OR_DEPARTMENT_OTHER): Payer: 59

## 2020-07-07 ENCOUNTER — Encounter (HOSPITAL_BASED_OUTPATIENT_CLINIC_OR_DEPARTMENT_OTHER): Payer: Self-pay | Admitting: Urology

## 2020-07-07 ENCOUNTER — Emergency Department (HOSPITAL_BASED_OUTPATIENT_CLINIC_OR_DEPARTMENT_OTHER)
Admission: EM | Admit: 2020-07-07 | Discharge: 2020-07-07 | Disposition: A | Discharge status: Home / Self Care | Payer: 59 | Attending: Emergency Medicine | Admitting: Emergency Medicine

## 2020-07-07 ENCOUNTER — Other Ambulatory Visit: Payer: Self-pay

## 2020-07-07 DIAGNOSIS — F17210 Nicotine dependence, cigarettes, uncomplicated: Secondary | ICD-10-CM | POA: Diagnosis not present

## 2020-07-07 DIAGNOSIS — J45909 Unspecified asthma, uncomplicated: Secondary | ICD-10-CM | POA: Diagnosis not present

## 2020-07-07 DIAGNOSIS — Z79899 Other long term (current) drug therapy: Secondary | ICD-10-CM | POA: Diagnosis not present

## 2020-07-07 DIAGNOSIS — R1901 Right upper quadrant abdominal swelling, mass and lump: Secondary | ICD-10-CM | POA: Diagnosis present

## 2020-07-07 DIAGNOSIS — I10 Essential (primary) hypertension: Secondary | ICD-10-CM | POA: Insufficient documentation

## 2020-07-07 DIAGNOSIS — E119 Type 2 diabetes mellitus without complications: Secondary | ICD-10-CM | POA: Diagnosis not present

## 2020-07-07 LAB — URINALYSIS, ROUTINE W REFLEX MICROSCOPIC
Bilirubin Urine: NEGATIVE
Glucose, UA: NEGATIVE mg/dL
Ketones, ur: NEGATIVE mg/dL
Nitrite: NEGATIVE
Protein, ur: NEGATIVE mg/dL
Specific Gravity, Urine: 1.015 (ref 1.005–1.030)
pH: 6 (ref 5.0–8.0)

## 2020-07-07 LAB — COMPREHENSIVE METABOLIC PANEL
ALT: 25 U/L (ref 0–44)
AST: 28 U/L (ref 15–41)
Albumin: 4.2 g/dL (ref 3.5–5.0)
Alkaline Phosphatase: 52 U/L (ref 38–126)
Anion gap: 11 (ref 5–15)
BUN: 13 mg/dL (ref 6–20)
CO2: 24 mmol/L (ref 22–32)
Calcium: 9.4 mg/dL (ref 8.9–10.3)
Chloride: 102 mmol/L (ref 98–111)
Creatinine, Ser: 0.76 mg/dL (ref 0.44–1.00)
GFR, Estimated: >60 mL/min (ref >60)
Glucose, Bld: 121 mg/dL — ABNORMAL HIGH (ref 70–99)
Potassium: 2.9 mmol/L — ABNORMAL LOW (ref 3.5–5.1)
Sodium: 137 mmol/L (ref 135–145)
Total Bilirubin: 0.1 mg/dL — ABNORMAL LOW (ref 0.3–1.2)
Total Protein: 7 g/dL (ref 6.5–8.1)

## 2020-07-07 LAB — CBC
HCT: 45.5 % (ref 36.0–46.0)
Hemoglobin: 14.9 g/dL (ref 12.0–15.0)
MCH: 28.2 pg (ref 26.0–34.0)
MCHC: 32.7 g/dL (ref 30.0–36.0)
MCV: 86.2 fL (ref 80.0–100.0)
Platelets: 195 10*3/uL (ref 150–400)
RBC: 5.28 MIL/uL — ABNORMAL HIGH (ref 3.87–5.11)
RDW: 14.6 % (ref 11.5–15.5)
WBC: 8 10*3/uL (ref 4.0–10.5)
nRBC: 0 % (ref 0.0–0.2)

## 2020-07-07 LAB — LIPASE, BLOOD: Lipase: 40 U/L (ref 11–51)

## 2020-07-07 LAB — URINALYSIS, MICROSCOPIC (REFLEX)

## 2020-07-07 MED ORDER — IOHEXOL 300 MG/ML  SOLN
100.0000 mL | Freq: Once | INTRAMUSCULAR | Status: AC | PRN
  Administered 2020-07-07: 100 mL via INTRAVENOUS

## 2020-07-07 MED ORDER — POTASSIUM CHLORIDE CRYS ER 20 MEQ PO TBCR
40.0000 meq | EXTENDED_RELEASE_TABLET | Freq: Once | ORAL | Status: AC
  Administered 2020-07-07: 40 meq via ORAL
  Filled 2020-07-07: qty 2

## 2020-07-07 NOTE — ED Provider Notes (Signed)
[facility] EMERGENCY DEPARTMENT Provider Note   CSN: [id number] Arrival date & time: 07/07/20  1531     History Chief Complaint  Patient presents with  . Abdominal Pain    Desiree Walters is a 50 y.o. female.  50 year old female with past medical history below including OSA, type 2 diabetes mellitus, fatty liver, fibromyalgia, hyperlipidemia, chronic opiate use who presents with right upper quadrant swelling.  Patient has had many months of right upper quadrant pain for which she has been following with a gastroenterologist.  She has had an ultrasound followed by MRCP that was overall reassuring.  She has also had recent endoscopy and colonoscopy.  Today, she noticed a new bulge in her right upper quadrant that has not been present previously.  She did a lot of physical activity yesterday cleaning out her car for several hours but denies any sudden onset of pain in this area.  She reports history of appendectomy as a child and vaginal hysterectomy but denies any other abdominal surgeries.  She denies any vomiting or recent changes in bowel movements.  She has chronic issues with UTI for which she has upcoming appointment with a urologist.  She states she has done multiple courses of cephalosporins but it keeps coming back.  The history is provided by the patient and medical records.  Abdominal Pain      Past Medical History:  Diagnosis Date  . Asthma   . Diabetes mellitus   . Fatty liver   . Fibromyalgia   . Hyperlipemia   . Hypersomnia    NPSG 08-25-07-1.2/hr  . Obesity   . OCD (obsessive compulsive disorder)   . Pernicious anemia   . Sleep apnea    uses CPAP  . Tobacco abuse     Patient Active Problem List   Diagnosis Date Noted  . Right upper quadrant pain 03/06/2020  . Encounter for general adult medical examination with abnormal findings 03/05/2019  . Dysthymia 03/03/2019  . Low vitamin D level 03/03/2019  . Screening mammogram, encounter for 02/18/2018   . Migraine without aura 01/21/2017  . Menopausal syndrome (hot flashes) 05/09/2014  . Vitamin B12 deficiency 10/09/2009  . Hyperlipidemia 10/09/2009  . Prediabetes 10/09/2009  . RHINITIS 05/10/2008  . HYPERSOMNIA 09/26/2007  . Obstructive sleep apnea 08/20/2006  . TOBACCO ABUSE 08/04/2006  . Essential hypertension 08/04/2006  . OBSESSIVE-COMPULSIVE DISORDER 07/26/2006  . FIBROMYALGIA 07/26/2006    Past Surgical History:  Procedure Laterality Date  . ABDOMINAL HYSTERECTOMY    . AUGMENTATION MAMMAPLASTY Bilateral   . BREAST ENHANCEMENT SURGERY  2003  . FOOT SURGERY  2005; 2008  . IUD - Merina  2008  . KNEE SURGERY Left   . NASAL SEPTUM SURGERY       OB History   No obstetric history on file.     Family History  Problem Relation Age of Onset  . Hypertension Father   . Coronary artery disease Father   . Sleep apnea Father        with CPAP  . Heart disease Father        CAD  . Hyperlipidemia Father   . Hypertension Mother   . Hyperlipidemia Mother   . Hypertension Sister   . Hyperlipidemia Other        aunts/uncles  . Coronary artery disease Other        aunts/uncles  . Stroke Maternal Grandmother   . Multiple sclerosis Neg Hx   . Colon cancer Neg Hx   .  Pancreatic cancer Neg Hx   . Esophageal cancer Neg Hx   . Breast cancer Neg Hx   . Colon polyps Neg Hx   . Stomach cancer Neg Hx   . Rectal cancer Neg Hx     Social History   Tobacco Use  . Smoking status: Current Every Day Smoker    Packs/day: 1.00    Years: 20.00    Pack years: 20.00    Types: Cigarettes  . Smokeless tobacco: Never Used  Vaping Use  . Vaping Use: Never used  Substance Use Topics  . Alcohol use: Not Currently  . Drug use: No    Home Medications Prior to Admission medications   Medication Sig Start Date End Date Taking? Authorizing Provider  buPROPion (WELLBUTRIN XL) 300 MG 24 hr tablet Take 1 tablet (300 mg total) by mouth daily. 11/10/19   Tower, Wynelle Fanny, MD  clonazePAM  (KLONOPIN) 0.5 MG tablet Take 1 tablet by mouth at bedtime. 12/14/12   [provider]  cyanocobalamin (,VITAMIN B-12,) 1000 MCG/ML injection INJECT 1 ML INTO THE MUSCLE ONCE A MONTH 04/16/20   Tower, Wynelle Fanny, MD  cyclobenzaprine (FLEXERIL) 10 MG tablet Take 1 tablet by mouth 3 (three) times daily as needed. 06/25/15   [provider]  hydrochlorothiazide (HYDRODIURIL) 25 MG tablet TAKE 1 TABLET BY MOUTH EVERY DAY 02/28/20   Tower, Wynelle Fanny, MD  HYDROcodone-acetaminophen (NORCO) 10-325 MG per tablet Take 1 tablet by mouth every 4 (four) hours as needed.    [provider]  metoprolol succinate (TOPROL-XL) 25 MG 24 hr tablet TAKE 1 TABLET BY MOUTH EVERY DAY 02/28/20   Tower, Wynelle Fanny, MD  pantoprazole (PROTONIX) 40 MG tablet Take 1 tablet (40 mg total) by mouth daily. 05/23/20   Thornton Park, MD  rosuvastatin (CRESTOR) 10 MG tablet TAKE 1 TABLET BY MOUTH EVERY DAY 02/28/20   Tower, Wynelle Fanny, MD  topiramate (TOPAMAX) 100 MG tablet Take 1 tablet (100 mg total) by mouth at bedtime. 09/26/19   Lomax, Amy, NP  valACYclovir (VALTREX) 1000 MG tablet Take 1 tablet (1,000 mg total) by mouth 3 (three) times daily. 05/30/20   Lucretia Kern, DO    Allergies    Ciprofloxacin, Levofloxacin, Quinolones, and Varenicline tartrate  Review of Systems   Review of Systems  Gastrointestinal: Positive for abdominal pain.   All other systems reviewed and are negative except that which was mentioned in HPI  Physical Exam Updated Vital Signs BP (!) 149/99   Pulse 85   Temp 98.4 F (36.9 C) (Oral)   Resp 20   Ht 5\' 5"  (1.651 m)   Wt 79.4 kg   SpO2 99%   BMI 29.12 kg/m   Physical Exam Constitutional:      General: She is not in acute distress.    Appearance: Normal appearance.  HENT:     Head: Normocephalic and atraumatic.  Eyes:     Conjunctiva/sclera: Conjunctivae normal.  Cardiovascular:     Rate and Rhythm: Normal rate and regular rhythm.     Heart sounds: Normal heart  sounds. No murmur heard.   Pulmonary:     Effort: Pulmonary effort is normal.     Breath sounds: Normal breath sounds.  Abdominal:     General: Abdomen is flat. Bowel sounds are normal. There is no distension.     Palpations: Abdomen is soft. There is mass.     Tenderness: There is no abdominal tenderness.     Comments: Large bulge  in RUQ that is non-tender, no associated skin changes, no overlying scars  Musculoskeletal:     Right lower leg: No edema.     Left lower leg: No edema.  Skin:    General: Skin is warm and dry.  Neurological:     Mental Status: She is alert and oriented to person, place, and time.     Comments: Fluent speech, normal gait  Psychiatric:        Mood and Affect: Mood normal.        Behavior: Behavior normal.     ED Results / Procedures / Treatments   Labs (all labs ordered are listed, but only abnormal results are displayed) Labs Reviewed  COMPREHENSIVE METABOLIC PANEL - Abnormal; Notable for the following components:      Result Value   Potassium 2.9 (*)    Glucose, Bld 121 (*)    Total Bilirubin 0.1 (*)    All other components within normal limits  CBC - Abnormal; Notable for the following components:   RBC 5.28 (*)    All other components within normal limits  URINALYSIS, ROUTINE W REFLEX MICROSCOPIC - Abnormal; Notable for the following components:   Hgb urine dipstick SMALL (*)    Leukocytes,Ua SMALL (*)    All other components within normal limits  URINALYSIS, MICROSCOPIC (REFLEX) - Abnormal; Notable for the following components:   Bacteria, UA FEW (*)    All other components within normal limits  LIPASE, BLOOD    EKG EKG Interpretation  Date/Time:  Sunday July 07 2020 19:24:37 EDT Ventricular Rate:  77 PR Interval:  142 QRS Duration: 99 QT Interval:  367 QTC Calculation: 416 R Axis:   59 Text Interpretation: Sinus rhythm Minimal ST depression, lateral leads No significant change since last tracing Confirmed by Theotis Burrow  [phone]) on 07/07/2020 7:41:45 PM   Radiology CT Abdomen Pelvis W Contrast  Result Date: 07/07/2020 CLINICAL DATA:  Months of right upper quadrant pain with bulge EXAM: CT ABDOMEN AND PELVIS WITH CONTRAST TECHNIQUE: Multidetector CT imaging of the abdomen and pelvis was performed using the standard protocol following bolus administration of intravenous contrast. CONTRAST:  125mL OMNIPAQUE IOHEXOL 300 MG/ML  SOLN COMPARISON:  MRI abdomen April 05, 2020 FINDINGS: Lower chest: No acute abnormality. Normal size heart. No significant pericardial effusion/thickening. Partially visualized bilateral breast prostheses. Hepatobiliary: Diffuse hepatic steatosis. No suspicious hepatic lesion. Gallbladder is predominantly decompressed otherwise unremarkable. No biliary ductal dilation. Pancreas: Within normal limits, no evidence of acute inflammation. Previously visualized tiny cystic pancreatic lesions seen on prior MRI are not well visualized on today's examination. Spleen: Normal in size without focal abnormality. Adrenals/Urinary Tract: Bilateral adrenal glands are unremarkable. No hydronephrosis. No solid enhancing renal masses. Punctate nonobstructive 3 mm right lower pole renal stone. Urinary bladder is grossly unremarkable for degree of distension. Stomach/Bowel: Small hiatal hernia. Stomach is predominantly decompressed limiting evaluation. Hyperdense material layering stomach likely represents ingested contents. Normal positioning of the duodenum/ligament of [place]. No suspicious small bowel wall thickening or dilation. The appendix is not definitely visualized however there is no pericecal inflammation. Scattered colonic diverticulosis without findings of acute diverticulitis. Vascular/Lymphatic: Aortic atherosclerosis. No abdominal aortic aneurysm. No enlarged abdominal or pelvic lymph nodes. Reproductive: Unremarkable Other: No abdominopelvic ascites. Musculoskeletal: T11-T12 discogenic disease with endplate  sclerosis. IMPRESSION: 1. Small hiatal hernia. 2. Diffuse hepatic steatosis which in the setting of steatohepatitis can be a cause of abdominal pain. 3. Punctate nonobstructive right lower pole renal stone. 4. Scattered colonic diverticulosis without  findings of acute diverticulitis. 5. Aortic atherosclerosis. Aortic Atherosclerosis (ICD10-I70.0). Electronically Signed   By: Dahlia Bailiff MD   On: 07/07/2020 20:54    Procedures Procedures   Medications Ordered in ED Medications  potassium chloride SA (KLOR-CON) CR tablet 40 mEq (40 mEq Oral Given 07/07/20 1925)  iohexol (OMNIPAQUE) 300 MG/ML solution 100 mL (100 mLs Intravenous Contrast Given 07/07/20 2028)    ED Course  I have reviewed the triage vital signs and the nursing notes.  Pertinent labs & imaging results that were available during my care of the patient were reviewed by me and considered in my medical decision making (see chart for details).    MDM Rules/Calculators/A&P                         Well appearing on exam, reassuring VS. Pt w/ obvious bulge on exam, suspect possible hernia although it is unusual that she's never had this before and has no hx of large abd surgery in this area. Obtained CT abd/pelvis for better evaluation.  Labs show K 2.9, gave oral repletion.  CT shows no acute findings to explain the patient's symptoms.  She has hepatic steatosis which was present previously.  No hernias or other abnormalities in the right abdominal wall.  I have recommended that she follow-up with her PCP regarding her symptoms and have extensively reviewed return precautions.  Patient voiced understanding. Final Clinical Impression(s) / ED Diagnoses Final diagnoses:  Right upper quadrant abdominal swelling    Rx / DC Orders ED Discharge Orders    None       Mickael Mcnutt, Wenda Overland, MD 07/07/20 2156

## 2020-07-07 NOTE — ED Triage Notes (Signed)
Emergency Medicine Provider Triage Evaluation Note  Desiree Walters , a 50 y.o. female  was evaluated in triage.  Pt complains of RUQ swelling that started today. History of pain in the region. Has been seen by GI had MRI, MRCP, and U/S performed that were reassuring. Woke up today with new swelling. No pain in the region. Given her chronic issues was concerned and came to the ED. History of chronic UTI and is seeing urology in May. Recent shingles dx 4 weeks ago. No continued zoster rosh. No N/v/d, fevers.   Physical Exam  BP (!) 150/97 (BP Location: Left Arm)   Pulse (!) 102   Temp 98.4 F (36.9 C) (Oral)   Ht 5\' 5"  (1.651 m)   Wt 79.4 kg   SpO2 96%   BMI 29.12 kg/m  Gen:   Awake, no distress   HEENT:  Atraumatic  Resp:  Normal effort  Cardiac:  Normal rate  Abd:   Nondistended, nontender, large bulge to right abdomen that is reducible.  MSK:   Moves extremities without difficulty  Neuro:  Speech clear   Medical Decision Making  Medically screening exam initiated at 5:25 PM.  Appropriate orders placed.  Desiree Walters was informed that the remainder of the evaluation will be completed by another provider, this initial triage assessment does not replace that evaluation, and the importance of remaining in the ED until their evaluation is complete.  Clinical Impression  Hernia vs biliary cause vs msk vs idiopathic    Desiree Sexton, PA-C 07/07/20 1734

## 2020-07-07 NOTE — ED Triage Notes (Signed)
RUQ pain x months with swelling noticed today

## 2020-07-10 ENCOUNTER — Encounter: Payer: Self-pay | Admitting: Family Medicine

## 2020-07-12 ENCOUNTER — Other Ambulatory Visit: Payer: Self-pay

## 2020-07-12 ENCOUNTER — Encounter: Payer: Self-pay | Admitting: Family Medicine

## 2020-07-12 ENCOUNTER — Ambulatory Visit (INDEPENDENT_AMBULATORY_CARE_PROVIDER_SITE_OTHER): Payer: 59 | Admitting: Family Medicine

## 2020-07-12 VITALS — BP 122/78 | HR 87 | Temp 97.3°F | Ht 65.0 in | Wt 180.0 lb

## 2020-07-12 DIAGNOSIS — N390 Urinary tract infection, site not specified: Secondary | ICD-10-CM | POA: Diagnosis not present

## 2020-07-12 DIAGNOSIS — R300 Dysuria: Secondary | ICD-10-CM

## 2020-07-12 DIAGNOSIS — R350 Frequency of micturition: Secondary | ICD-10-CM | POA: Diagnosis not present

## 2020-07-12 DIAGNOSIS — E876 Hypokalemia: Secondary | ICD-10-CM | POA: Diagnosis not present

## 2020-07-12 DIAGNOSIS — R1011 Right upper quadrant pain: Secondary | ICD-10-CM

## 2020-07-12 DIAGNOSIS — Z113 Encounter for screening for infections with a predominantly sexual mode of transmission: Secondary | ICD-10-CM

## 2020-07-12 LAB — POC URINALSYSI DIPSTICK (AUTOMATED)
Bilirubin, UA: NEGATIVE
Glucose, UA: NEGATIVE
Ketones, UA: NEGATIVE
Nitrite, UA: NEGATIVE
Protein, UA: NEGATIVE
Spec Grav, UA: 1.02 (ref 1.010–1.025)
Urobilinogen, UA: 0.2 E.U./dL
pH, UA: 6 (ref 5.0–8.0)

## 2020-07-12 LAB — BASIC METABOLIC PANEL
BUN: 12 mg/dL (ref 6–23)
CO2: 25 mEq/L (ref 19–32)
Calcium: 9.3 mg/dL (ref 8.4–10.5)
Chloride: 107 mEq/L (ref 96–112)
Creatinine, Ser: 0.76 mg/dL (ref 0.40–1.20)
GFR: 92.01 mL/min (ref >60.00)
Glucose, Bld: 96 mg/dL (ref 70–99)
Potassium: 3.4 mEq/L — ABNORMAL LOW (ref 3.5–5.1)
Sodium: 141 mEq/L (ref 135–145)

## 2020-07-12 MED ORDER — CEPHALEXIN 500 MG PO CAPS: 500.0000 mg | ORAL_CAPSULE | Freq: Two times a day (BID) | ORAL | 0 refills | Status: DC

## 2020-07-12 NOTE — Telephone Encounter (Signed)
Pt returned call and advised she can make apt at 1230.  Added pt to schedule.

## 2020-07-12 NOTE — Assessment & Plan Note (Signed)
Per pt klebsiella resistant to sulfa and macrobid She is allergic to quinolones Pending cx here  ua with sm leuk  Symptomatic Px keflex 500 bid for 7d  Has urology appt soon as well Enc good fluid intake and not holding urine overly long

## 2020-07-12 NOTE — Assessment & Plan Note (Signed)
Pt thinks she may have hsv (outbreak in past, no lesions now)  Ordered serum std screen incl hep c, rpr, HSV, HIV  Will return for urine gc/chlam later if she wants them  Enc safe sexual practices

## 2020-07-12 NOTE — Assessment & Plan Note (Signed)
2.9 in ER-supplemented On hctz Check bmet today

## 2020-07-12 NOTE — Patient Instructions (Addendum)
Today Serum STD screen  If you want to return for gc/chlamydia testing let us know   Take keflex 500 mg as directed   Follow up with urology   Drink lots of water   We will notify you with results of the urine culture

## 2020-07-12 NOTE — Progress Notes (Signed)
Subjective:    Patient ID: Desiree Walters, female    DOB: 25-Aug-1970, 50 y.o.   MRN: [id number]  This visit occurred during the SARS-CoV-2 public health emergency.  Safety protocols were in place, including screening questions prior to the visit, additional usage of staff PPE, and extensive cleaning of exam room while observing appropriate contact time as indicated for disinfecting solutions.    HPI Pt presents for urinary symptoms including dysuria and urinary frequency  Wt Readings from Last 3 Encounters:  07/12/20 180 lb (81.6 kg)  07/07/20 175 lb (79.4 kg)  05/10/20 175 lb (79.4 kg)   29.95 kg/m  Chronic uti on off for 2 y  Same klebsiella pneumo Same sensitivities Resistant to sufa and macrobid and ampicillin  No quinolones due to allergy   Usually treated by cephalosporin (7 days)   (symptoms improve 2 days)  Last cx date march 30   Feels like she never gets rid of it  R flank pain  Burning after urination  Funny smell to urine  She takes doxycycline for another problem   Frequency and urgency  Not a lot of bathroom breaks at work  Has to limit fluids   No visible blood in urine   Has vaginal lesions occ -wonders if HSV  Same partner for 3 y   (his ex wife had hep C) -he tests negative   Interested in STD screening    Urology appt is 9th   She did have an ER visit for RUQ bulge recently (in setting of ruq pain acute on chronic with GI w/u) This was A/P Well appearing on exam, reassuring VS. Pt w/ obvious bulge on exam, suspect possible hernia although it is unusual that she's never had this before and has no hx of large abd surgery in this area. Obtained CT abd/pelvis for better evaluation.  Labs show K 2.9, gave oral repletion.  CT shows no acute findings to explain the patient's symptoms.  She has hepatic steatosis which was present previously.  No hernias or other abnormalities in the right abdominal wall.  I have recommended that she follow-up with  her PCP regarding her symptoms and have extensively reviewed return precautions.  Patient voiced understanding.  Lab Results  Component Value Date   CREATININE 0.76 07/07/2020   BUN 13 07/07/2020   NA 137 07/07/2020   K 2.9 (L) 07/07/2020   CL 102 07/07/2020   CO2 24 07/07/2020   ua at that time : Results for Desiree, Walters (MRN [id number]) as of 07/12/2020 12:41  Ref. Range 07/07/2020 18:28  URINALYSIS, ROUTINE W REFLEX MICROSCOPIC Unknown Rpt (A)  Appearance Latest Ref Range: CLEAR  CLEAR  Bilirubin Urine Latest Ref Range: NEGATIVE  NEGATIVE  Color, Urine Latest Ref Range: YELLOW  YELLOW  Glucose, UA Latest Ref Range: NEGATIVE mg/dL NEGATIVE  Hgb urine dipstick Latest Ref Range: NEGATIVE  SMALL (A)  Ketones, ur Latest Ref Range: NEGATIVE mg/dL NEGATIVE  Leukocytes,Ua Latest Ref Range: NEGATIVE  SMALL (A)  Nitrite Latest Ref Range: NEGATIVE  NEGATIVE  pH Latest Ref Range: 5.0 - 8.0  6.0  Protein Latest Ref Range: NEGATIVE mg/dL NEGATIVE  Specific Gravity, Urine Latest Ref Range: 1.005 - 1.030  1.015  Bacteria, UA Latest Ref Range: NONE SEEN  FEW (A)  RBC / HPF Latest Ref Range: 0 - 5 RBC/hpf 0-5  Squamous Epithelial / LPF Latest Ref Range: 0 - 5  0-5  WBC, UA Latest Ref Range: 0 - 5 WBC/hpf  0-5    ua today  Results for orders placed or performed in visit on 07/12/20  POCT Urinalysis Dipstick (Automated)  Result Value Ref Range   Color, UA yellow    Clarity, UA clear    Glucose, UA Negative Negative   Bilirubin, UA neg    Ketones, UA neg    Spec Grav, UA 1.020 1.010 - 1.025   Blood, UA small    pH, UA 6.0 5.0 - 8.0   Protein, UA Negative Negative   Urobilinogen, UA 0.2 0.2 or 1.0 E.U./dL   Nitrite, UA neg    Leukocytes, UA Small (1+) (A) Negative    Patient Active Problem List   Diagnosis Date Noted  . Recurrent UTI 07/12/2020  . Hypokalemia 07/12/2020  . Screen for STD (sexually transmitted disease) 07/12/2020  . Right upper quadrant pain 03/06/2020  .  Encounter for general adult medical examination with abnormal findings 03/05/2019  . Dysthymia 03/03/2019  . Low vitamin D level 03/03/2019  . Screening mammogram, encounter for 02/18/2018  . Migraine without aura 01/21/2017  . Menopausal syndrome (hot flashes) 05/09/2014  . Vitamin B12 deficiency 10/09/2009  . Hyperlipidemia 10/09/2009  . Prediabetes 10/09/2009  . RHINITIS 05/10/2008  . HYPERSOMNIA 09/26/2007  . Obstructive sleep apnea 08/20/2006  . TOBACCO ABUSE 08/04/2006  . Essential hypertension 08/04/2006  . OBSESSIVE-COMPULSIVE DISORDER 07/26/2006  . FIBROMYALGIA 07/26/2006   Past Medical History:  Diagnosis Date  . Asthma   . Diabetes mellitus   . Fatty liver   . Fibromyalgia   . Hyperlipemia   . Hypersomnia    NPSG 08-25-07-1.2/hr  . Obesity   . OCD (obsessive compulsive disorder)   . Pernicious anemia   . Sleep apnea    uses CPAP  . Tobacco abuse    Past Surgical History:  Procedure Laterality Date  . ABDOMINAL HYSTERECTOMY    . AUGMENTATION MAMMAPLASTY Bilateral   . BREAST ENHANCEMENT SURGERY  2003  . FOOT SURGERY  2005; 2008  . IUD - Merina  2008  . KNEE SURGERY Left   . NASAL SEPTUM SURGERY     Social History   Tobacco Use  . Smoking status: Current Every Day Smoker    Packs/day: 1.00    Years: 20.00    Pack years: 20.00    Types: Cigarettes  . Smokeless tobacco: Never Used  Vaping Use  . Vaping Use: Never used  Substance Use Topics  . Alcohol use: Not Currently  . Drug use: No   Family History  Problem Relation Age of Onset  . Hypertension Father   . Coronary artery disease Father   . Sleep apnea Father        with CPAP  . Heart disease Father        CAD  . Hyperlipidemia Father   . Hypertension Mother   . Hyperlipidemia Mother   . Hypertension Sister   . Hyperlipidemia Other        aunts/uncles  . Coronary artery disease Other        aunts/uncles  . Stroke Maternal Grandmother   . Multiple sclerosis Neg Hx   . Colon cancer Neg  Hx   . Pancreatic cancer Neg Hx   . Esophageal cancer Neg Hx   . Breast cancer Neg Hx   . Colon polyps Neg Hx   . Stomach cancer Neg Hx   . Rectal cancer Neg Hx    Allergies  Allergen Reactions  . Ciprofloxacin     REACTION: reaction  not known  . Levofloxacin     REACTION: reaction not known  . Quinolones     Other reaction(s): Unknown  . Varenicline Tartrate     REACTION: headache   Current Outpatient Medications on File Prior to Visit  Medication Sig Dispense Refill  . clonazePAM (KLONOPIN) 0.5 MG tablet Take 1 tablet by mouth at bedtime.    . cyanocobalamin (,VITAMIN B-12,) 1000 MCG/ML injection INJECT 1 ML INTO THE MUSCLE ONCE A MONTH 3 mL 2  . cyclobenzaprine (FLEXERIL) 10 MG tablet Take 1 tablet by mouth 3 (three) times daily as needed.    . hydrochlorothiazide (HYDRODIURIL) 25 MG tablet TAKE 1 TABLET BY MOUTH EVERY DAY 90 tablet 1  . HYDROcodone-acetaminophen (NORCO) 10-325 MG per tablet Take 1 tablet by mouth every 4 (four) hours as needed.    . metoprolol succinate (TOPROL-XL) 25 MG 24 hr tablet TAKE 1 TABLET BY MOUTH EVERY DAY 90 tablet 1  . pantoprazole (PROTONIX) 40 MG tablet Take 1 tablet (40 mg total) by mouth daily. 30 tablet 2  . rosuvastatin (CRESTOR) 10 MG tablet TAKE 1 TABLET BY MOUTH EVERY DAY 90 tablet 1  . topiramate (TOPAMAX) 100 MG tablet Take 1 tablet (100 mg total) by mouth at bedtime. 90 tablet 3  . valACYclovir (VALTREX) 1000 MG tablet Take 1 tablet (1,000 mg total) by mouth 3 (three) times daily. 21 tablet 0   No current facility-administered medications on file prior to visit.     Review of Systems  Constitutional: Negative for activity change, appetite change, fatigue, fever and unexpected weight change.  HENT: Negative for congestion, ear pain, rhinorrhea, sinus pressure and sore throat.   Eyes: Negative for pain, redness and visual disturbance.  Respiratory: Negative for cough, shortness of breath and wheezing.   Cardiovascular: Negative for  chest pain and palpitations.  Gastrointestinal: Negative for abdominal pain, blood in stool, constipation and diarrhea.  Endocrine: Negative for polydipsia and polyuria.  Genitourinary: Positive for dysuria, frequency, genital sores and urgency. Negative for decreased urine volume and hematuria.       Flank pain on R  Musculoskeletal: Negative for arthralgias, back pain and myalgias.  Skin: Negative for pallor and rash.  Allergic/Immunologic: Negative for environmental allergies.  Neurological: Negative for dizziness, syncope and headaches.  Hematological: Negative for adenopathy. Does not bruise/bleed easily.  Psychiatric/Behavioral: Negative for decreased concentration and dysphoric mood. The patient is not nervous/anxious.        Stressors        Objective:   Physical Exam Constitutional:      General: She is not in acute distress.    Appearance: Normal appearance. She is well-developed. She is obese. She is not ill-appearing.  HENT:     Head: Normocephalic and atraumatic.  Eyes:     Conjunctiva/sclera: Conjunctivae normal.     Pupils: Pupils are equal, round, and reactive to light.  Cardiovascular:     Rate and Rhythm: Normal rate and regular rhythm.     Heart sounds: Normal heart sounds.  Pulmonary:     Effort: Pulmonary effort is normal.     Breath sounds: Normal breath sounds.  Abdominal:     General: Abdomen is flat. Bowel sounds are normal. There is no distension.     Palpations: Abdomen is soft. There is no hepatomegaly, splenomegaly, mass or pulsatile mass.     Tenderness: There is abdominal tenderness in the right upper quadrant. There is no guarding or rebound.     Comments: No cva  tenderness No suprapubic tenderness   Baseline tender RUQ Also protuberant abdomen RUQ  Musculoskeletal:     Cervical back: Normal range of motion and neck supple.  Lymphadenopathy:     Cervical: No cervical adenopathy.  Skin:    Findings: No rash.  Neurological:     Mental  Status: She is alert.           Assessment & Plan:   Problem List Items Addressed This Visit      Genitourinary   Recurrent UTI    Per pt klebsiella resistant to sulfa and macrobid She is allergic to quinolones Pending cx here  ua with sm leuk  Symptomatic Px keflex 500 bid for 7d  Has urology appt soon as well Enc good fluid intake and not holding urine overly long       Relevant Medications   cephALEXin (KEFLEX) 500 MG capsule     Other   Right upper quadrant pain    Chronic  Seeing GI Has had MRI and CT  Some bulge noted but no hernia on CT For GI f/u       Hypokalemia    2.9 in ER-supplemented On hctz Check bmet today      Relevant Orders   Basic metabolic panel   Screen for STD (sexually transmitted disease)    Pt thinks she may have hsv (outbreak in past, no lesions now)  Ordered serum std screen incl hep c, rpr, HSV, HIV  Will return for urine gc/chlam later if she wants them  Enc safe sexual practices       Relevant Orders   HSV 1/2 Ab (IgM), IFA w/rflx Titer   HIV Antibody (routine testing w rflx)   Hepatitis C antibody   RPR    Other Visit Diagnoses    Dysuria    -  Primary   Relevant Orders   POCT Urinalysis Dipstick (Automated) (Completed)   Urine Culture   Urinary frequency       Relevant Orders   POCT Urinalysis Dipstick (Automated) (Completed)   Urine Culture

## 2020-07-12 NOTE — Telephone Encounter (Signed)
LVM for pt to call to schedule apt

## 2020-07-12 NOTE — Assessment & Plan Note (Signed)
Chronic  Seeing GI Has had MRI and CT  Some bulge noted but no hernia on CT For GI f/u

## 2020-07-15 ENCOUNTER — Ambulatory Visit: Payer: Self-pay | Admitting: Physician Assistant

## 2020-07-18 LAB — URINE CULTURE
MICRO NUMBER:: 11831703
SPECIMEN QUALITY:: ADEQUATE

## 2020-07-18 LAB — HEPATITIS C ANTIBODY
Hepatitis C Ab: NONREACTIVE
SIGNAL TO CUT-OFF: 0.01 (ref <1.00)

## 2020-07-18 LAB — HSV 1/2 AB (IGM), IFA W/RFLX TITER
HSV 1 IgM Screen: NEGATIVE
HSV 2 IgM Screen: NEGATIVE

## 2020-07-18 LAB — HIV ANTIBODY (ROUTINE TESTING W REFLEX): HIV 1&2 Ab, 4th Generation: NONREACTIVE

## 2020-07-18 LAB — RPR: RPR Ser Ql: NONREACTIVE

## 2020-08-14 ENCOUNTER — Other Ambulatory Visit: Payer: Self-pay | Admitting: Gastroenterology

## 2020-09-02 ENCOUNTER — Other Ambulatory Visit: Payer: Self-pay | Admitting: Family Medicine

## 2020-09-05 ENCOUNTER — Other Ambulatory Visit: Payer: Self-pay | Admitting: Family Medicine

## 2020-09-06 ENCOUNTER — Other Ambulatory Visit: Payer: Self-pay | Admitting: Family Medicine

## 2020-09-18 ENCOUNTER — Other Ambulatory Visit: Payer: Self-pay | Admitting: Family Medicine

## 2020-12-18 ENCOUNTER — Other Ambulatory Visit: Payer: Self-pay | Admitting: Family Medicine

## 2020-12-20 NOTE — Telephone Encounter (Signed)
Med said it was d/c at UTI appt in April but pt is still taking med and PCP filled last. Med refilled since pt is still taking it

## 2021-02-12 ENCOUNTER — Other Ambulatory Visit: Payer: Self-pay | Admitting: Family Medicine

## 2021-02-13 NOTE — Telephone Encounter (Signed)
Med refilled.

## 2021-02-13 NOTE — Telephone Encounter (Signed)
Called patient and got her scheduled for 1/10 @10 

## 2021-02-13 NOTE — Telephone Encounter (Signed)
Pt is due for her CPE on or after 03/06/21, please schedule appt then route back to me to refill

## 2021-03-10 ENCOUNTER — Other Ambulatory Visit: Payer: Self-pay | Admitting: Family Medicine

## 2021-03-13 ENCOUNTER — Other Ambulatory Visit: Payer: Self-pay | Admitting: Family Medicine

## 2021-03-14 ENCOUNTER — Other Ambulatory Visit: Payer: Self-pay | Admitting: Family Medicine

## 2021-03-25 ENCOUNTER — Encounter: Payer: Self-pay | Admitting: Family Medicine

## 2021-03-25 ENCOUNTER — Ambulatory Visit (INDEPENDENT_AMBULATORY_CARE_PROVIDER_SITE_OTHER): Payer: 59 | Admitting: Family Medicine

## 2021-03-25 ENCOUNTER — Other Ambulatory Visit: Payer: Self-pay

## 2021-03-25 VITALS — BP 126/82 | HR 83 | Temp 97.3°F | Resp 12 | Ht 64.0 in | Wt 175.1 lb

## 2021-03-25 DIAGNOSIS — I10 Essential (primary) hypertension: Secondary | ICD-10-CM | POA: Diagnosis not present

## 2021-03-25 DIAGNOSIS — F341 Dysthymic disorder: Secondary | ICD-10-CM

## 2021-03-25 DIAGNOSIS — F17200 Nicotine dependence, unspecified, uncomplicated: Secondary | ICD-10-CM | POA: Diagnosis not present

## 2021-03-25 DIAGNOSIS — E538 Deficiency of other specified B group vitamins: Secondary | ICD-10-CM

## 2021-03-25 DIAGNOSIS — R7989 Other specified abnormal findings of blood chemistry: Secondary | ICD-10-CM

## 2021-03-25 DIAGNOSIS — Z23 Encounter for immunization: Secondary | ICD-10-CM

## 2021-03-25 DIAGNOSIS — E7800 Pure hypercholesterolemia, unspecified: Secondary | ICD-10-CM

## 2021-03-25 DIAGNOSIS — R7303 Prediabetes: Secondary | ICD-10-CM

## 2021-03-25 DIAGNOSIS — Z0000 Encounter for general adult medical examination without abnormal findings: Secondary | ICD-10-CM | POA: Diagnosis not present

## 2021-03-25 DIAGNOSIS — Z1231 Encounter for screening mammogram for malignant neoplasm of breast: Secondary | ICD-10-CM

## 2021-03-25 MED ORDER — ROSUVASTATIN CALCIUM 10 MG PO TABS: 10.0000 mg | ORAL_TABLET | Freq: Every day | ORAL | 3 refills | Status: DC

## 2021-03-25 MED ORDER — METOPROLOL SUCCINATE ER 25 MG PO TB24: 25.0000 mg | ORAL_TABLET | Freq: Every day | ORAL | 3 refills | Status: DC

## 2021-03-25 MED ORDER — HYDROCHLOROTHIAZIDE 25 MG PO TABS: 25.0000 mg | ORAL_TABLET | Freq: Every day | ORAL | 3 refills | Status: DC

## 2021-03-25 MED ORDER — BUPROPION HCL ER (XL) 300 MG PO TB24: 300.0000 mg | ORAL_TABLET | Freq: Every day | ORAL | 3 refills | Status: DC

## 2021-03-25 NOTE — Assessment & Plan Note (Signed)
Disc goals for lipids and reasons to control them Rev last labs with pt Rev low sat fat diet in detail Labs ordered  Taking crestor 10 mg daily  Watching triglycerides for accutane use

## 2021-03-25 NOTE — Assessment & Plan Note (Signed)
Reviewed health habits including diet and exercise and skin cancer prevention Reviewed appropriate screening tests for age  Also reviewed health mt list, fam hx and immunization status , as well as social and family history   See HPI Labs ordered  Flu shot given today  Plans to check on coverage of shingrix  Mammogram ordered Colonoscopy utd  Commended re: cutting down smoking and enc to quit

## 2021-03-25 NOTE — Assessment & Plan Note (Signed)
Taking shots monthly  Level drawn today

## 2021-03-25 NOTE — Assessment & Plan Note (Signed)
Rev PHQ Good and bad days and overall doing well Plan to continue wellbutrin  Encouraged good self care

## 2021-03-25 NOTE — Assessment & Plan Note (Signed)
She has stopped smoking at work (uses nicotine pouches)  Not ready to quit entirely but cut down by 1/2  Disc in detail risks of smoking and possible outcomes including copd, vascular/ heart disease, cancer , respiratory and sinus infections  Pt voices understanding

## 2021-03-25 NOTE — Progress Notes (Signed)
Subjective:    Patient ID: Desiree Walters, female    DOB: 05-Jul-1970, 51 y.o.   MRN: [id number]  This visit occurred during the SARS-CoV-2 public health emergency.  Safety protocols were in place, including screening questions prior to the visit, additional usage of staff PPE, and extensive cleaning of exam room while observing appropriate contact time as indicated for disinfecting solutions.   HPI Here for health maintenance exam and to review chronic medical problems    Wt Readings from Last 3 Encounters:  03/25/21 175 lb 2 oz (79.4 kg)  07/12/20 180 lb (81.6 kg)  07/07/20 175 lb (79.4 kg)   30.06 kg/m Lost to 171 and gained 4 back   Doing pretty well overall  Working all the time   Taking care of herself  Has a treadmill  Uses [employer] for training  Has 2 dogs/has to walk them   Covid immunized Flu shot - today   Zoster status -interested in shingrix if covered /had shingles in march  Tdap 10/18   Mammogram 04/2020, went to [place]  Self breast exam - no lumps   Colonoscopy 04/2020 with 7 y recall   Smoking status : using some nicotine pouches so she does not smoke at work  Has cut down by 1/2   No plan to entirely quit   Had covid 3-4 weeks ago, wheezed a bit    HTN bp is stable today  No cp or palpitations or headaches or edema  No side effects to medicines  BP Readings from Last 3 Encounters:  03/25/21 126/82  07/12/20 122/78  07/07/20 (!) 149/99     Hctz 25 mg  Metoprolol xl 50 mg daily   Pulse Readings from Last 3 Encounters:  03/25/21 83  07/12/20 87  07/07/20 85     Lab Results  Component Value Date   CREATININE 0.76 07/12/2020   BUN 12 07/12/2020   NA 141 07/12/2020   K 3.4 (L) 07/12/2020   CL 107 07/12/2020   CO2 25 07/12/2020   Lab Results  Component Value Date   ALT 25 07/07/2020   AST 28 07/07/2020   ALKPHOS 52 07/07/2020   BILITOT 0.1 (L) 07/07/2020   Lab Results  Component Value Date   WBC 8.0 07/07/2020   HGB 14.9  07/07/2020   HCT 45.5 07/07/2020   MCV 86.2 07/07/2020   PLT 195 07/07/2020     Vit B12 def Lab Results  Component Value Date   VITAMINB12 >2,000 (H) 03/06/2020  Takes B12 injections monthly  H/o low D level  Not taking any    Hyperlipidemia  Lab Results  Component Value Date   CHOL 161 03/06/2020   HDL 48 (L) 03/06/2020   LDLCALC 87 03/06/2020   LDLDIRECT 159.0 02/18/2018   TRIG 162 (H) 03/06/2020   CHOLHDL 3.4 03/06/2020   Takes crestor 10 mg daily  Takes accutane- for acne and it works great  Has dry skin and lips    Diet has been pretty good   Prediabetes Lab Results  Component Value Date   HGBA1C 6.0 (H) 03/06/2020   Some sweets , eats cake   Takes wellbutrin for dysthymia  Depression screen [facility] 2/9 03/25/2021 03/06/2020 03/03/2019 02/18/2018 12/21/2016  Decreased Interest 0 1 0 2 1  Down, Depressed, Hopeless 0 0 2 0 0  PHQ - 2 Score 0 1 2 2 1   Altered sleeping 2 1 1  0 1  Tired, decreased energy 1 2 0 2 2  Change in appetite 1 2 0 3 0  Feeling bad or failure about yourself  0 0 0 1 0  Trouble concentrating 2 3 0 3 3  Moving slowly or fidgety/restless 0 0 0 2 3  Suicidal thoughts 0 0 0 0 0  PHQ-9 Score 6 9 3 13 10   Difficult doing work/chores Not difficult at all Not difficult at all Not difficult at all - -     Patient Active Problem List   Diagnosis Date Noted   Routine general medical examination at a health care facility 03/25/2021   Recurrent UTI 07/12/2020   Hypokalemia 07/12/2020   Screen for STD (sexually transmitted disease) 07/12/2020   Right upper quadrant pain 03/06/2020   Encounter for general adult medical examination with abnormal findings 03/05/2019   Dysthymia 03/03/2019   Low vitamin D level 03/03/2019   Screening mammogram, encounter for 02/18/2018   Migraine without aura 01/21/2017   Menopausal syndrome (hot flashes) 05/09/2014   Encounter for screening mammogram for breast cancer 05/09/2014   Vitamin B12 deficiency  10/09/2009   Hyperlipidemia 10/09/2009   Prediabetes 10/09/2009   RHINITIS 05/10/2008   HYPERSOMNIA 09/26/2007   Obstructive sleep apnea 08/20/2006   TOBACCO ABUSE 08/04/2006   Essential hypertension 08/04/2006   OBSESSIVE-COMPULSIVE DISORDER 07/26/2006   FIBROMYALGIA 07/26/2006   Past Medical History:  Diagnosis Date   Asthma    Diabetes mellitus    Fatty liver    Fibromyalgia    Hyperlipemia    Hypersomnia    NPSG 08-25-07-1.2/hr   Obesity    OCD (obsessive compulsive disorder)    Pernicious anemia    Sleep apnea    uses CPAP   Tobacco abuse    Past Surgical History:  Procedure Laterality Date   ABDOMINAL HYSTERECTOMY     AUGMENTATION MAMMAPLASTY Bilateral    BREAST ENHANCEMENT SURGERY  2003   FOOT SURGERY  2005; 2008   IUD - Merina  2008   KNEE SURGERY Left    NASAL SEPTUM SURGERY     Social History   Tobacco Use   Smoking status: Every Day    Packs/day: 1.00    Years: 20.00    Pack years: 20.00    Types: Cigarettes   Smokeless tobacco: Never  Vaping Use   Vaping Use: Never used  Substance Use Topics   Alcohol use: Not Currently   Drug use: No   Family History  Problem Relation Age of Onset   Hypertension Father    Coronary artery disease Father    Sleep apnea Father        with CPAP   Heart disease Father        CAD   Hyperlipidemia Father    Hypertension Mother    Hyperlipidemia Mother    Hypertension Sister    Hyperlipidemia Other        aunts/uncles   Coronary artery disease Other        aunts/uncles   Stroke Maternal Grandmother    Multiple sclerosis Neg Hx    Colon cancer Neg Hx    Pancreatic cancer Neg Hx    Esophageal cancer Neg Hx    Breast cancer Neg Hx    Colon polyps Neg Hx    Stomach cancer Neg Hx    Rectal cancer Neg Hx    Allergies  Allergen Reactions   Ciprofloxacin     REACTION: reaction not known   Levofloxacin     REACTION: reaction not known   Quinolones  Other reaction(s): Unknown   Varenicline Tartrate      REACTION: headache   Current Outpatient Medications on File Prior to Visit  Medication Sig Dispense Refill   clonazePAM (KLONOPIN) 0.5 MG tablet Take 1 tablet by mouth at bedtime.     cyanocobalamin (,VITAMIN B-12,) 1000 MCG/ML injection INJECT 1 ML INTO THE MUSCLE ONCE A MONTH 3 mL 0   cyclobenzaprine (FLEXERIL) 10 MG tablet Take 1 tablet by mouth 3 (three) times daily as needed.     estradiol (ESTRACE) 0.1 MG/GM vaginal cream PLEASE SEE ATTACHED FOR DETAILED DIRECTIONS     HYDROcodone-acetaminophen (NORCO) 10-325 MG per tablet Take 1 tablet by mouth every 4 (four) hours as needed.     ISOtretinoin (ACCUTANE PO) Take by mouth.     No current facility-administered medications on file prior to visit.     Review of Systems  Constitutional:  Positive for fatigue. Negative for activity change, appetite change, fever and unexpected weight change.  HENT:  Negative for congestion, ear pain, rhinorrhea, sinus pressure and sore throat.   Eyes:  Negative for pain, redness and visual disturbance.  Respiratory:  Negative for cough, shortness of breath and wheezing.   Cardiovascular:  Negative for chest pain and palpitations.  Gastrointestinal:  Negative for abdominal pain, blood in stool, constipation and diarrhea.  Endocrine: Negative for polydipsia and polyuria.  Genitourinary:  Negative for dysuria, frequency and urgency.  Musculoskeletal:  Negative for arthralgias, back pain and myalgias.  Skin:  Negative for pallor and rash.       Dry skin from recent accutane   Allergic/Immunologic: Negative for environmental allergies.  Neurological:  Negative for dizziness, syncope and headaches.  Hematological:  Negative for adenopathy. Does not bruise/bleed easily.  Psychiatric/Behavioral:  Negative for decreased concentration and dysphoric mood. The patient is not nervous/anxious.        Mood- good and bad days      Objective:   Physical Exam Constitutional:      General: She is not in acute  distress.    Appearance: Normal appearance. She is well-developed. She is not ill-appearing or diaphoretic.  HENT:     Head: Normocephalic and atraumatic.     Right Ear: Tympanic membrane, ear canal and external ear normal.     Left Ear: Tympanic membrane, ear canal and external ear normal.     Nose: Nose normal. No congestion.     Mouth/Throat:     Mouth: Mucous membranes are moist.     Pharynx: Oropharynx is clear. No posterior oropharyngeal erythema.  Eyes:     General: No scleral icterus.    Extraocular Movements: Extraocular movements intact.     Conjunctiva/sclera: Conjunctivae normal.     Pupils: Pupils are equal, round, and reactive to light.  Neck:     Thyroid: No thyromegaly.     Vascular: No carotid bruit or JVD.  Cardiovascular:     Rate and Rhythm: Normal rate and regular rhythm.     Pulses: Normal pulses.     Heart sounds: Normal heart sounds.    No gallop.  Pulmonary:     Effort: Pulmonary effort is normal. No respiratory distress.     Breath sounds: Normal breath sounds. No wheezing.     Comments: Good air exch Chest:     Chest wall: No tenderness.  Abdominal:     General: Bowel sounds are normal. There is no distension or abdominal bruit.     Palpations: Abdomen is soft. There is no mass.  Tenderness: There is no abdominal tenderness.     Hernia: No hernia is present.  Genitourinary:    Comments: Breast exam: No mass, nodules, thickening, tenderness, bulging, retraction, inflamation, nipple discharge or skin changes noted.  No axillary or clavicular LA.     Musculoskeletal:        General: No tenderness. Normal range of motion.     Cervical back: Normal range of motion and neck supple. No rigidity. No muscular tenderness.     Right lower leg: No edema.     Left lower leg: No edema.  Lymphadenopathy:     Cervical: No cervical adenopathy.  Skin:    General: Skin is warm and dry.     Coloration: Skin is not pale.     Findings: No erythema or rash.      Comments: No acne Dry skin   Solar lentigines diffusely Few sks  Neurological:     Mental Status: She is alert. Mental status is at baseline.     Cranial Nerves: No cranial nerve deficit.     Motor: No abnormal muscle tone.     Coordination: Coordination normal.     Gait: Gait normal.     Deep Tendon Reflexes: Reflexes are normal and symmetric. Reflexes normal.  Psychiatric:        Mood and Affect: Mood normal.        Cognition and Memory: Cognition and memory normal.          Assessment & Plan:   Problem List Items Addressed This Visit       Cardiovascular and Mediastinum   Essential hypertension - Primary    bp in fair control at this time  BP Readings from Last 1 Encounters:  03/25/21 126/82  No changes needed Most recent labs reviewed  Disc lifstyle change with low sodium diet and exercise  Plan to continue hctz 25 mg daily and metoprolol xl 50 mg daily       Relevant Medications   hydrochlorothiazide (HYDRODIURIL) 25 MG tablet   metoprolol succinate (TOPROL-XL) 25 MG 24 hr tablet   rosuvastatin (CRESTOR) 10 MG tablet   Other Relevant Orders   CBC with Differential/Platelet   Comprehensive metabolic panel   Lipid panel   TSH     Other   Vitamin B12 deficiency    Taking shots monthly  Level drawn today      Relevant Orders   Vitamin B12   Hyperlipidemia    Disc goals for lipids and reasons to control them Rev last labs with pt Rev low sat fat diet in detail Labs ordered  Taking crestor 10 mg daily  Watching triglycerides for accutane use      Relevant Medications   hydrochlorothiazide (HYDRODIURIL) 25 MG tablet   metoprolol succinate (TOPROL-XL) 25 MG 24 hr tablet   rosuvastatin (CRESTOR) 10 MG tablet   TOBACCO ABUSE    She has stopped smoking at work (uses nicotine pouches)  Not ready to quit entirely but cut down by 1/2  Disc in detail risks of smoking and possible outcomes including copd, vascular/ heart disease, cancer , respiratory and  sinus infections  Pt voices understanding       Prediabetes   Relevant Orders   Hemoglobin A1c   Encounter for screening mammogram for breast cancer    Due for mammogram in February  Order done so she can call and schedule  Nl exam today      Relevant Orders   MM 3D SCREEN BREAST  BILATERAL   Dysthymia    Rev PHQ Good and bad days and overall doing well Plan to continue wellbutrin  Encouraged good self care      Relevant Medications   buPROPion (WELLBUTRIN XL) 300 MG 24 hr tablet   Low vitamin D level    D level added to labs No oral supplementation right now      Relevant Orders   VITAMIN D 25 Hydroxy (Vit-D Deficiency, Fractures)   Routine general medical examination at a health care facility    Reviewed health habits including diet and exercise and skin cancer prevention Reviewed appropriate screening tests for age  Also reviewed health mt list, fam hx and immunization status , as well as social and family history   See HPI Labs ordered  Flu shot given today  Plans to check on coverage of shingrix  Mammogram ordered Colonoscopy utd  Commended re: cutting down smoking and enc to quit        Other Visit Diagnoses     Need for influenza vaccination       Relevant Orders   Flu Vaccine QUAD 6+ mos PF IM (Fluarix Quad PF) (Completed)

## 2021-03-25 NOTE — Assessment & Plan Note (Signed)
D level added to labs No oral supplementation right now

## 2021-03-25 NOTE — Assessment & Plan Note (Signed)
bp in fair control at this time  BP Readings from Last 1 Encounters:  03/25/21 126/82   No changes needed Most recent labs reviewed  Disc lifstyle change with low sodium diet and exercise  Plan to continue hctz 25 mg daily and metoprolol xl 50 mg daily

## 2021-03-25 NOTE — Assessment & Plan Note (Signed)
Due for mammogram in February  Order done so she can call and schedule  Nl exam today

## 2021-03-25 NOTE — Patient Instructions (Addendum)
If you are interested in the shingles vaccine series (Shingrix), call your insurance or pharmacy to check on coverage and location it must be given.  If affordable - you can schedule it here or at your pharmacy depending on coverage   Keep thinking about quitting smoking   To prevent diabetes  Try to get most of your carbohydrates from produce (with the exception of white potatoes)  Eat less bread/pasta/rice/snack foods/cereals/sweets and other items from the middle of the grocery store (processed carbs)  Take care of yourself   Lab today    Schedule your mammogram  Please call the location of your choice from the menu below to schedule your Mammogram and/or Bone Density appointment.    [facility]                      Phone:  [phone], [place] [address]                                                             Services: Traditional and 3D Mammogram, [facility]                 Phone: [phone] [address], [place] [address]    Service: Bone Density ONLY   *this site does NOT perform mammograms  [place]                        Phone:  [phone] [address], [place] [address]                                            Services:  3D Mammogram and [place] at [facility]   Phone:  [phone]   [place],  [address]  Services: 3D Mammogram and Bone Density  [place] at [facility])  Phone:  [phone]   [address] [place], [place] [address]                                              Services:  3D Mammogram and Bone Density

## 2021-03-26 LAB — COMPREHENSIVE METABOLIC PANEL
AG Ratio: 2.2 (calc) (ref 1.0–2.5)
ALT: 13 U/L (ref 6–29)
AST: 17 U/L (ref 10–35)
Albumin: 4.6 g/dL (ref 3.6–5.1)
Alkaline phosphatase (APISO): 57 U/L (ref 37–153)
BUN: 13 mg/dL (ref 7–25)
CO2: 26 mmol/L (ref 20–32)
Calcium: 9.8 mg/dL (ref 8.6–10.4)
Chloride: 105 mmol/L (ref 98–110)
Creat: 0.69 mg/dL (ref 0.50–1.03)
Globulin: 2.1 g/dL (calc) (ref 1.9–3.7)
Glucose, Bld: 100 mg/dL — ABNORMAL HIGH (ref 65–99)
Potassium: 4.1 mmol/L (ref 3.5–5.3)
Sodium: 139 mmol/L (ref 135–146)
Total Bilirubin: 0.3 mg/dL (ref 0.2–1.2)
Total Protein: 6.7 g/dL (ref 6.1–8.1)

## 2021-03-26 LAB — CBC WITH DIFFERENTIAL/PLATELET
Absolute Monocytes: 410 cells/uL (ref 200–950)
Basophils Absolute: 29 cells/uL (ref 0–200)
Basophils Relative: 0.5 %
Eosinophils Absolute: 148 cells/uL (ref 15–500)
Eosinophils Relative: 2.6 %
HCT: 43.4 % (ref 35.0–45.0)
Hemoglobin: 14.6 g/dL (ref 11.7–15.5)
Lymphs Abs: 2029 cells/uL (ref 850–3900)
MCH: 28.4 pg (ref 27.0–33.0)
MCHC: 33.6 g/dL (ref 32.0–36.0)
MCV: 84.4 fL (ref 80.0–100.0)
MPV: 12.8 fL — ABNORMAL HIGH (ref 7.5–12.5)
Monocytes Relative: 7.2 %
Neutro Abs: 3084 cells/uL (ref 1500–7800)
Neutrophils Relative %: 54.1 %
Platelets: 213 10*3/uL (ref 140–400)
RBC: 5.14 10*6/uL — ABNORMAL HIGH (ref 3.80–5.10)
RDW: 14.2 % (ref 11.0–15.0)
Total Lymphocyte: 35.6 %
WBC: 5.7 10*3/uL (ref 3.8–10.8)

## 2021-03-26 LAB — VITAMIN D 25 HYDROXY (VIT D DEFICIENCY, FRACTURES): Vit D, 25-Hydroxy: 30 ng/mL (ref 30–100)

## 2021-03-26 LAB — LIPID PANEL
Cholesterol: 160 mg/dL (ref <200)
HDL: 43 mg/dL — ABNORMAL LOW (ref >=50)
LDL Cholesterol (Calc): 80 mg/dL (calc)
Non-HDL Cholesterol (Calc): 117 mg/dL (calc) (ref <130)
Total CHOL/HDL Ratio: 3.7 (calc) (ref <5.0)
Triglycerides: 279 mg/dL — ABNORMAL HIGH (ref <150)

## 2021-03-26 LAB — HEMOGLOBIN A1C
Hgb A1c MFr Bld: 6 % of total Hgb — ABNORMAL HIGH (ref <5.7)
Mean Plasma Glucose: 126 mg/dL
eAG (mmol/L): 7 mmol/L

## 2021-03-26 LAB — TSH: TSH: 0.43 mIU/L

## 2021-03-26 LAB — VITAMIN B12: Vitamin B-12: 745 pg/mL (ref 200–1100)

## 2021-04-07 ENCOUNTER — Other Ambulatory Visit: Payer: Self-pay

## 2021-04-07 ENCOUNTER — Telehealth: Payer: Self-pay

## 2021-04-07 DIAGNOSIS — R935 Abnormal findings on diagnostic imaging of other abdominal regions, including retroperitoneum: Secondary | ICD-10-CM

## 2021-04-07 NOTE — Telephone Encounter (Signed)
-----   Message from Yevette Edwards, RN sent at 04/05/2020  1:24 PM EST ----- Regarding: MRI/MRCP Repeat MRI/MRCP, see 04/05/20 MRI/MRCP result note.

## 2021-04-07 NOTE — Telephone Encounter (Signed)
Called patient and scheduled MRI/MRCP at [facility] on 04/18/21. Patient to arrive at 12:30 pm and be NPO 4 hours before. (This is 1 yr. F/U MRI/MRCP)

## 2021-04-08 ENCOUNTER — Encounter: Payer: Self-pay | Admitting: Family Medicine

## 2021-04-18 ENCOUNTER — Other Ambulatory Visit: Payer: Self-pay | Admitting: Physician Assistant

## 2021-04-18 ENCOUNTER — Ambulatory Visit (HOSPITAL_COMMUNITY): Admission: RE | Admit: 2021-04-18 | Payer: 59 | Source: Ambulatory Visit

## 2021-04-18 DIAGNOSIS — R935 Abnormal findings on diagnostic imaging of other abdominal regions, including retroperitoneum: Secondary | ICD-10-CM

## 2021-05-05 ENCOUNTER — Other Ambulatory Visit: Payer: Self-pay | Admitting: Family Medicine

## 2021-08-14 ENCOUNTER — Other Ambulatory Visit: Payer: Self-pay | Admitting: Physical Medicine and Rehabilitation

## 2021-08-14 DIAGNOSIS — M549 Dorsalgia, unspecified: Secondary | ICD-10-CM

## 2021-12-29 ENCOUNTER — Other Ambulatory Visit (HOSPITAL_COMMUNITY): Payer: Self-pay

## 2021-12-29 MED ORDER — HYDROCODONE-ACETAMINOPHEN 10-325 MG PO TABS
1.0000 | ORAL_TABLET | Freq: Four times a day (QID) | ORAL | 0 refills | Status: DC | PRN
  Filled 2021-12-29: qty 120, 30d supply, fill #0

## 2022-01-22 ENCOUNTER — Other Ambulatory Visit (HOSPITAL_COMMUNITY): Payer: Self-pay

## 2022-01-22 MED ORDER — HYDROCODONE-ACETAMINOPHEN 10-325 MG PO TABS
ORAL_TABLET | ORAL | 0 refills | Status: DC
  Filled 2022-02-25: qty 120, 30d supply, fill #0

## 2022-01-22 MED ORDER — HYDROCODONE-ACETAMINOPHEN 10-325 MG PO TABS
ORAL_TABLET | ORAL | 0 refills | Status: DC
  Filled 2022-01-22 – 2022-01-26 (×2): qty 120, 30d supply, fill #0

## 2022-01-26 ENCOUNTER — Other Ambulatory Visit (HOSPITAL_COMMUNITY): Payer: Self-pay

## 2022-02-05 ENCOUNTER — Other Ambulatory Visit: Payer: Self-pay | Admitting: Family Medicine

## 2022-02-25 ENCOUNTER — Other Ambulatory Visit (HOSPITAL_COMMUNITY): Payer: Self-pay

## 2022-03-06 ENCOUNTER — Other Ambulatory Visit: Payer: Self-pay | Admitting: Family Medicine

## 2022-03-15 ENCOUNTER — Other Ambulatory Visit: Payer: Self-pay | Admitting: Family Medicine

## 2022-03-17 NOTE — Telephone Encounter (Signed)
Pt's due for her CPE (labs prior if possible) after 03/26/22, please schedule and then route back to me to refill

## 2022-03-18 NOTE — Telephone Encounter (Signed)
LVM for patient tcb and schedule 

## 2022-03-19 ENCOUNTER — Telehealth: Payer: Self-pay | Admitting: Family Medicine

## 2022-03-19 ENCOUNTER — Other Ambulatory Visit (HOSPITAL_COMMUNITY): Payer: Self-pay

## 2022-03-19 DIAGNOSIS — I10 Essential (primary) hypertension: Secondary | ICD-10-CM

## 2022-03-19 DIAGNOSIS — R7303 Prediabetes: Secondary | ICD-10-CM

## 2022-03-19 DIAGNOSIS — E559 Vitamin D deficiency, unspecified: Secondary | ICD-10-CM

## 2022-03-19 DIAGNOSIS — E7800 Pure hypercholesterolemia, unspecified: Secondary | ICD-10-CM

## 2022-03-19 DIAGNOSIS — E538 Deficiency of other specified B group vitamins: Secondary | ICD-10-CM

## 2022-03-19 MED ORDER — HYDROCODONE-ACETAMINOPHEN 10-325 MG PO TABS
1.0000 | ORAL_TABLET | Freq: Four times a day (QID) | ORAL | 0 refills | Status: DC | PRN
  Filled 2022-03-26: qty 120, 30d supply, fill #0

## 2022-03-19 MED ORDER — HYDROCODONE-ACETAMINOPHEN 10-325 MG PO TABS
1.0000 | ORAL_TABLET | Freq: Four times a day (QID) | ORAL | 0 refills | Status: DC | PRN
  Filled 2022-04-30: qty 120, 30d supply, fill #0

## 2022-03-19 NOTE — Telephone Encounter (Signed)
Orders are in

## 2022-03-19 NOTE — Telephone Encounter (Signed)
Orders released directly into their sxs. Called pt to see if she still wants to pick up the paper orders but no answer so Left VM requesting pt to call the office back

## 2022-03-19 NOTE — Telephone Encounter (Signed)
Please placed orders and I can released them into their sxs

## 2022-03-19 NOTE — Telephone Encounter (Signed)
Pt called stated she would need a scrip for her CPXLAB sent to [facility]. Her physical is schedule for 04/03/22

## 2022-03-19 NOTE — Telephone Encounter (Signed)
LVM for patient to cb and sent mychart message.

## 2022-03-23 ENCOUNTER — Other Ambulatory Visit (HOSPITAL_COMMUNITY): Payer: Self-pay

## 2022-03-26 ENCOUNTER — Other Ambulatory Visit (HOSPITAL_COMMUNITY): Payer: Self-pay

## 2022-04-03 ENCOUNTER — Ambulatory Visit (INDEPENDENT_AMBULATORY_CARE_PROVIDER_SITE_OTHER): Payer: 59 | Admitting: Family Medicine

## 2022-04-03 ENCOUNTER — Encounter: Payer: Self-pay | Admitting: Family Medicine

## 2022-04-03 VITALS — BP 128/82 | HR 79 | Temp 97.9°F | Ht 64.0 in | Wt 158.2 lb

## 2022-04-03 DIAGNOSIS — Z23 Encounter for immunization: Secondary | ICD-10-CM | POA: Diagnosis not present

## 2022-04-03 DIAGNOSIS — G4733 Obstructive sleep apnea (adult) (pediatric): Secondary | ICD-10-CM | POA: Diagnosis not present

## 2022-04-03 DIAGNOSIS — E538 Deficiency of other specified B group vitamins: Secondary | ICD-10-CM

## 2022-04-03 DIAGNOSIS — F341 Dysthymic disorder: Secondary | ICD-10-CM

## 2022-04-03 DIAGNOSIS — E876 Hypokalemia: Secondary | ICD-10-CM

## 2022-04-03 DIAGNOSIS — F17200 Nicotine dependence, unspecified, uncomplicated: Secondary | ICD-10-CM

## 2022-04-03 DIAGNOSIS — Z0000 Encounter for general adult medical examination without abnormal findings: Secondary | ICD-10-CM

## 2022-04-03 DIAGNOSIS — R7303 Prediabetes: Secondary | ICD-10-CM

## 2022-04-03 DIAGNOSIS — Z1231 Encounter for screening mammogram for malignant neoplasm of breast: Secondary | ICD-10-CM

## 2022-04-03 DIAGNOSIS — E7800 Pure hypercholesterolemia, unspecified: Secondary | ICD-10-CM | POA: Diagnosis not present

## 2022-04-03 DIAGNOSIS — E559 Vitamin D deficiency, unspecified: Secondary | ICD-10-CM

## 2022-04-03 DIAGNOSIS — I10 Essential (primary) hypertension: Secondary | ICD-10-CM

## 2022-04-03 NOTE — Progress Notes (Signed)
Subjective:    Patient ID: Desiree Walters, female    DOB: March 22, 1970, 52 y.o.   MRN: [id number]  HPI Here for health maintenance exam and to review chronic medical problems    Wt Readings from Last 3 Encounters:  04/03/22 158 lb 4 oz (71.8 kg)  03/25/21 175 lb 2 oz (79.4 kg)  07/12/20 180 lb (81.6 kg)   27.16 kg/m  Doing a program called ETM (eager to motivate) -on line out of [place] Since may  Protein/ veggies/fat  Lots of support  Her sister lost 100 lb with it   Treadmill and bike  Really likes it  Drinks a gallon of water per day    New habits   Immunization History  Administered Date(s) Administered   Influenza Split 11/28/2012, 12/14/2013   Influenza Whole 12/15/2006   Influenza,inj,Quad PF,6+ Mos 12/21/2016, 02/18/2018, 12/16/2018, 03/25/2021   Influenza-Unspecified 11/16/2014, 12/31/2019   PFIZER(Purple Top)SARS-COV-2 Vaccination 03/31/2019, 05/01/2019, 12/31/2019   Td 07/29/2007   Tdap 12/21/2016   Health Maintenance Due  Topic Date Due   Lung Cancer Screening  Never done   Zoster Vaccines- Shingrix (1 of 2) Never done   MAMMOGRAM  04/30/2021   INFLUENZA VACCINE  10/14/2021   COVID-19 Vaccine (4 - 2023-24 season) 11/14/2021   Smoking status : using some nicotine pouches to not smoke as much  Knows she has to quit eventually but not ready yet  Smokes 1/2 ppd now   Shingrix: is interested  Had shingles twice   Flu shot :  today   Colonoscopy 04/2020 with 7 y recall   Mammogram 04/2020- has not done  Self breast exam : no lumps    HTN bp is stable today  No cp or palpitations or headaches or edema  No side effects to medicines  BP Readings from Last 3 Encounters:  04/03/22 128/82  03/25/21 126/82  07/12/20 122/78    Pulse Readings from Last 3 Encounters:  04/03/22 79  03/25/21 83  07/12/20 87    Hctz 25 mg daily  Metoprolol xl 50 mg daily   Migraines : in remission for a few years   OSA Cpap- not currently using  Gives  her issues with her face  Lost weight  Still sores some   Mood:  Ok without the wellbutrin  Stress- cares for her grandson and it is very tough (14)  He has psychotic episodes      04/03/2022    5:15 PM 03/25/2021   12:53 PM 03/06/2020   10:53 AM 03/03/2019    9:31 AM 02/18/2018   11:51 AM  Depression screen PHQ 2/9  Decreased Interest 0 0 1 0 2  Down, Depressed, Hopeless 1 0 0 2 0  PHQ - 2 Score 1 0 '1 2 2  '$ Altered sleeping '2 2 1 1 '$ 0  Tired, decreased energy '2 1 2 '$ 0 2  Change in appetite '1 1 2 '$ 0 3  Feeling bad or failure about yourself  0 0 0 0 1  Trouble concentrating '1 2 3 '$ 0 3  Moving slowly or fidgety/restless 0 0 0 0 2  Suicidal thoughts 0 0 0 0 0  PHQ-9 Score '7 6 9 3 13  '$ Difficult doing work/chores Somewhat difficult Not difficult at all Not difficult at all Not difficult at all      Hyperlipidemia Lab Results  Component Value Date   CHOL 160 03/25/2021   HDL 43 (L) 03/25/2021   LDLCALC 80 03/25/2021   LDLDIRECT  159.0 02/18/2018   TRIG 279 (H) 03/25/2021   CHOLHDL 3.7 03/25/2021   Crestor 10 mg daily -still taking  Takes accutane   Due for labs   Prediabetes Lab Results  Component Value Date   HGBA1C 6.0 (H) 03/25/2021   B12 def Lab Results  Component Value Date   VITAMINB12 745 03/25/2021   D def-not taking any  Taking magnesium for muscle cramps   Patient Active Problem List   Diagnosis Date Noted   Routine general medical examination at a health care facility 03/25/2021   Recurrent UTI 07/12/2020   Hypokalemia 07/12/2020   Encounter for general adult medical examination with abnormal findings 03/05/2019   Dysthymia 03/03/2019   Vitamin D deficiency 03/03/2019   Screening mammogram, encounter for 02/18/2018   Migraine without aura 01/21/2017   Menopausal syndrome (hot flashes) 05/09/2014   Encounter for screening mammogram for breast cancer 05/09/2014   Vitamin B12 deficiency 10/09/2009   Hyperlipidemia 10/09/2009   Prediabetes 10/09/2009    RHINITIS 05/10/2008   HYPERSOMNIA 09/26/2007   Obstructive sleep apnea 08/20/2006   TOBACCO ABUSE 08/04/2006   Essential hypertension 08/04/2006   OBSESSIVE-COMPULSIVE DISORDER 07/26/2006   FIBROMYALGIA 07/26/2006   Past Medical History:  Diagnosis Date   Asthma    Diabetes mellitus    Fatty liver    Fibromyalgia    Hyperlipemia    Hypersomnia    NPSG 08-25-07-1.2/hr   Obesity    OCD (obsessive compulsive disorder)    Pernicious anemia    Sleep apnea    uses CPAP   Tobacco abuse    Past Surgical History:  Procedure Laterality Date   ABDOMINAL HYSTERECTOMY     AUGMENTATION MAMMAPLASTY Bilateral    BREAST ENHANCEMENT SURGERY  2003   FOOT SURGERY  2005; 2008   IUD - Merina  2008   KNEE SURGERY Left    NASAL SEPTUM SURGERY     Social History   Tobacco Use   Smoking status: Every Day    Packs/day: 0.50    Years: 20.00    Total pack years: 10.00    Types: Cigarettes   Smokeless tobacco: Never  Vaping Use   Vaping Use: Never used  Substance Use Topics   Alcohol use: Not Currently   Drug use: No   Family History  Problem Relation Age of Onset   Hypertension Father    Coronary artery disease Father    Sleep apnea Father        with CPAP   Heart disease Father        CAD   Hyperlipidemia Father    Hypertension Mother    Hyperlipidemia Mother    Hypertension Sister    Hyperlipidemia Other        aunts/uncles   Coronary artery disease Other        aunts/uncles   Stroke Maternal Grandmother    Multiple sclerosis Neg Hx    Colon cancer Neg Hx    Pancreatic cancer Neg Hx    Esophageal cancer Neg Hx    Breast cancer Neg Hx    Colon polyps Neg Hx    Stomach cancer Neg Hx    Rectal cancer Neg Hx    Allergies  Allergen Reactions   Ciprofloxacin     REACTION: reaction not known   Levofloxacin     REACTION: reaction not known   Quinolones     Other reaction(s): Unknown   Varenicline Tartrate     REACTION: headache   Current Outpatient  Medications on  File Prior to Visit  Medication Sig Dispense Refill   clonazePAM (KLONOPIN) 0.5 MG tablet Take 1 tablet by mouth at bedtime.     cyanocobalamin (VITAMIN B12) 1000 MCG/ML injection INJECT 1 ML INTO THE MUSCLE ONCE A MONTH 3 mL 0   cyclobenzaprine (FLEXERIL) 10 MG tablet Take 1 tablet by mouth 3 (three) times daily as needed.     estradiol (ESTRACE) 0.1 MG/GM vaginal cream PLEASE SEE ATTACHED FOR DETAILED DIRECTIONS     hydrochlorothiazide (HYDRODIURIL) 25 MG tablet Take 1 tablet (25 mg total) by mouth daily. 90 tablet 3   HYDROcodone-acetaminophen (NORCO) 10-325 MG per tablet Take 1 tablet by mouth every 4 (four) hours as needed.     HYDROcodone-acetaminophen (NORCO) 10-325 MG tablet Take 1 tablet by mouth every 6 (six) hours as needed for pain 120 tablet 0   HYDROcodone-acetaminophen (NORCO) 10-325 MG tablet Take 1 tablet by mouth every six hours as needed for pain 120 tablet 0   HYDROcodone-acetaminophen (NORCO) 10-325 MG tablet Take 1 tablet by mouth every six hours as needed for pain 120 tablet 0   HYDROcodone-acetaminophen (NORCO) 10-325 MG tablet Take 1 tablet by mouth every 6 (six) hours as needed for pain 120 tablet 0   [START ON 04/16/2022] HYDROcodone-acetaminophen (NORCO) 10-325 MG tablet Take 1 tablet by mouth every 6 (six) hours as needed for pain (fill 04/16/22) 120 tablet 0   ISOtretinoin (ACCUTANE PO) Take by mouth.     metoprolol succinate (TOPROL-XL) 25 MG 24 hr tablet Take 1 tablet (25 mg total) by mouth daily. 90 tablet 3   rosuvastatin (CRESTOR) 10 MG tablet Take 1 tablet (10 mg total) by mouth daily. 90 tablet 3   No current facility-administered medications on file prior to visit.    Review of Systems  Constitutional:  Negative for activity change, appetite change, fatigue, fever and unexpected weight change.  HENT:  Negative for congestion, ear pain, rhinorrhea, sinus pressure and sore throat.   Eyes:  Negative for pain, redness and visual disturbance.  Respiratory:   Negative for cough, shortness of breath and wheezing.   Cardiovascular:  Negative for chest pain and palpitations.  Gastrointestinal:  Negative for abdominal pain, blood in stool, constipation and diarrhea.  Endocrine: Negative for polydipsia and polyuria.  Genitourinary:  Negative for dysuria, frequency and urgency.  Musculoskeletal:  Positive for arthralgias and myalgias. Negative for back pain.  Skin:  Negative for pallor and rash.  Allergic/Immunologic: Negative for environmental allergies.  Neurological:  Negative for dizziness, syncope and headaches.  Hematological:  Negative for adenopathy. Does not bruise/bleed easily.  Psychiatric/Behavioral:  Negative for decreased concentration and dysphoric mood. The patient is not nervous/anxious.        Objective:   Physical Exam Constitutional:      General: She is not in acute distress.    Appearance: Normal appearance. She is well-developed and normal weight. She is not ill-appearing or diaphoretic.  HENT:     Head: Normocephalic and atraumatic.     Right Ear: Tympanic membrane, ear canal and external ear normal.     Left Ear: Tympanic membrane, ear canal and external ear normal.     Nose: Nose normal. No congestion.     Mouth/Throat:     Mouth: Mucous membranes are moist.     Pharynx: Oropharynx is clear. No posterior oropharyngeal erythema.  Eyes:     General: No scleral icterus.    Extraocular Movements: Extraocular movements intact.     Conjunctiva/sclera:  Conjunctivae normal.     Pupils: Pupils are equal, round, and reactive to light.  Neck:     Thyroid: No thyromegaly.     Vascular: No carotid bruit or JVD.  Cardiovascular:     Rate and Rhythm: Normal rate and regular rhythm.     Pulses: Normal pulses.     Heart sounds: Normal heart sounds.     No gallop.  Pulmonary:     Effort: Pulmonary effort is normal. No respiratory distress.     Breath sounds: Normal breath sounds. No wheezing.     Comments: Good air  exch Chest:     Chest wall: No tenderness.  Abdominal:     General: Bowel sounds are normal. There is no distension or abdominal bruit.     Palpations: Abdomen is soft. There is no mass.     Tenderness: There is no abdominal tenderness.     Hernia: No hernia is present.  Genitourinary:    Comments: Breast exam: No mass, nodules, thickening, tenderness, bulging, retraction, inflamation, nipple discharge or skin changes noted.  No axillary or clavicular LA.     Musculoskeletal:        General: No tenderness. Normal range of motion.     Cervical back: Normal range of motion and neck supple. No rigidity. No muscular tenderness.     Right lower leg: No edema.     Left lower leg: No edema.     Comments: No kyphosis   Lymphadenopathy:     Cervical: No cervical adenopathy.  Skin:    General: Skin is warm and dry.     Coloration: Skin is not pale.     Findings: No erythema or rash.     Comments: Solar lentigines diffusely   Neurological:     Mental Status: She is alert. Mental status is at baseline.     Cranial Nerves: No cranial nerve deficit.     Motor: No abnormal muscle tone.     Coordination: Coordination normal.     Gait: Gait normal.     Deep Tendon Reflexes: Reflexes are normal and symmetric. Reflexes normal.  Psychiatric:        Mood and Affect: Mood normal.        Cognition and Memory: Cognition and memory normal.           Assessment & Plan:   Problem List Items Addressed This Visit       Cardiovascular and Mediastinum   Essential hypertension    bp in fair control at this time  BP Readings from Last 1 Encounters:  04/03/22 128/82  No changes needed Most recent labs reviewed  Disc lifstyle change with low sodium diet and exercise  Plan to continue hctz 25 mg daily and metoprolol xl 50 mg daily       Relevant Orders   TSH (Completed)   Lipid panel (Completed)   Comprehensive metabolic panel (Completed)   CBC with Differential/Platelet (Completed)      Respiratory   Obstructive sleep apnea - Primary    Has lost wt  Some issues with her cpap mask/fit  Will f/u with pulm        Other   Dysthymia    Doing well without wellbutrin now  Stressors- caring for grandson Reviewed stressors/ coping techniques/symptoms/ support sources/ tx options and side effects in detail today  Offered counseling      Encounter for screening mammogram for breast cancer    Mammogram ordered Pt will call to  schedule       Relevant Orders   MM 3D SCREEN BREAST BILATERAL   Hyperlipidemia    Disc goals for lipids and reasons to control them Rev last labs with pt Rev low sat fat diet in detail  Labs ordered today  Diet is improved  Taking crestor 10 mg daily      Relevant Orders   Lipid panel (Completed)   Comprehensive metabolic panel (Completed)   Hypokalemia    Lab today  Taking hctz      Relevant Orders   Comprehensive metabolic panel (Completed)   Prediabetes    A1c ordreed Commended on better habits and wt loss       Relevant Orders   Hemoglobin A1c (Completed)   Routine general medical examination at a health care facility    Reviewed health habits including diet and exercise and skin cancer prevention Reviewed appropriate screening tests for age  Also reviewed health mt list, fam hx and immunization status , as well as social and family history   See HPI Labs ordered Counseled on smoking cessation  Plans to check on coverage of shingrix vaccine  [place] [address] with 7 y recall  Mammogram ordered for pt to call and schedule       TOBACCO ABUSE    She has cut back  Uses nicotine pouch prn  Down to 1/2 ppd  Not ready to quit entirely   Disc in detail risks of smoking and possible outcomes including copd, vascular/ heart disease, cancer , respiratory and sinus infections  Pt voices understanding       Vitamin B12 deficiency    B12 level today  She does injections at home monthly      Relevant  Orders   Vitamin B12 (Completed)   Vitamin D deficiency    D level today  Not currently taking any   Disc risks of OP with smoking and menop status  Recommend suppl both ca and D if able      Relevant Orders   VITAMIN D 25 Hydroxy (Vit-D Deficiency, Fractures) (Completed)   Other Visit Diagnoses     Need for influenza vaccination       Relevant Orders   Flu Vaccine QUAD 6+ mos PF IM (Fluarix Quad PF) (Completed)

## 2022-04-03 NOTE — Patient Instructions (Addendum)
Keep thinking about quitting smoking   If you are interested in the shingles vaccine series (Shingrix), call your insurance or pharmacy to check on coverage and location it must be given.  If affordable - you can schedule it here or at your pharmacy depending on coverage   Flu shot today   Try to get 1200-1500 mg of calcium per day with at least 1000 iu of vitamin D - for bone health  Take care of yourself  Use sun protection   Keep up the great work with diet and exercise   Labs today   Call and schedule your mammogram   Please call the location of your choice from the menu below to schedule your Mammogram and/or Bone Density appointment.    [facility]                      Phone:  [phone], [place] [address]                                                             Services: Traditional and 3D Mammogram, [place] [facility]                 Phone: [phone] [address], [place] [address]    Service: Bone Density ONLY   *this site does NOT perform mammograms  [facility]                        Phone:  [phone] [address], [place] [address]                                            Services:  3D Mammogram and [place] at [facility]   Phone:  [phone]   [place] [place], [place] [address]  Services: 3D Mammogram and Bone Density  [place] at [facility])  Phone:  [phone]   [address] [place], [place] [address]                                               Services:  3D Mammogram and Bone Density

## 2022-04-04 LAB — COMPREHENSIVE METABOLIC PANEL
AG Ratio: 2.1 (calc) (ref 1.0–2.5)
ALT: 10 U/L (ref 6–29)
AST: 13 U/L (ref 10–35)
Albumin: 4.5 g/dL (ref 3.6–5.1)
Alkaline phosphatase (APISO): 54 U/L (ref 37–153)
BUN: 12 mg/dL (ref 7–25)
CO2: 26 mmol/L (ref 20–32)
Calcium: 9.5 mg/dL (ref 8.6–10.4)
Chloride: 106 mmol/L (ref 98–110)
Creat: 0.59 mg/dL (ref 0.50–1.03)
Globulin: 2.1 g/dL (calc) (ref 1.9–3.7)
Glucose, Bld: 96 mg/dL (ref 65–99)
Potassium: 3.8 mmol/L (ref 3.5–5.3)
Sodium: 142 mmol/L (ref 135–146)
Total Bilirubin: 0.3 mg/dL (ref 0.2–1.2)
Total Protein: 6.6 g/dL (ref 6.1–8.1)

## 2022-04-04 LAB — CBC WITH DIFFERENTIAL/PLATELET
Absolute Monocytes: 453 cells/uL (ref 200–950)
Basophils Absolute: 19 cells/uL (ref 0–200)
Basophils Relative: 0.3 %
Eosinophils Absolute: 198 cells/uL (ref 15–500)
Eosinophils Relative: 3.2 %
HCT: 42.4 % (ref 35.0–45.0)
Hemoglobin: 14.3 g/dL (ref 11.7–15.5)
Lymphs Abs: 2406 cells/uL (ref 850–3900)
MCH: 28.5 pg (ref 27.0–33.0)
MCHC: 33.7 g/dL (ref 32.0–36.0)
MCV: 84.6 fL (ref 80.0–100.0)
MPV: 13.7 fL — ABNORMAL HIGH (ref 7.5–12.5)
Monocytes Relative: 7.3 %
Neutro Abs: 3125 cells/uL (ref 1500–7800)
Neutrophils Relative %: 50.4 %
Platelets: 183 10*3/uL (ref 140–400)
RBC: 5.01 10*6/uL (ref 3.80–5.10)
RDW: 13.2 % (ref 11.0–15.0)
Total Lymphocyte: 38.8 %
WBC: 6.2 10*3/uL (ref 3.8–10.8)

## 2022-04-04 LAB — HEMOGLOBIN A1C
Hgb A1c MFr Bld: 5.9 % of total Hgb — ABNORMAL HIGH (ref <5.7)
Mean Plasma Glucose: 123 mg/dL
eAG (mmol/L): 6.8 mmol/L

## 2022-04-04 LAB — TSH: TSH: 1.18 mIU/L

## 2022-04-04 LAB — LIPID PANEL
Cholesterol: 136 mg/dL (ref <200)
HDL: 48 mg/dL — ABNORMAL LOW (ref >=50)
LDL Cholesterol (Calc): 67 mg/dL (calc)
Non-HDL Cholesterol (Calc): 88 mg/dL (calc) (ref <130)
Total CHOL/HDL Ratio: 2.8 (calc) (ref <5.0)
Triglycerides: 119 mg/dL (ref <150)

## 2022-04-04 LAB — VITAMIN D 25 HYDROXY (VIT D DEFICIENCY, FRACTURES): Vit D, 25-Hydroxy: 28 ng/mL — ABNORMAL LOW (ref 30–100)

## 2022-04-04 LAB — VITAMIN B12: Vitamin B-12: 590 pg/mL (ref 200–1100)

## 2022-04-05 NOTE — Assessment & Plan Note (Signed)
Mammogram ordered °Pt will call to schedule  °

## 2022-04-05 NOTE — Assessment & Plan Note (Signed)
She has cut back  Uses nicotine pouch prn  Down to 1/2 ppd  Not ready to quit entirely   Disc in detail risks of smoking and possible outcomes including copd, vascular/ heart disease, cancer , respiratory and sinus infections  Pt voices understanding

## 2022-04-05 NOTE — Assessment & Plan Note (Signed)
Has lost wt  Some issues with her cpap mask/fit  Will f/u with pulm

## 2022-04-05 NOTE — Assessment & Plan Note (Signed)
bp in fair control at this time  BP Readings from Last 1 Encounters:  04/03/22 128/82   No changes needed Most recent labs reviewed  Disc lifstyle change with low sodium diet and exercise  Plan to continue hctz 25 mg daily and metoprolol xl 50 mg daily

## 2022-04-05 NOTE — Assessment & Plan Note (Signed)
A1c ordreed Commended on better habits and wt loss

## 2022-04-05 NOTE — Assessment & Plan Note (Signed)
Lab today  Taking hctz

## 2022-04-05 NOTE — Assessment & Plan Note (Signed)
Disc goals for lipids and reasons to control them Rev last labs with pt Rev low sat fat diet in detail  Labs ordered today  Diet is improved  Taking crestor 10 mg daily

## 2022-04-05 NOTE — Assessment & Plan Note (Signed)
B12 level today  She does injections at home monthly

## 2022-04-05 NOTE — Assessment & Plan Note (Signed)
Doing well without wellbutrin now  Stressors- caring for grandson Reviewed stressors/ coping techniques/symptoms/ support sources/ tx options and side effects in detail today  Offered counseling

## 2022-04-05 NOTE — Assessment & Plan Note (Signed)
Reviewed health habits including diet and exercise and skin cancer prevention Reviewed appropriate screening tests for age  Also reviewed health mt list, fam hx and immunization status , as well as social and family history   See HPI Labs ordered Counseled on smoking cessation  Plans to check on coverage of shingrix vaccine  [place] shot [place] [address] with 7 y recall  Mammogram ordered for pt to call and schedule

## 2022-04-05 NOTE — Assessment & Plan Note (Addendum)
D level today  Not currently taking any   Disc risks of OP with smoking and menop status  Recommend suppl both ca and D if able

## 2022-04-06 ENCOUNTER — Encounter: Payer: Self-pay | Admitting: *Deleted

## 2022-04-11 ENCOUNTER — Other Ambulatory Visit: Payer: Self-pay | Admitting: Family Medicine

## 2022-04-30 ENCOUNTER — Other Ambulatory Visit (HOSPITAL_COMMUNITY): Payer: Self-pay

## 2022-05-14 ENCOUNTER — Other Ambulatory Visit (HOSPITAL_COMMUNITY): Payer: Self-pay

## 2022-05-14 MED ORDER — HYDROCODONE-ACETAMINOPHEN 10-325 MG PO TABS
ORAL_TABLET | ORAL | 0 refills | Status: DC
  Filled 2022-05-14 – 2022-06-02 (×2): qty 120, 30d supply, fill #0

## 2022-05-14 MED ORDER — HYDROCODONE-ACETAMINOPHEN 10-325 MG PO TABS
1.0000 | ORAL_TABLET | Freq: Four times a day (QID) | ORAL | 0 refills | Status: DC | PRN
  Filled 2022-08-06: qty 120, 30d supply, fill #0

## 2022-06-02 ENCOUNTER — Other Ambulatory Visit (HOSPITAL_COMMUNITY): Payer: Self-pay

## 2022-06-20 ENCOUNTER — Other Ambulatory Visit: Payer: Self-pay | Admitting: Family Medicine

## 2022-07-07 ENCOUNTER — Other Ambulatory Visit: Payer: Self-pay

## 2022-07-07 ENCOUNTER — Other Ambulatory Visit (HOSPITAL_COMMUNITY): Payer: Self-pay

## 2022-07-07 MED ORDER — CLONAZEPAM 0.5 MG PO TABS
0.5000 mg | ORAL_TABLET | Freq: Every evening | ORAL | 3 refills | Status: DC
  Filled 2022-07-07 (×2): qty 30, 30d supply, fill #0
  Filled 2022-08-06: qty 30, 30d supply, fill #1
  Filled 2022-09-08: qty 30, 30d supply, fill #2
  Filled 2022-10-06: qty 30, 30d supply, fill #3

## 2022-07-07 MED ORDER — HYDROCODONE-ACETAMINOPHEN 10-325 MG PO TABS
1.0000 | ORAL_TABLET | Freq: Four times a day (QID) | ORAL | 0 refills | Status: DC | PRN
  Filled 2022-07-07: qty 120, 30d supply, fill #0

## 2022-07-07 MED ORDER — CYCLOBENZAPRINE HCL 10 MG PO TABS
10.0000 mg | ORAL_TABLET | Freq: Three times a day (TID) | ORAL | 3 refills | Status: DC
  Filled 2022-07-07: qty 90, 30d supply, fill #0
  Filled 2022-08-06: qty 90, 30d supply, fill #1
  Filled 2022-12-04: qty 90, 30d supply, fill #2
  Filled 2023-03-08: qty 90, 30d supply, fill #3

## 2022-07-07 MED ORDER — HYDROCODONE-ACETAMINOPHEN 10-325 MG PO TABS
1.0000 | ORAL_TABLET | Freq: Four times a day (QID) | ORAL | 0 refills | Status: DC | PRN
  Filled 2022-09-08: qty 120, 30d supply, fill #0

## 2022-07-31 ENCOUNTER — Other Ambulatory Visit: Payer: Self-pay | Admitting: Family Medicine

## 2022-07-31 ENCOUNTER — Ambulatory Visit
Admission: RE | Admit: 2022-07-31 | Discharge: 2022-07-31 | Disposition: A | Discharge status: Home / Self Care | Payer: 59 | Source: Ambulatory Visit | Attending: Family Medicine | Admitting: Family Medicine

## 2022-07-31 DIAGNOSIS — E559 Vitamin D deficiency, unspecified: Secondary | ICD-10-CM

## 2022-07-31 DIAGNOSIS — G4733 Obstructive sleep apnea (adult) (pediatric): Secondary | ICD-10-CM

## 2022-07-31 DIAGNOSIS — Z0000 Encounter for general adult medical examination without abnormal findings: Secondary | ICD-10-CM

## 2022-07-31 DIAGNOSIS — E538 Deficiency of other specified B group vitamins: Secondary | ICD-10-CM

## 2022-07-31 DIAGNOSIS — F341 Dysthymic disorder: Secondary | ICD-10-CM

## 2022-07-31 DIAGNOSIS — E7800 Pure hypercholesterolemia, unspecified: Secondary | ICD-10-CM

## 2022-07-31 DIAGNOSIS — Z1231 Encounter for screening mammogram for malignant neoplasm of breast: Secondary | ICD-10-CM | POA: Diagnosis not present

## 2022-07-31 DIAGNOSIS — R7303 Prediabetes: Secondary | ICD-10-CM

## 2022-07-31 DIAGNOSIS — F17200 Nicotine dependence, unspecified, uncomplicated: Secondary | ICD-10-CM

## 2022-07-31 DIAGNOSIS — E876 Hypokalemia: Secondary | ICD-10-CM

## 2022-07-31 DIAGNOSIS — I10 Essential (primary) hypertension: Secondary | ICD-10-CM

## 2022-07-31 DIAGNOSIS — Z23 Encounter for immunization: Secondary | ICD-10-CM

## 2022-08-06 ENCOUNTER — Other Ambulatory Visit (HOSPITAL_COMMUNITY): Payer: Self-pay

## 2022-09-01 ENCOUNTER — Other Ambulatory Visit (HOSPITAL_COMMUNITY): Payer: Self-pay

## 2022-09-01 MED ORDER — HYDROCODONE-ACETAMINOPHEN 10-325 MG PO TABS
1.0000 | ORAL_TABLET | Freq: Four times a day (QID) | ORAL | 0 refills | Status: DC | PRN
  Filled 2022-11-06: qty 120, 30d supply, fill #0

## 2022-09-01 MED ORDER — HYDROCODONE-ACETAMINOPHEN 10-325 MG PO TABS
1.0000 | ORAL_TABLET | Freq: Four times a day (QID) | ORAL | 0 refills | Status: DC | PRN
  Filled 2022-10-06: qty 120, 30d supply, fill #0

## 2022-09-08 ENCOUNTER — Other Ambulatory Visit (HOSPITAL_COMMUNITY): Payer: Self-pay

## 2022-10-06 ENCOUNTER — Other Ambulatory Visit (HOSPITAL_COMMUNITY): Payer: Self-pay

## 2022-10-19 IMAGING — MG DIGITAL SCREENING BREAST BILAT IMPLANT W/ TOMO W/ CAD
9 of 12 series · 9 of 28 positions shown · non-contrast
Comparison: Previous exam(s).

CLINICAL DATA: Screening.

EXAM:
DIGITAL SCREENING BILATERAL MAMMOGRAM WITH IMPLANTS, CAD AND
TOMOSYNTHESIS
TECHNIQUE: Bilateral screening digital craniocaudal and mediolateral oblique
mammograms were obtained. Bilateral screening digital breast
tomosynthesis was performed. The images were evaluated with
computer-aided detection. Standard and/or implant displaced views
were performed.

[R CC]
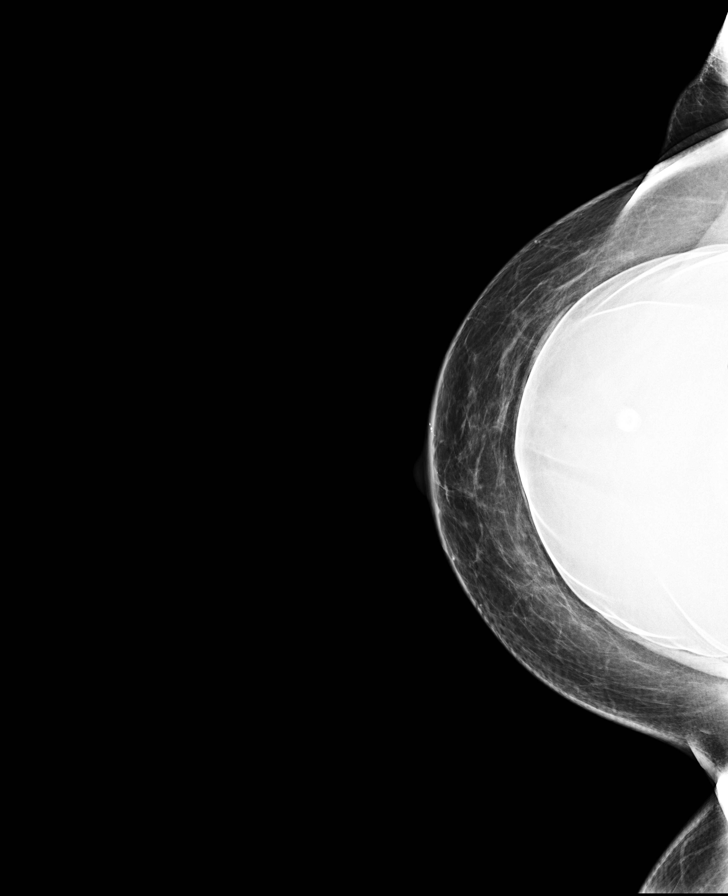

[L CC]
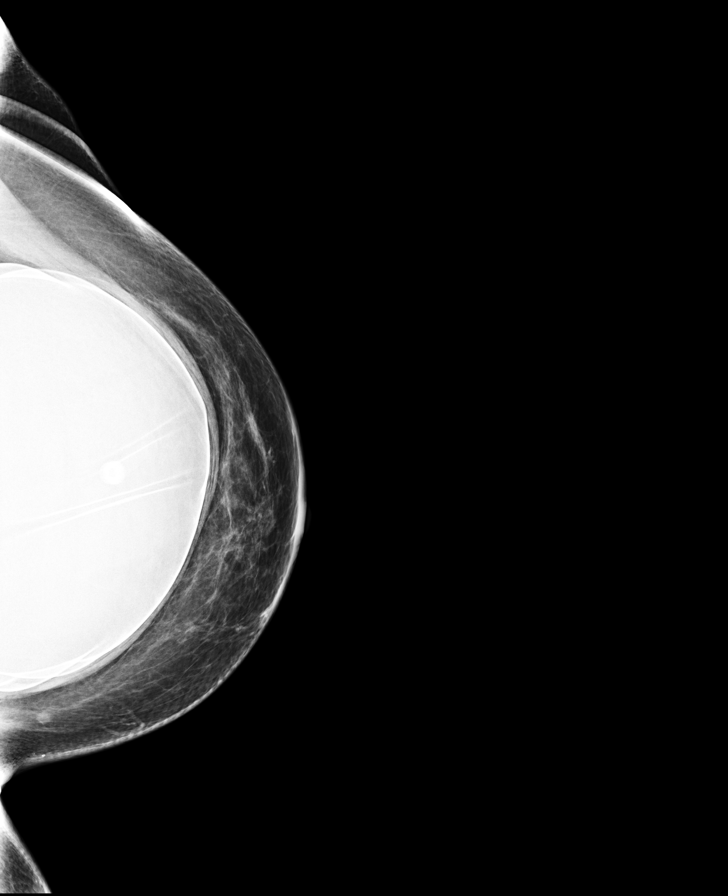

[R MLO]
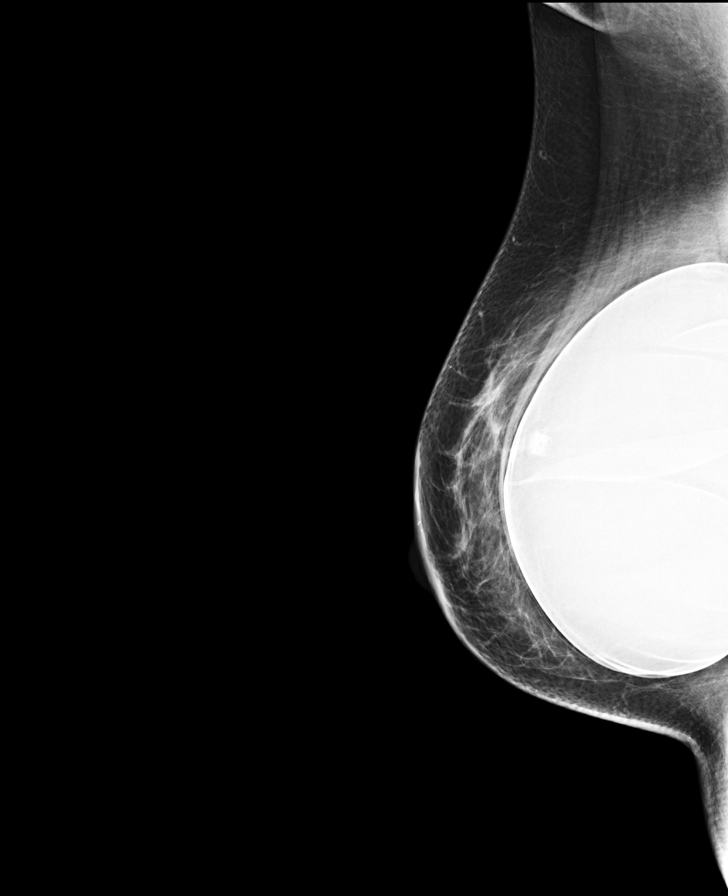

[L MLO]
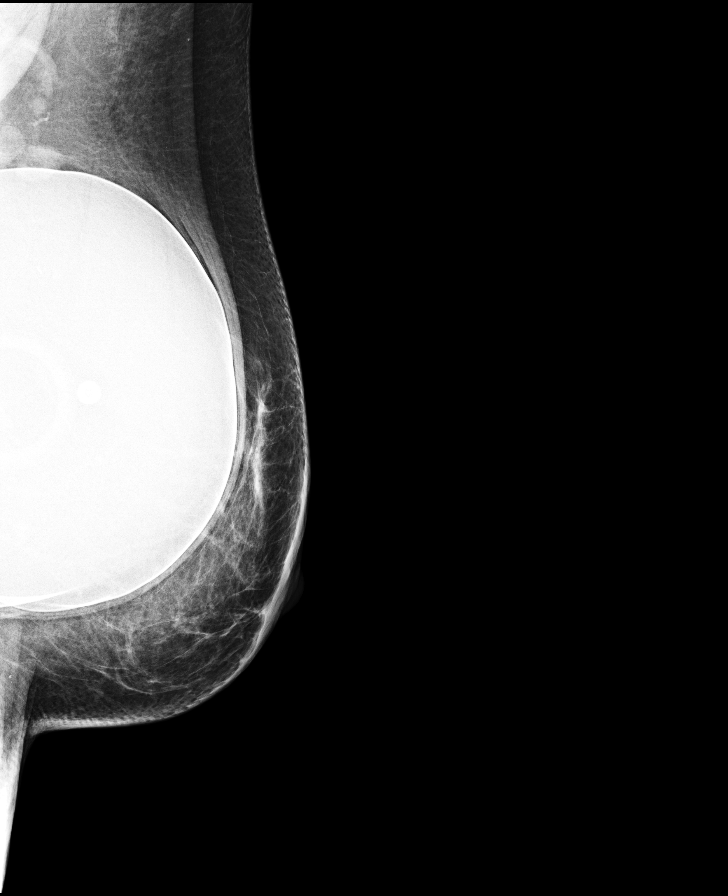

[L CC synth-2D]
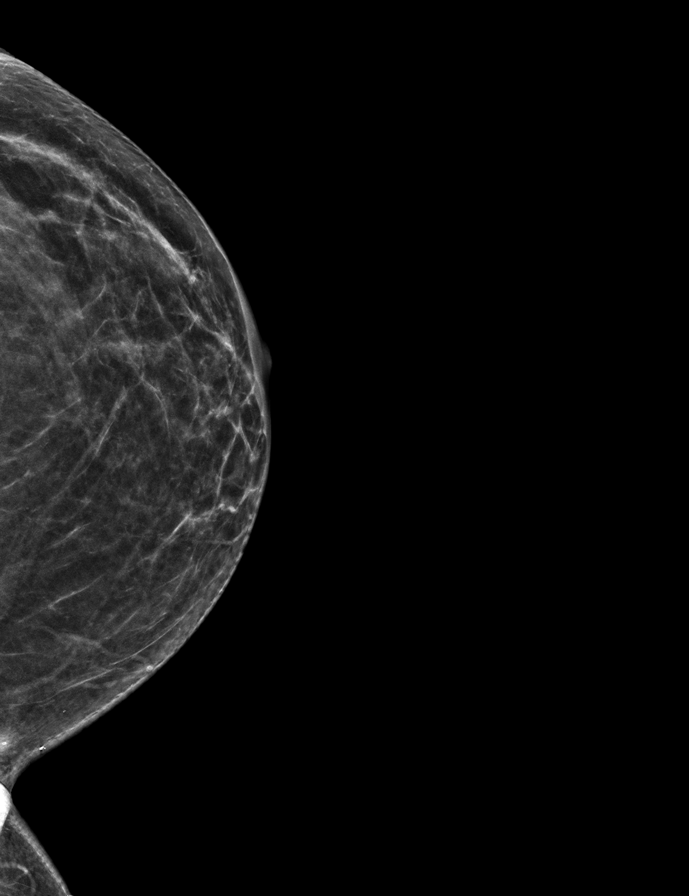

[L MLO synth-2D]
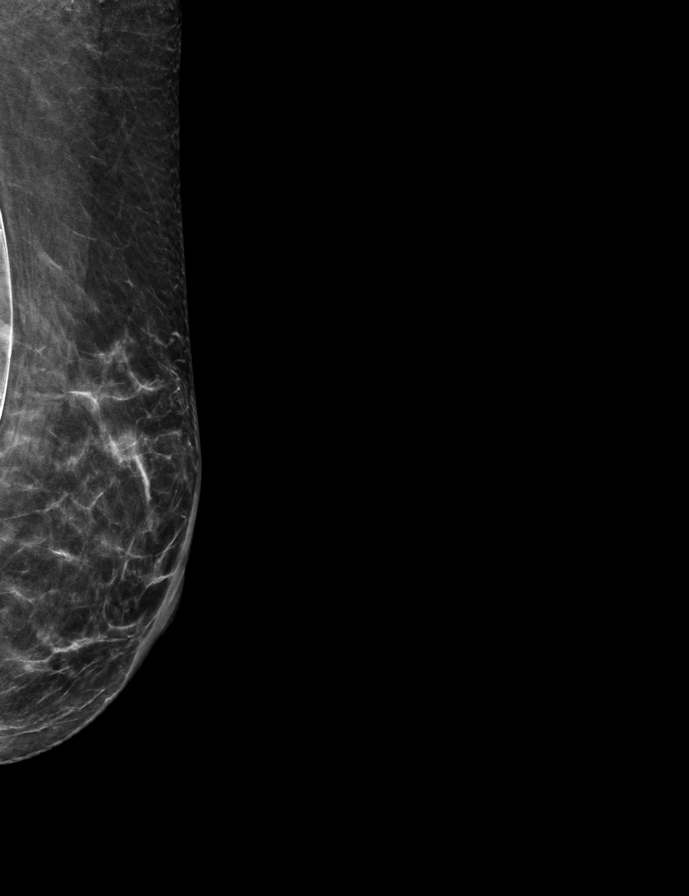

[R MLO synth-2D]
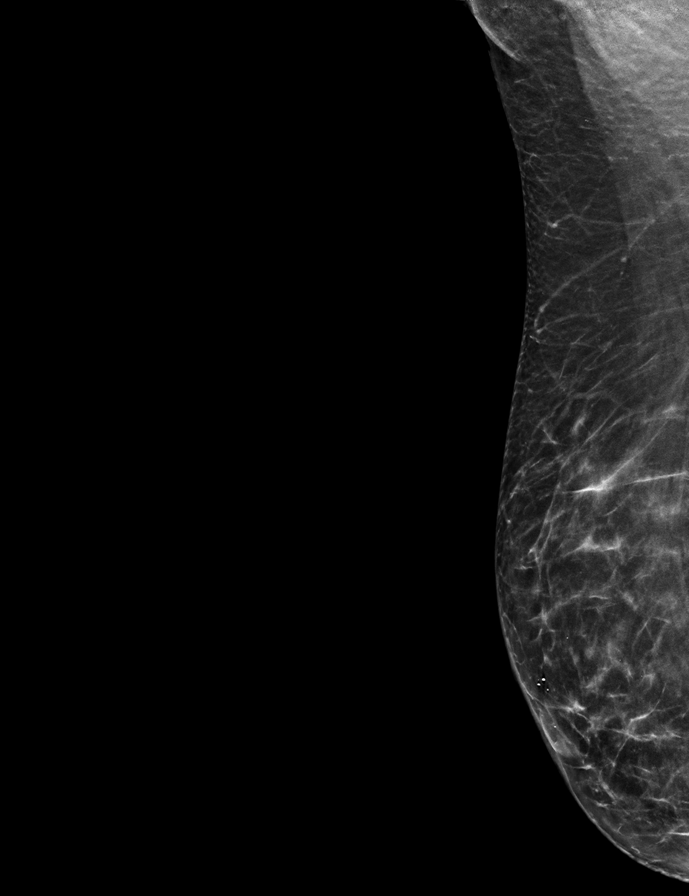

[R CC synth-2D]
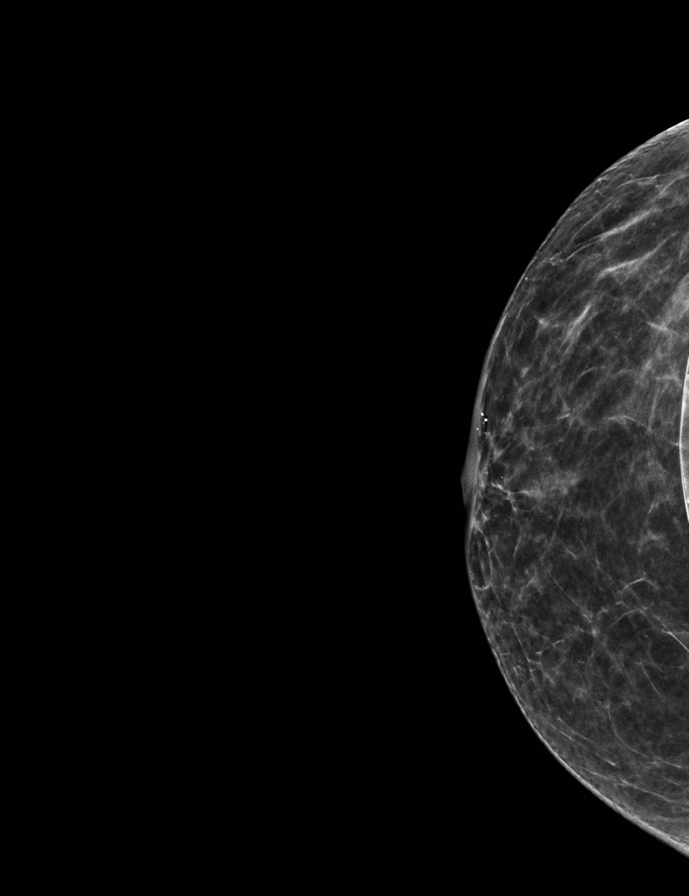

[L CC tomo · tomo slice 25/50.0]
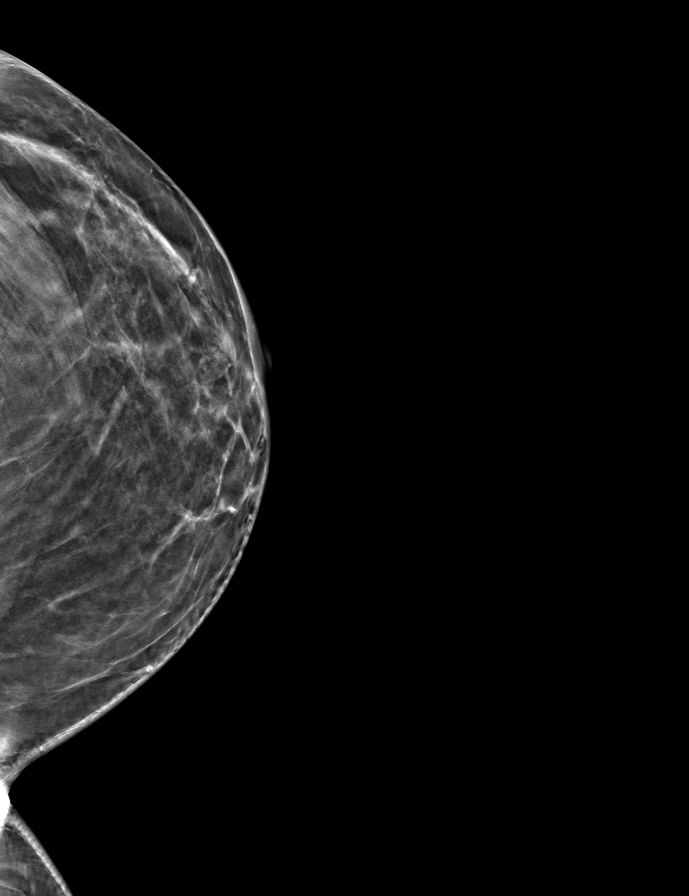

[9 of 28 positions shown; findings below may reference images not displayed]

ACR Breast Density Category b: There are scattered areas of
fibroglandular density.
FINDINGS: The patient has retropectoral implants. There are no findings
suspicious for malignancy.
IMPRESSION: No mammographic evidence of malignancy. A result letter of this
screening mammogram will be mailed directly to the patient.

RECOMMENDATION:
Screening mammogram in one year. (Code:SE-S-JMG)

BI-RADS CATEGORY  1:  Negative.

## 2022-10-22 ENCOUNTER — Other Ambulatory Visit: Payer: Self-pay | Admitting: Family Medicine

## 2022-10-23 MED ORDER — HYDROCHLOROTHIAZIDE 25 MG PO TABS: 25.0000 mg | ORAL_TABLET | Freq: Every day | ORAL | 1 refills | Status: DC

## 2022-10-23 NOTE — Addendum Note (Signed)
Addended by: Shon Millet on: 10/23/2022 11:43 AM   Modules accepted: Orders

## 2022-10-27 ENCOUNTER — Other Ambulatory Visit (HOSPITAL_COMMUNITY): Payer: Self-pay

## 2022-10-27 MED ORDER — HYDROCODONE-ACETAMINOPHEN 10-325 MG PO TABS
1.0000 | ORAL_TABLET | Freq: Four times a day (QID) | ORAL | 0 refills | Status: DC | PRN
  Filled 2022-10-27 – 2022-12-04 (×2): qty 120, 30d supply, fill #0

## 2022-10-27 MED ORDER — CYCLOBENZAPRINE HCL 10 MG PO TABS
10.0000 mg | ORAL_TABLET | Freq: Three times a day (TID) | ORAL | 3 refills | Status: DC
  Filled 2022-10-27: qty 90, 30d supply, fill #0
  Filled 2023-04-07: qty 90, 30d supply, fill #1
  Filled 2023-07-05: qty 90, 30d supply, fill #2
  Filled 2023-08-04: qty 90, 30d supply, fill #3

## 2022-10-27 MED ORDER — HYDROCODONE-ACETAMINOPHEN 10-325 MG PO TABS
1.0000 | ORAL_TABLET | Freq: Four times a day (QID) | ORAL | 0 refills | Status: DC | PRN
  Filled 2023-01-05: qty 120, 30d supply, fill #0

## 2022-10-27 MED ORDER — CLONAZEPAM 0.5 MG PO TABS
0.5000 mg | ORAL_TABLET | Freq: Every evening | ORAL | 3 refills | Status: DC | PRN
  Filled 2022-10-27 – 2022-11-06 (×2): qty 30, 30d supply, fill #0
  Filled 2022-12-04: qty 30, 30d supply, fill #1
  Filled 2023-01-05: qty 30, 30d supply, fill #2
  Filled 2023-02-05: qty 30, 30d supply, fill #3

## 2022-11-06 ENCOUNTER — Other Ambulatory Visit (HOSPITAL_COMMUNITY): Payer: Self-pay

## 2022-12-04 ENCOUNTER — Other Ambulatory Visit (HOSPITAL_COMMUNITY): Payer: Self-pay

## 2022-12-05 ENCOUNTER — Other Ambulatory Visit (HOSPITAL_COMMUNITY): Payer: Self-pay

## 2022-12-22 ENCOUNTER — Other Ambulatory Visit (HOSPITAL_COMMUNITY): Payer: Self-pay

## 2022-12-22 MED ORDER — HYDROCODONE-ACETAMINOPHEN 10-325 MG PO TABS
1.0000 | ORAL_TABLET | Freq: Four times a day (QID) | ORAL | 0 refills | Status: DC | PRN
  Filled 2023-03-08: qty 120, 30d supply, fill #0

## 2022-12-22 MED ORDER — HYDROCODONE-ACETAMINOPHEN 10-325 MG PO TABS
1.0000 | ORAL_TABLET | Freq: Four times a day (QID) | ORAL | 0 refills | Status: DC | PRN
  Filled 2023-02-05: qty 120, 30d supply, fill #0

## 2022-12-22 MED ORDER — NALOXONE HCL 4 MG/0.1ML NA LIQD
NASAL | 1 refills | Status: DC
  Filled 2022-12-22: qty 2, 2d supply, fill #0

## 2023-01-01 ENCOUNTER — Other Ambulatory Visit (HOSPITAL_COMMUNITY): Payer: Self-pay

## 2023-01-05 ENCOUNTER — Other Ambulatory Visit (HOSPITAL_COMMUNITY): Payer: Self-pay

## 2023-02-05 ENCOUNTER — Other Ambulatory Visit (HOSPITAL_COMMUNITY): Payer: Self-pay

## 2023-02-16 ENCOUNTER — Other Ambulatory Visit (HOSPITAL_COMMUNITY): Payer: Self-pay

## 2023-02-16 MED ORDER — CYCLOBENZAPRINE HCL 10 MG PO TABS
10.0000 mg | ORAL_TABLET | Freq: Three times a day (TID) | ORAL | 3 refills | Status: DC
  Filled 2023-02-16 – 2023-05-06 (×2): qty 90, 30d supply, fill #0
  Filled 2023-06-03: qty 90, 30d supply, fill #1

## 2023-02-16 MED ORDER — HYDROCODONE-ACETAMINOPHEN 10-325 MG PO TABS
1.0000 | ORAL_TABLET | Freq: Four times a day (QID) | ORAL | 0 refills | Status: DC | PRN
  Filled 2023-02-16 – 2023-04-07 (×2): qty 120, 30d supply, fill #0

## 2023-02-16 MED ORDER — CLONAZEPAM 0.5 MG PO TABS
0.5000 mg | ORAL_TABLET | Freq: Every evening | ORAL | 3 refills | Status: DC | PRN
  Filled 2023-03-08: qty 30, 30d supply, fill #0
  Filled 2023-04-07: qty 30, 30d supply, fill #1
  Filled 2023-05-06: qty 30, 30d supply, fill #2
  Filled 2023-06-03: qty 30, 30d supply, fill #3
  Filled 2023-06-03: qty 25, 25d supply, fill #3

## 2023-02-16 MED ORDER — HYDROCODONE-ACETAMINOPHEN 10-325 MG PO TABS: 1.0000 | ORAL_TABLET | Freq: Four times a day (QID) | ORAL | 0 refills | Status: DC | PRN

## 2023-02-27 ENCOUNTER — Other Ambulatory Visit (HOSPITAL_COMMUNITY): Payer: Self-pay

## 2023-03-08 ENCOUNTER — Other Ambulatory Visit (HOSPITAL_COMMUNITY): Payer: Self-pay

## 2023-03-28 ENCOUNTER — Telehealth: Payer: Self-pay | Admitting: Family Medicine

## 2023-03-28 DIAGNOSIS — I10 Essential (primary) hypertension: Secondary | ICD-10-CM

## 2023-03-28 DIAGNOSIS — E559 Vitamin D deficiency, unspecified: Secondary | ICD-10-CM

## 2023-03-28 DIAGNOSIS — R7303 Prediabetes: Secondary | ICD-10-CM

## 2023-03-28 DIAGNOSIS — E7800 Pure hypercholesterolemia, unspecified: Secondary | ICD-10-CM

## 2023-03-28 DIAGNOSIS — E538 Deficiency of other specified B group vitamins: Secondary | ICD-10-CM

## 2023-03-28 NOTE — Telephone Encounter (Signed)
-----   Message from Alvina Chou sent at 03/15/2023  3:43 PM EST ----- Regarding: Lab orders for Tue, 1.14.25 Patient is scheduled for CPX labs, please order future labs, Thanks , Camelia Eng

## 2023-03-30 ENCOUNTER — Other Ambulatory Visit: Payer: 59

## 2023-04-06 ENCOUNTER — Other Ambulatory Visit: Payer: Self-pay | Admitting: Family Medicine

## 2023-04-06 ENCOUNTER — Ambulatory Visit (INDEPENDENT_AMBULATORY_CARE_PROVIDER_SITE_OTHER): Payer: 59 | Admitting: Family Medicine

## 2023-04-06 ENCOUNTER — Encounter: Payer: Self-pay | Admitting: Family Medicine

## 2023-04-06 VITALS — BP 132/85 | HR 75 | Temp 97.9°F | Ht 64.25 in | Wt 169.2 lb

## 2023-04-06 DIAGNOSIS — R7303 Prediabetes: Secondary | ICD-10-CM | POA: Diagnosis not present

## 2023-04-06 DIAGNOSIS — Z23 Encounter for immunization: Secondary | ICD-10-CM | POA: Diagnosis not present

## 2023-04-06 DIAGNOSIS — Z0000 Encounter for general adult medical examination without abnormal findings: Secondary | ICD-10-CM

## 2023-04-06 DIAGNOSIS — I10 Essential (primary) hypertension: Secondary | ICD-10-CM | POA: Diagnosis not present

## 2023-04-06 DIAGNOSIS — E559 Vitamin D deficiency, unspecified: Secondary | ICD-10-CM

## 2023-04-06 DIAGNOSIS — G4733 Obstructive sleep apnea (adult) (pediatric): Secondary | ICD-10-CM

## 2023-04-06 DIAGNOSIS — E7800 Pure hypercholesterolemia, unspecified: Secondary | ICD-10-CM | POA: Diagnosis not present

## 2023-04-06 DIAGNOSIS — E876 Hypokalemia: Secondary | ICD-10-CM

## 2023-04-06 DIAGNOSIS — N390 Urinary tract infection, site not specified: Secondary | ICD-10-CM

## 2023-04-06 DIAGNOSIS — G8929 Other chronic pain: Secondary | ICD-10-CM | POA: Insufficient documentation

## 2023-04-06 DIAGNOSIS — E538 Deficiency of other specified B group vitamins: Secondary | ICD-10-CM

## 2023-04-06 DIAGNOSIS — F341 Dysthymic disorder: Secondary | ICD-10-CM

## 2023-04-06 DIAGNOSIS — F17200 Nicotine dependence, unspecified, uncomplicated: Secondary | ICD-10-CM

## 2023-04-06 MED ORDER — CYANOCOBALAMIN 1000 MCG/ML IJ SOLN: INTRAMUSCULAR | 3 refills | Status: AC

## 2023-04-06 MED ORDER — HYDROCHLOROTHIAZIDE 25 MG PO TABS: 25.0000 mg | ORAL_TABLET | Freq: Every day | ORAL | 3 refills | Status: DC

## 2023-04-06 MED ORDER — ROSUVASTATIN CALCIUM 10 MG PO TABS: 10.0000 mg | ORAL_TABLET | Freq: Every day | ORAL | 3 refills | Status: AC

## 2023-04-06 MED ORDER — METOPROLOL SUCCINATE ER 25 MG PO TB24: 25.0000 mg | ORAL_TABLET | Freq: Every day | ORAL | 3 refills | Status: DC

## 2023-04-06 NOTE — Progress Notes (Signed)
Subjective:    Patient ID: Desiree Walters, female    DOB: October 10, 1970, 53 y.o.   MRN: [id number]  HPI  Here for health maintenance exam and to review chronic medical problems   Wt Readings from Last 3 Encounters:  04/06/23 169 lb 4 oz (76.8 kg)  04/03/22 158 lb 4 oz (71.8 kg)  03/25/21 175 lb 2 oz (79.4 kg)   28.83 kg/m  Vitals:   04/06/23 0905 04/06/23 0933  BP: (!) 148/92 132/85  Pulse: 75   Temp: 97.9 F (36.6 C)   SpO2: 99%     Immunization History  Administered Date(s) Administered   Influenza Split 11/28/2012, 12/14/2013   Influenza Whole 12/15/2006   Influenza, Seasonal, Injecte, Preservative Fre 04/06/2023   Influenza,inj,Quad PF,6+ Mos 12/21/2016, 02/18/2018, 12/16/2018, 03/25/2021, 04/03/2022   Influenza-Unspecified 11/16/2014, 12/31/2019   PFIZER(Purple Top)SARS-COV-2 Vaccination 03/31/2019, 05/01/2019, 12/31/2019   PNEUMOCOCCAL CONJUGATE-20 04/06/2023   Td 07/29/2007   Tdap 12/21/2016    Health Maintenance Due  Topic Date Due   Zoster Vaccines- Shingrix (1 of 2) Never done   Flu shot -needs today   Pna vaccine -interested   Working long hours   Shingrix -considering   Mammogram 07/2022  Self breast exam- no lumps   Gyn health- hysterectomy    Colon cancer screening  -colonoscopy 04/2020 with a polyp   , 7 y recall   Bone health   Falls-none  Fractures-none  Supplements ca and D today Last vitamin D Lab Results  Component Value Date   VD25OH 28 (L) 04/03/2022    Exercise :  Not regular    Smoking status : 1 or less packs per day    Derm care  Has not been back since stopping accuatne  Is careful now with sun    Mood    04/03/2022    5:15 PM 03/25/2021   12:53 PM 03/06/2020   10:53 AM 03/03/2019    9:31 AM 02/18/2018   11:51 AM  Depression screen PHQ 2/9  Decreased Interest 0 0 1 0 2  Down, Depressed, Hopeless 1 0 0 2 0  PHQ - 2 Score 1 0 1 2 2   Altered sleeping 2 2 1 1  0  Tired, decreased energy 2 1 2  0 2  Change  in appetite 1 1 2  0 3  Feeling bad or failure about yourself  0 0 0 0 1  Trouble concentrating 1 2 3  0 3  Moving slowly or fidgety/restless 0 0 0 0 2  Suicidal thoughts 0 0 0 0 0  PHQ-9 Score 7 6 9 3 13   Difficult doing work/chores Somewhat difficult Not difficult at all Not difficult at all Not difficult at all   History of dysthymia and anxiety and ocd  Klonopin 0.5 mg daily at bedtime  Doing ok overall  No longer on wellbutrin  Sees psychiatry    HTN bp is stable today  No cp or palpitations or headaches or edema  No side effects to medicines  BP Readings from Last 3 Encounters:  04/06/23 132/85  04/03/22 128/82  03/25/21 126/82   Hydrochlorothiazide 25 mg daily  Metoprolol xl 50 mg daily   Lab Results  Component Value Date   NA 142 04/03/2022   K 3.8 04/03/2022   CO2 26 04/03/2022   GLUCOSE 96 04/03/2022   BUN 12 04/03/2022   CREATININE 0.59 04/03/2022   CALCIUM 9.5 04/03/2022   GFR 92.01 07/12/2020   GFRNONAA >60 07/07/2020   Due for  labs   Takes trimethoprim 100 mg daily for uti proph    OSA  Hyperlipidemia Lab Results  Component Value Date   CHOL 136 04/03/2022   HDL 48 (L) 04/03/2022   LDLCALC 67 04/03/2022   LDLDIRECT 159.0 02/18/2018   TRIG 119 04/03/2022   CHOLHDL 2.8 04/03/2022  Rosuvastatin 10 mg daily  Prediabetes  Lab Results  Component Value Date   HGBA1C 5.9 (H) 04/03/2022   HGBA1C 6.0 (H) 03/25/2021   HGBA1C 6.0 (H) 03/06/2020   Due for labs   B12 def  Injecttions monthly at home (closer to every other)  Watches for tingling   Patient Active Problem List   Diagnosis Date Noted   Chronic back pain 04/06/2023   Routine general medical examination at a health care facility 03/25/2021   Recurrent UTI 07/12/2020   Hypokalemia 07/12/2020   Dysthymia 03/03/2019   Vitamin D deficiency 03/03/2019   Screening mammogram, encounter for 02/18/2018   Migraine without aura 01/21/2017   Menopausal syndrome (hot flashes) 05/09/2014    Encounter for screening mammogram for breast cancer 05/09/2014   Vitamin B12 deficiency 10/09/2009   Hyperlipidemia 10/09/2009   Prediabetes 10/09/2009   HYPERSOMNIA 09/26/2007   Obstructive sleep apnea 08/20/2006   TOBACCO ABUSE 08/04/2006   Essential hypertension 08/04/2006   OBSESSIVE-COMPULSIVE DISORDER 07/26/2006   FIBROMYALGIA 07/26/2006   Past Medical History:  Diagnosis Date   Asthma    Diabetes mellitus    Fatty liver    Fibromyalgia    Hyperlipemia    Hypersomnia    NPSG 08-25-07-1.2/hr   Obesity    OCD (obsessive compulsive disorder)    Pernicious anemia    Sleep apnea    uses CPAP   Tobacco abuse    Past Surgical History:  Procedure Laterality Date   ABDOMINAL HYSTERECTOMY     AUGMENTATION MAMMAPLASTY Bilateral    BREAST ENHANCEMENT SURGERY  2003   FOOT SURGERY  2005; 2008   IUD - Merina  2008   KNEE SURGERY Left    NASAL SEPTUM SURGERY     Social History   Tobacco Use   Smoking status: Every Day    Current packs/day: 0.50    Average packs/day: 0.5 packs/day for 20.0 years (10.0 ttl pk-yrs)    Types: Cigarettes   Smokeless tobacco: Never  Vaping Use   Vaping status: Never Used  Substance Use Topics   Alcohol use: Not Currently   Drug use: No   Family History  Problem Relation Age of Onset   Hypertension Father    Coronary artery disease Father    Sleep apnea Father        with CPAP   Heart disease Father        CAD   Hyperlipidemia Father    Hypertension Mother    Hyperlipidemia Mother    Hypertension Sister    Hyperlipidemia Other        aunts/uncles   Coronary artery disease Other        aunts/uncles   Stroke Maternal Grandmother    Multiple sclerosis Neg Hx    Colon cancer Neg Hx    Pancreatic cancer Neg Hx    Esophageal cancer Neg Hx    Breast cancer Neg Hx    Colon polyps Neg Hx    Stomach cancer Neg Hx    Rectal cancer Neg Hx    Allergies  Allergen Reactions   Ciprofloxacin     REACTION: reaction not known    Levofloxacin  REACTION: reaction not known   Quinolones     Other reaction(s): Unknown   Varenicline Tartrate     REACTION: headache   Current Outpatient Medications on File Prior to Visit  Medication Sig Dispense Refill   clonazePAM (KLONOPIN) 0.5 MG tablet Take 1 tablet by mouth at bedtime.     clonazePAM (KLONOPIN) 0.5 MG tablet Take 1 tablet (0.5 mg total) by mouth every evening as needed 30 tablet 3   clonazePAM (KLONOPIN) 0.5 MG tablet Take 1 tablet (0.5 mg) by mouth at bedtime as needed. 30 tablet 3   clonazePAM (KLONOPIN) 0.5 MG tablet Take 1 tablet (0.5 mg total) by mouth every night as needed. 30 tablet 3   cyclobenzaprine (FLEXERIL) 10 MG tablet Take 1 tablet by mouth 3 (three) times daily as needed.     cyclobenzaprine (FLEXERIL) 10 MG tablet Take 1 tablet (10 mg total) by mouth 3 (three) times daily. 90 tablet 3   cyclobenzaprine (FLEXERIL) 10 MG tablet Take 1 tablet by mouth 3 times a day 90 tablet 3   cyclobenzaprine (FLEXERIL) 10 MG tablet Take 1 tablet by mouth three times a day 90 tablet 3   estradiol (ESTRACE) 0.1 MG/GM vaginal cream PLEASE SEE ATTACHED FOR DETAILED DIRECTIONS     HYDROcodone-acetaminophen (NORCO) 10-325 MG per tablet Take 1 tablet by mouth every 4 (four) hours as needed.     HYDROcodone-acetaminophen (NORCO) 10-325 MG tablet Take 1 tablet by mouth every 6 (six) hours as needed for pain 120 tablet 0   HYDROcodone-acetaminophen (NORCO) 10-325 MG tablet Take 1 tablet by mouth every six hours as needed for pain 120 tablet 0   HYDROcodone-acetaminophen (NORCO) 10-325 MG tablet Take 1 tablet by mouth every six hours as needed for pain 120 tablet 0   HYDROcodone-acetaminophen (NORCO) 10-325 MG tablet Take 1 tablet by mouth every 6 (six) hours as needed for pain 120 tablet 0   HYDROcodone-acetaminophen (NORCO) 10-325 MG tablet Take 1 tablet by mouth every 6 hours as needed for pain (fill 04/16/22) 120 tablet 0   HYDROcodone-acetaminophen (NORCO) 10-325 MG tablet  Take 1 tablet by mouth every 6 (six) hours as needed for pain. 120 tablet 0   HYDROcodone-acetaminophen (NORCO) 10-325 MG tablet Take 1 tablet by mouth every 6 hours as needed for pain 120 tablet 0   HYDROcodone-acetaminophen (NORCO) 10-325 MG tablet Take 1 tablet by mouth every 6 (six) hours as needed for pain 120 tablet 0   HYDROcodone-acetaminophen (NORCO) 10-325 MG tablet Take 1 tablet by mouth every 6 (six) hours as needed for pain 120 tablet 0   HYDROcodone-acetaminophen (NORCO) 10-325 MG tablet Take 1 tablet by mouth every 6 (six) hours as needed for pain. 120 tablet 0   HYDROcodone-acetaminophen (NORCO) 10-325 MG tablet Take 1 tablet by mouth every 6 (six) hours as needed for pain. 120 tablet 0   HYDROcodone-acetaminophen (NORCO) 10-325 MG tablet Take 1 tablet by mouth every 6 hours as needed for pain 120 tablet 0   HYDROcodone-acetaminophen (NORCO) 10-325 MG tablet Take 1 tablet by mouth every 6 (six) hours as needed for pain 120 tablet 0   HYDROcodone-acetaminophen (NORCO) 10-325 MG tablet Take 1 tablet by mouth every 6 (six) hours as needed for pain. 120 tablet 0   HYDROcodone-acetaminophen (NORCO) 10-325 MG tablet Take 1 tablet by mouth every 6 (six) hours as needed for pain. 120 tablet 0   HYDROcodone-acetaminophen (NORCO) 10-325 MG tablet Take 1 tablet by mouth every 6 (six) hours as needed for pain  120 tablet 0   HYDROcodone-acetaminophen (NORCO) 10-325 MG tablet Take 1 tablet by mouth every 6 (six) hours as needed for pain 120 tablet 0   ISOtretinoin (ACCUTANE PO) Take by mouth.     naloxone (NARCAN) nasal spray 4 mg/0.1 mL USe 1 spray into one nostril single dose as needed may repeat dose every 2-3 minutes until patient responsive or EMS arrives 1 each 1   trimethoprim (TRIMPEX) 100 MG tablet Take 100 mg by mouth daily.     No current facility-administered medications on file prior to visit.    Review of Systems  Constitutional:  Positive for fatigue. Negative for activity  change, appetite change, fever and unexpected weight change.  HENT:  Negative for congestion, ear pain, rhinorrhea, sinus pressure and sore throat.   Eyes:  Negative for pain, redness and visual disturbance.  Respiratory:  Negative for cough, shortness of breath and wheezing.   Cardiovascular:  Negative for chest pain and palpitations.  Gastrointestinal:  Negative for abdominal pain, blood in stool, constipation and diarrhea.  Endocrine: Negative for polydipsia and polyuria.  Genitourinary:  Negative for dysuria, frequency and urgency.  Musculoskeletal:  Positive for arthralgias and back pain. Negative for myalgias.  Skin:  Negative for pallor and rash.  Allergic/Immunologic: Negative for environmental allergies.  Neurological:  Negative for dizziness, syncope and headaches.  Hematological:  Negative for adenopathy. Does not bruise/bleed easily.  Psychiatric/Behavioral:  Negative for decreased concentration and dysphoric mood. The patient is not nervous/anxious.        Mood is fair        Objective:   Physical Exam Constitutional:      General: She is not in acute distress.    Appearance: Normal appearance. She is well-developed and normal weight. She is not ill-appearing or diaphoretic.  HENT:     Head: Normocephalic and atraumatic.     Right Ear: Tympanic membrane, ear canal and external ear normal.     Left Ear: Tympanic membrane, ear canal and external ear normal.     Nose: Nose normal. No congestion.     Mouth/Throat:     Mouth: Mucous membranes are moist.     Pharynx: Oropharynx is clear. No posterior oropharyngeal erythema.  Eyes:     General: No scleral icterus.    Extraocular Movements: Extraocular movements intact.     Conjunctiva/sclera: Conjunctivae normal.     Pupils: Pupils are equal, round, and reactive to light.  Neck:     Thyroid: No thyromegaly.     Vascular: No carotid bruit or JVD.  Cardiovascular:     Rate and Rhythm: Normal rate and regular rhythm.      Pulses: Normal pulses.     Heart sounds: Normal heart sounds.     No gallop.  Pulmonary:     Effort: Pulmonary effort is normal. No respiratory distress.     Breath sounds: Normal breath sounds. No wheezing.     Comments: Good air exch Chest:     Chest wall: No tenderness.  Abdominal:     General: Bowel sounds are normal. There is no distension or abdominal bruit.     Palpations: Abdomen is soft. There is no mass.     Tenderness: There is no abdominal tenderness.     Hernia: No hernia is present.  Genitourinary:    Comments: Breast exam: No mass, nodules, thickening, tenderness, bulging, retraction, inflamation, nipple discharge or skin changes noted.  No axillary or clavicular LA.     Musculoskeletal:  General: No tenderness. Normal range of motion.     Cervical back: Normal range of motion and neck supple. No rigidity. No muscular tenderness.     Right lower leg: No edema.     Left lower leg: No edema.     Comments: No kyphosis   Lymphadenopathy:     Cervical: No cervical adenopathy.  Skin:    General: Skin is warm and dry.     Coloration: Skin is not pale.     Findings: No erythema or rash.     Comments: Solar lentigines diffusely   Neurological:     Mental Status: She is alert. Mental status is at baseline.     Cranial Nerves: No cranial nerve deficit.     Motor: No abnormal muscle tone.     Coordination: Coordination normal.     Gait: Gait normal.     Deep Tendon Reflexes: Reflexes are normal and symmetric. Reflexes normal.  Psychiatric:        Mood and Affect: Mood normal.        Cognition and Memory: Cognition and memory normal.           Assessment & Plan:   Problem List Items Addressed This Visit       Cardiovascular and Mediastinum   Essential hypertension   bp in fair control at this time  BP Readings from Last 1 Encounters:  04/06/23 132/85   No changes needed Most recent labs reviewed  Disc lifstyle change with low sodium diet and  exercise  Plan to continue hctz 25 mg daily and metoprolol xl 50 mg daily   Lab today      Relevant Medications   hydrochlorothiazide (HYDRODIURIL) 25 MG tablet   metoprolol succinate (TOPROL-XL) 25 MG 24 hr tablet   rosuvastatin (CRESTOR) 10 MG tablet   Other Relevant Orders   CBC with Differential/Platelet   Comprehensive metabolic panel   TSH     Respiratory   Obstructive sleep apnea   Encouraged to continue cpap and work on weight loss       Relevant Orders   CBC with Differential/Platelet   Comprehensive metabolic panel   Lipid Panel   TSH   Vitamin B12   VITAMIN D 25 Hydroxy (Vit-D Deficiency, Fractures)   Hemoglobin A1c     Genitourinary   Recurrent UTI   Continues trimethoprim prophylaxis from urology Has had a cysto       Relevant Medications   trimethoprim (TRIMPEX) 100 MG tablet     Other   Vitamin D deficiency   D level today Taking ca plus D  Discussed importance to bone and overall health       Relevant Orders   VITAMIN D 25 Hydroxy (Vit-D Deficiency, Fractures)   Vitamin B12 deficiency   B12 today  Shots at home-ordered monthly/pt averages every other month       Relevant Orders   Vitamin B12   TOBACCO ABUSE   Disc in detail risks of smoking and possible outcomes including copd, vascular/ heart disease, cancer , respiratory and sinus infections as well as osteoporosis  Pt voices understanding   Not ready to quit but getting closer  Cannot take chantix because it affects mood  May consider nicotine replacement         Relevant Orders   CBC with Differential/Platelet   Comprehensive metabolic panel   Lipid Panel   TSH   Vitamin B12   VITAMIN D 25 Hydroxy (Vit-D Deficiency, Fractures)  Hemoglobin A1c   Routine general medical examination at a health care facility - Primary   Reviewed health habits including diet and exercise and skin cancer prevention Reviewed appropriate screening tests for age  Also reviewed health mt list,  fam hx and immunization status , as well as social and family history   See HPI Labs reviewed and ordered Flu and prevnar 20  shots given today  Considering shingrix in future  Mammo/colonoscopy utd  Encouraged pt to consider est with dermatology for skin cancer screening, and to continue sun protection (a lot of sun exp in past) Discussed fall prevention, supplements and exercise for bone density   Mental health-stable Health Maintenance  Topic Date Due   Zoster (Shingles) Vaccine (1 of 2) Never done   COVID-19 Vaccine (4 - 2024-25 season) 04/21/2024*   Mammogram  07/31/2023   DTaP/Tdap/Td vaccine (3 - Td or Tdap) 12/22/2026   Colon Cancer Screening  05/11/2027   Pneumococcal Vaccination  Completed   Flu Shot  Completed   Hepatitis C Screening  Completed   HIV Screening  Completed   HPV Vaccine  Aged Out  *Topic was postponed. The date shown is not the original due date.   Labs today       Relevant Orders   CBC with Differential/Platelet   Comprehensive metabolic panel   Lipid Panel   TSH   Vitamin B12   VITAMIN D 25 Hydroxy (Vit-D Deficiency, Fractures)   Hemoglobin A1c   Prediabetes   A1c ordered disc imp of low glycemic diet and wt loss to prevent DM2       Relevant Orders   Lipid Panel   TSH   Hemoglobin A1c   Hypokalemia   Lab today  Lab Results  Component Value Date   K 3.8 04/03/2022   Was normal last time      Relevant Orders   Comprehensive metabolic panel   TSH   Hyperlipidemia   Disc goals for lipids and reasons to control them Rev last labs with pt Rev low sat fat diet in detail Labs today  Continues crestor 10 mg daily and diet       Relevant Medications   hydrochlorothiazide (HYDRODIURIL) 25 MG tablet   metoprolol succinate (TOPROL-XL) 25 MG 24 hr tablet   rosuvastatin (CRESTOR) 10 MG tablet   Other Relevant Orders   Lipid Panel   TSH   Dysthymia   Now off wellbutrin -caused irritability/anxiety ? Also has OCD   Takes klonopin  from Dr Manon Hilding (who takes care of her pain)       Chronic back pain   Sees Dr Manon Hilding for pain management  Norco 10-325 Has narcan on hand   Encouraged exercise as tolerated Has done PT in past       Other Visit Diagnoses       Need for influenza vaccination       Relevant Orders   Flu vaccine trivalent PF, 6mos and older(Flulaval,Afluria,Fluarix,Fluzone) (Completed)     Need for pneumococcal 20-valent conjugate vaccination       Relevant Orders   Pneumococcal conjugate vaccine 20-valent (Prevnar 20) (Completed)

## 2023-04-06 NOTE — Assessment & Plan Note (Signed)
Sees Dr Manon Hilding for pain management  Norco 10-325 Has narcan on hand   Encouraged exercise as tolerated Has done PT in past

## 2023-04-06 NOTE — Assessment & Plan Note (Signed)
A1c ordered disc imp of low glycemic diet and wt loss to prevent DM2  

## 2023-04-06 NOTE — Assessment & Plan Note (Signed)
D level today Taking ca plus D  Discussed importance to bone and overall health

## 2023-04-06 NOTE — Assessment & Plan Note (Signed)
Disc goals for lipids and reasons to control them Rev last labs with pt Rev low sat fat diet in detail Labs today  Continues crestor 10 mg daily and diet

## 2023-04-06 NOTE — Assessment & Plan Note (Signed)
Lab today  Lab Results  Component Value Date   K 3.8 04/03/2022   Was normal last time

## 2023-04-06 NOTE — Assessment & Plan Note (Addendum)
Disc in detail risks of smoking and possible outcomes including copd, vascular/ heart disease, cancer , respiratory and sinus infections as well as osteoporosis  Pt voices understanding   Not ready to quit but getting closer  Cannot take chantix because it affects mood  May consider nicotine replacement

## 2023-04-06 NOTE — Assessment & Plan Note (Addendum)
Now off wellbutrin -caused irritability/anxiety ? Also has OCD   Takes klonopin from Dr Manon Hilding (who takes care of her pain)

## 2023-04-06 NOTE — Patient Instructions (Addendum)
Flu shot today  Prevnar 20 vaccine (pneumonia shot) today    Call your ins co and find out if a dexa is covered since you smoke    Stay active  Add some strength training to your routine, this is important for bone and brain health and can reduce your risk of falls and help your body use insulin properly and regulate weight  Light weights, exercise bands , and internet videos are a good way to start  Yoga (chair or regular), machines , floor exercises or a gym with machines are also good options    Please think about quitting smoking   Make a dermatology visit for skin screening   Labs today

## 2023-04-06 NOTE — Assessment & Plan Note (Signed)
Encouraged to continue cpap and work on weight loss

## 2023-04-06 NOTE — Assessment & Plan Note (Addendum)
Continues trimethoprim prophylaxis from urology Has had a cysto

## 2023-04-06 NOTE — Assessment & Plan Note (Signed)
B12 today  Shots at home-ordered monthly/pt averages every other month

## 2023-04-06 NOTE — Assessment & Plan Note (Signed)
bp in fair control at this time  BP Readings from Last 1 Encounters:  04/06/23 132/85   No changes needed Most recent labs reviewed  Disc lifstyle change with low sodium diet and exercise  Plan to continue hctz 25 mg daily and metoprolol xl 50 mg daily   Lab today

## 2023-04-06 NOTE — Assessment & Plan Note (Addendum)
Reviewed health habits including diet and exercise and skin cancer prevention Reviewed appropriate screening tests for age  Also reviewed health mt list, fam hx and immunization status , as well as social and family history   See HPI Labs reviewed and ordered Flu and prevnar 20  shots given today  Considering shingrix in future  Mammo/colonoscopy utd  Encouraged pt to consider est with dermatology for skin cancer screening, and to continue sun protection (a lot of sun exp in past) Discussed fall prevention, supplements and exercise for bone density   Mental health-stable Health Maintenance  Topic Date Due   Zoster (Shingles) Vaccine (1 of 2) Never done   COVID-19 Vaccine (4 - 2024-25 season) 04/21/2024*   Mammogram  07/31/2023   DTaP/Tdap/Td vaccine (3 - Td or Tdap) 12/22/2026   Colon Cancer Screening  05/11/2027   Pneumococcal Vaccination  Completed   Flu Shot  Completed   Hepatitis C Screening  Completed   HIV Screening  Completed   HPV Vaccine  Aged Out  *Topic was postponed. The date shown is not the original due date.   Labs today

## 2023-04-07 ENCOUNTER — Encounter: Payer: Self-pay | Admitting: Family Medicine

## 2023-04-07 ENCOUNTER — Other Ambulatory Visit (HOSPITAL_COMMUNITY): Payer: Self-pay

## 2023-04-07 LAB — COMPREHENSIVE METABOLIC PANEL
AG Ratio: 1.9 (calc) (ref 1.0–2.5)
ALT: 19 U/L (ref 6–29)
AST: 19 U/L (ref 10–35)
Albumin: 4.5 g/dL (ref 3.6–5.1)
Alkaline phosphatase (APISO): 50 U/L (ref 37–153)
BUN: 12 mg/dL (ref 7–25)
CO2: 25 mmol/L (ref 20–32)
Calcium: 10.1 mg/dL (ref 8.6–10.4)
Chloride: 104 mmol/L (ref 98–110)
Creat: 0.74 mg/dL (ref 0.50–1.03)
Globulin: 2.4 g/dL (ref 1.9–3.7)
Glucose, Bld: 99 mg/dL (ref 65–99)
Potassium: 3.8 mmol/L (ref 3.5–5.3)
Sodium: 140 mmol/L (ref 135–146)
Total Bilirubin: 0.3 mg/dL (ref 0.2–1.2)
Total Protein: 6.9 g/dL (ref 6.1–8.1)

## 2023-04-07 LAB — CBC WITH DIFFERENTIAL/PLATELET
Absolute Lymphocytes: 1977 {cells}/uL (ref 850–3900)
Absolute Monocytes: 401 {cells}/uL (ref 200–950)
Basophils Absolute: 18 {cells}/uL (ref 0–200)
Basophils Relative: 0.3 %
Eosinophils Absolute: 212 {cells}/uL (ref 15–500)
Eosinophils Relative: 3.6 %
HCT: 46.3 % — ABNORMAL HIGH (ref 35.0–45.0)
Hemoglobin: 15.2 g/dL (ref 11.7–15.5)
MCH: 27.8 pg (ref 27.0–33.0)
MCHC: 32.8 g/dL (ref 32.0–36.0)
MCV: 84.8 fL (ref 80.0–100.0)
MPV: 13 fL — ABNORMAL HIGH (ref 7.5–12.5)
Monocytes Relative: 6.8 %
Neutro Abs: 3292 {cells}/uL (ref 1500–7800)
Neutrophils Relative %: 55.8 %
Platelets: 222 10*3/uL (ref 140–400)
RBC: 5.46 10*6/uL — ABNORMAL HIGH (ref 3.80–5.10)
RDW: 13.5 % (ref 11.0–15.0)
Total Lymphocyte: 33.5 %
WBC: 5.9 10*3/uL (ref 3.8–10.8)

## 2023-04-07 LAB — HEMOGLOBIN A1C
Hgb A1c MFr Bld: 6.2 %{Hb} — ABNORMAL HIGH (ref <5.7)
Mean Plasma Glucose: 131 mg/dL
eAG (mmol/L): 7.3 mmol/L

## 2023-04-07 LAB — TSH: TSH: 0.55 m[IU]/L

## 2023-04-07 LAB — VITAMIN B12: Vitamin B-12: 511 pg/mL (ref 200–1100)

## 2023-04-07 LAB — LIPID PANEL
Cholesterol: 143 mg/dL (ref <200)
HDL: 48 mg/dL — ABNORMAL LOW (ref >=50)
LDL Cholesterol (Calc): 73 mg/dL
Non-HDL Cholesterol (Calc): 95 mg/dL (ref <130)
Total CHOL/HDL Ratio: 3 (calc) (ref <5.0)
Triglycerides: 139 mg/dL (ref <150)

## 2023-04-07 LAB — VITAMIN D 25 HYDROXY (VIT D DEFICIENCY, FRACTURES): Vit D, 25-Hydroxy: 29 ng/mL — ABNORMAL LOW (ref 30–100)

## 2023-04-08 ENCOUNTER — Other Ambulatory Visit (HOSPITAL_COMMUNITY): Payer: Self-pay

## 2023-04-15 ENCOUNTER — Other Ambulatory Visit (HOSPITAL_COMMUNITY): Payer: Self-pay

## 2023-04-15 MED ORDER — HYDROCODONE-ACETAMINOPHEN 10-325 MG PO TABS
1.0000 | ORAL_TABLET | Freq: Four times a day (QID) | ORAL | 0 refills | Status: DC | PRN
  Filled 2023-04-15 – 2023-05-06 (×2): qty 120, 30d supply, fill #0

## 2023-04-15 MED ORDER — HYDROCODONE-ACETAMINOPHEN 10-325 MG PO TABS
1.0000 | ORAL_TABLET | Freq: Four times a day (QID) | ORAL | 0 refills | Status: DC | PRN
  Filled 2023-06-03: qty 120, 30d supply, fill #0

## 2023-05-06 ENCOUNTER — Other Ambulatory Visit (HOSPITAL_COMMUNITY): Payer: Self-pay

## 2023-06-03 ENCOUNTER — Other Ambulatory Visit (HOSPITAL_COMMUNITY): Payer: Self-pay

## 2023-06-10 ENCOUNTER — Other Ambulatory Visit (HOSPITAL_COMMUNITY): Payer: Self-pay

## 2023-06-10 MED ORDER — CYCLOBENZAPRINE HCL 10 MG PO TABS
10.0000 mg | ORAL_TABLET | Freq: Three times a day (TID) | ORAL | 3 refills | Status: DC
  Filled 2023-06-10: qty 90, 30d supply, fill #0

## 2023-06-10 MED ORDER — CLONAZEPAM 0.5 MG PO TABS
0.5000 mg | ORAL_TABLET | Freq: Every evening | ORAL | 3 refills | Status: DC | PRN
  Filled 2023-07-05: qty 28, 28d supply, fill #0
  Filled 2023-07-05: qty 2, 2d supply, fill #0
  Filled 2023-08-04: qty 30, 30d supply, fill #1
  Filled 2023-09-01 (×2): qty 30, 30d supply, fill #2

## 2023-06-10 MED ORDER — HYDROCODONE-ACETAMINOPHEN 10-325 MG PO TABS
1.0000 | ORAL_TABLET | Freq: Four times a day (QID) | ORAL | 0 refills | Status: DC | PRN
  Filled 2023-08-04: qty 120, 30d supply, fill #0

## 2023-06-10 MED ORDER — HYDROCODONE-ACETAMINOPHEN 10-325 MG PO TABS
1.0000 | ORAL_TABLET | Freq: Four times a day (QID) | ORAL | 0 refills | Status: DC | PRN
  Filled 2023-07-05: qty 120, 30d supply, fill #0

## 2023-07-05 ENCOUNTER — Other Ambulatory Visit (HOSPITAL_COMMUNITY): Payer: Self-pay

## 2023-08-04 ENCOUNTER — Other Ambulatory Visit (HOSPITAL_COMMUNITY): Payer: Self-pay

## 2023-08-05 ENCOUNTER — Other Ambulatory Visit (HOSPITAL_COMMUNITY): Payer: Self-pay

## 2023-08-05 MED ORDER — HYDROCODONE-ACETAMINOPHEN 10-325 MG PO TABS
1.0000 | ORAL_TABLET | Freq: Four times a day (QID) | ORAL | 0 refills | Status: DC | PRN
  Filled 2023-09-01 (×2): qty 120, 30d supply, fill #0

## 2023-08-27 ENCOUNTER — Other Ambulatory Visit (HOSPITAL_COMMUNITY): Payer: Self-pay

## 2023-09-01 ENCOUNTER — Other Ambulatory Visit: Payer: Self-pay

## 2023-09-01 ENCOUNTER — Other Ambulatory Visit (HOSPITAL_COMMUNITY): Payer: Self-pay

## 2023-09-08 ENCOUNTER — Emergency Department (HOSPITAL_BASED_OUTPATIENT_CLINIC_OR_DEPARTMENT_OTHER)
Admission: EM | Admit: 2023-09-08 | Discharge: 2023-09-08 | Disposition: A | Discharge status: Home / Self Care | Attending: Emergency Medicine | Admitting: Emergency Medicine

## 2023-09-08 ENCOUNTER — Other Ambulatory Visit: Payer: Self-pay

## 2023-09-08 DIAGNOSIS — S8992XA Unspecified injury of left lower leg, initial encounter: Secondary | ICD-10-CM | POA: Diagnosis present

## 2023-09-08 DIAGNOSIS — S80862A Insect bite (nonvenomous), left lower leg, initial encounter: Secondary | ICD-10-CM | POA: Diagnosis not present

## 2023-09-08 DIAGNOSIS — W57XXXA Bitten or stung by nonvenomous insect and other nonvenomous arthropods, initial encounter: Secondary | ICD-10-CM | POA: Insufficient documentation

## 2023-09-08 DIAGNOSIS — S81802A Unspecified open wound, left lower leg, initial encounter: Secondary | ICD-10-CM

## 2023-09-08 DIAGNOSIS — Z85828 Personal history of other malignant neoplasm of skin: Secondary | ICD-10-CM | POA: Insufficient documentation

## 2023-09-08 DIAGNOSIS — Z79899 Other long term (current) drug therapy: Secondary | ICD-10-CM | POA: Diagnosis not present

## 2023-09-08 MED ORDER — CEPHALEXIN 250 MG PO CAPS
500.0000 mg | ORAL_CAPSULE | Freq: Once | ORAL | Status: AC
  Administered 2023-09-08: 500 mg via ORAL
  Filled 2023-09-08: qty 2

## 2023-09-08 MED ORDER — CEPHALEXIN 500 MG PO CAPS: 500.0000 mg | ORAL_CAPSULE | Freq: Four times a day (QID) | ORAL | 0 refills | Status: DC

## 2023-09-08 NOTE — Discharge Instructions (Signed)
 Evaluation today revealed that you may have a skin infection but cannot definitively rule out a skin cancer of some kind.  Please follow-up with dermatology.  Sent Keflex  to your pharmacy to treat for associated infection.  Please also follow-up with your PCP as well.

## 2023-09-08 NOTE — ED Triage Notes (Signed)
 Patient states wound to left leg she believes was a spider bite x1 month. Now reports redness around area and concerned for infection

## 2023-09-08 NOTE — ED Provider Notes (Signed)
 [place] EMERGENCY DEPARTMENT AT [facility] Provider Note   CSN: [id number] Arrival date & time: 09/08/23  1352     Patient presents with: Insect Bite  HPI Desiree Walters is a 53 y.o. female with reported history of basal cell carcinoma on her back presenting for concern for spider bite.  She states it occurred a month ago.  She states she cannot recall being bitten by a bug but woke up the next morning and noticed a spot on the outer portion of her lower left leg.  She states it has been somewhat more red and tender.  Denies oozing bleeding or swelling.  Denies fever and chills.  She does report that she has had basal cell carcinoma in her back that required removal.   HPI     Prior to Admission medications   Medication Sig Start Date End Date Taking? Authorizing Provider  cephALEXin  (KEFLEX ) 500 MG capsule Take 1 capsule (500 mg total) by mouth 4 (four) times daily. 09/08/23  Yes Atilano Covelli K, PA-C  clonazePAM  (KLONOPIN ) 0.5 MG tablet Take 1 tablet by mouth at bedtime. 12/14/12   [provider]  clonazePAM  (KLONOPIN ) 0.5 MG tablet Take 1 tablet (0.5 mg total) by mouth every evening as needed 07/07/22     clonazePAM  (KLONOPIN ) 0.5 MG tablet Take 1 tablet (0.5 mg) by mouth at bedtime as needed. 10/27/22     clonazePAM  (KLONOPIN ) 0.5 MG tablet Take 1 tablet (0.5 mg total) by mouth at bedtime as needed. 06/10/23     cyanocobalamin  (VITAMIN B12) 1000 MCG/ML injection INJECT 1 ML INTO THE MUSCLE ONCE A MONTH 04/06/23   Tower, Laine DELENA, MD  cyclobenzaprine  (FLEXERIL ) 10 MG tablet Take 1 tablet by mouth 3 (three) times daily as needed. 06/25/15   [provider]  cyclobenzaprine  (FLEXERIL ) 10 MG tablet Take 1 tablet (10 mg total) by mouth 3 (three) times daily. 07/07/22     cyclobenzaprine  (FLEXERIL ) 10 MG tablet Take 1 tablet by mouth 3 times a day 10/27/22     cyclobenzaprine  (FLEXERIL ) 10 MG tablet Take 1 tablet by mouth three times a day 02/16/23      cyclobenzaprine  (FLEXERIL ) 10 MG tablet Take 1 tablet by mouth three times a day 06/10/23     estradiol (ESTRACE) 0.1 MG/GM vaginal cream PLEASE SEE ATTACHED FOR DETAILED DIRECTIONS 02/18/21   [provider]  hydrochlorothiazide  (HYDRODIURIL ) 25 MG tablet Take 1 tablet (25 mg total) by mouth daily. 04/06/23   Tower, Laine DELENA, MD  HYDROcodone -acetaminophen  (NORCO) 10-325 MG per tablet Take 1 tablet by mouth every 4 (four) hours as needed.    [provider]  HYDROcodone -acetaminophen  (NORCO) 10-325 MG tablet Take 1 tablet by mouth every 6 (six) hours as needed for pain 12/29/21     HYDROcodone -acetaminophen  (NORCO) 10-325 MG tablet Take 1 tablet by mouth every six hours as needed for pain 01/22/22     HYDROcodone -acetaminophen  (NORCO) 10-325 MG tablet Take 1 tablet by mouth every six hours as needed for pain 02/19/22     HYDROcodone -acetaminophen  (NORCO) 10-325 MG tablet Take 1 tablet by mouth every 6 (six) hours as needed for pain 03/19/22     HYDROcodone -acetaminophen  (NORCO) 10-325 MG tablet Take 1 tablet by mouth every 6 hours as needed for pain (fill 04/16/22) 04/16/22     HYDROcodone -acetaminophen  (NORCO) 10-325 MG tablet Take 1 tablet by mouth every 6 (six) hours as needed for pain. 06/11/22     HYDROcodone -acetaminophen  (NORCO) 10-325 MG tablet Take 1 tablet  by mouth every 6 hours as needed for pain 05/14/22     HYDROcodone -acetaminophen  (NORCO) 10-325 MG tablet Take 1 tablet by mouth every 6 (six) hours as needed for pain 07/07/22     HYDROcodone -acetaminophen  (NORCO) 10-325 MG tablet Take 1 tablet by mouth every 6 (six) hours as needed for pain 08/04/22     HYDROcodone -acetaminophen  (NORCO) 10-325 MG tablet Take 1 tablet by mouth every 6 (six) hours as needed for pain. 09/01/22     HYDROcodone -acetaminophen  (NORCO) 10-325 MG tablet Take 1 tablet by mouth every 6 (six) hours as needed for pain. 09/29/22     HYDROcodone -acetaminophen  (NORCO) 10-325 MG tablet Take 1 tablet by mouth every 6  hours as needed for pain 10/27/22     HYDROcodone -acetaminophen  (NORCO) 10-325 MG tablet Take 1 tablet by mouth every 6 (six) hours as needed for pain 11/25/22     HYDROcodone -acetaminophen  (NORCO) 10-325 MG tablet Take 1 tablet by mouth every 6 (six) hours as needed for pain. 01/20/23     HYDROcodone -acetaminophen  (NORCO) 10-325 MG tablet Take 1 tablet by mouth every 6 (six) hours as needed for pain. 12/22/22     HYDROcodone -acetaminophen  (NORCO) 10-325 MG tablet Take 1 tablet by mouth every 6 (six) hours as needed for pain 02/16/23     HYDROcodone -acetaminophen  (NORCO) 10-325 MG tablet Take 1 tablet by mouth every 6 (six) hours as needed for pain 03/16/23     HYDROcodone -acetaminophen  (NORCO) 10-325 MG tablet Take 1 tablet by mouth every 6 (six) hours as needed for pain 05/14/23     HYDROcodone -acetaminophen  (NORCO) 10-325 MG tablet Take 1 tablet by mouth every 6 (six) hours as needed. 04/15/23     HYDROcodone -acetaminophen  (NORCO) 10-325 MG tablet Take 1 tablet by mouth every 6 (six) hours as needed for pain 06/10/23     HYDROcodone -acetaminophen  (NORCO) 10-325 MG tablet Take 1 tablet by mouth every 6 (six) hours as needed for pain 07/09/23     HYDROcodone -acetaminophen  (NORCO) 10-325 MG tablet Take 1 tablet by mouth every 6 (six) hours as needed for pain. 09/01/23     ISOtretinoin (ACCUTANE PO) Take by mouth.    [provider]  metoprolol  succinate (TOPROL -XL) 25 MG 24 hr tablet Take 1 tablet (25 mg total) by mouth daily. 04/06/23   Tower, Laine LABOR, MD  naloxone  (NARCAN ) nasal spray 4 mg/0.1 mL USe 1 spray into one nostril single dose as needed may repeat dose every 2-3 minutes until patient responsive or EMS arrives 12/22/22     rosuvastatin  (CRESTOR ) 10 MG tablet Take 1 tablet (10 mg total) by mouth daily. 04/06/23   Tower, Laine LABOR, MD  trimethoprim (TRIMPEX) 100 MG tablet Take 100 mg by mouth daily.    [provider]    Allergies: Ciprofloxacin, Levofloxacin, Quinolones, and  Varenicline  tartrate    Review of Systems See HPI  Updated Vital Signs BP (!) 188/115 (BP Location: Right Arm)   Pulse 89   Temp 98.3 F (36.8 C)   Resp 17   SpO2 99%   Physical Exam Constitutional:      Appearance: Normal appearance.  HENT:     Head: Normocephalic.     Nose: Nose normal.   Eyes:     Conjunctiva/sclera: Conjunctivae normal.   Pulmonary:     Effort: Pulmonary effort is normal.   Skin:    Comments: See pic below   Neurological:     Mental Status: She is alert.   Psychiatric:        Mood  and Affect: Mood normal.     (all labs ordered are listed, but only abnormal results are displayed) Labs Reviewed - No data to display  EKG: None  Radiology: No results found.   Procedures   Medications Ordered in the ED  cephALEXin  (KEFLEX ) capsule 500 mg (has no administration in time range)                                    Medical Decision Making  52 year old well-appearing female presenting for leg wound.  Exam notable for what could be the insect bite but cannot definitively rule out skin cancer of some kind.  Exam findings do not suggest underlying abscess but will treat for mild cellulitis with Keflex  but did advise her ultimately that she needs to follow-up with dermatology, also advised her to follow-up with her PCP.  Sent Keflex  to her pharmacy.  Discharged good condition.     Final diagnoses:  Wound of left lower extremity, initial encounter    ED Discharge Orders          Ordered    cephALEXin  (KEFLEX ) 500 MG capsule  4 times daily        09/08/23 1535               Ireoluwa Grant K, PA-C 09/08/23 1536    Ruthe Cornet, DO 09/08/23 1541

## 2023-09-30 ENCOUNTER — Other Ambulatory Visit (HOSPITAL_COMMUNITY): Payer: Self-pay

## 2023-09-30 MED ORDER — CLONAZEPAM 0.5 MG PO TABS
0.5000 mg | ORAL_TABLET | Freq: Every evening | ORAL | 3 refills | Status: DC
  Filled 2023-09-30: qty 30, 30d supply, fill #0
  Filled 2023-11-02: qty 30, 30d supply, fill #1
  Filled 2023-12-02: qty 30, 30d supply, fill #2
  Filled 2023-12-30: qty 30, 30d supply, fill #3

## 2023-09-30 MED ORDER — HYDROCODONE-ACETAMINOPHEN 10-325 MG PO TABS
1.0000 | ORAL_TABLET | Freq: Four times a day (QID) | ORAL | 0 refills | Status: DC | PRN
  Filled 2023-09-30: qty 120, 30d supply, fill #0

## 2023-09-30 MED ORDER — CYCLOBENZAPRINE HCL 10 MG PO TABS
10.0000 mg | ORAL_TABLET | Freq: Three times a day (TID) | ORAL | 3 refills | Status: AC
  Filled 2023-09-30: qty 90, 30d supply, fill #0

## 2023-09-30 MED ORDER — HYDROCODONE-ACETAMINOPHEN 10-325 MG PO TABS
1.0000 | ORAL_TABLET | Freq: Four times a day (QID) | ORAL | 0 refills | Status: DC | PRN
  Filled 2023-11-02: qty 120, 30d supply, fill #0

## 2023-11-02 ENCOUNTER — Other Ambulatory Visit (HOSPITAL_COMMUNITY): Payer: Self-pay

## 2023-11-30 ENCOUNTER — Other Ambulatory Visit (HOSPITAL_COMMUNITY): Payer: Self-pay

## 2023-11-30 MED ORDER — HYDROCODONE-ACETAMINOPHEN 10-325 MG PO TABS
1.0000 | ORAL_TABLET | Freq: Four times a day (QID) | ORAL | 0 refills | Status: DC | PRN
  Filled 2023-12-29: qty 120, 30d supply, fill #0

## 2023-11-30 MED ORDER — HYDROCODONE-ACETAMINOPHEN 10-325 MG PO TABS
1.0000 | ORAL_TABLET | Freq: Four times a day (QID) | ORAL | 0 refills | Status: DC | PRN
  Filled 2023-11-30: qty 120, 30d supply, fill #0

## 2023-11-30 MED ORDER — NALOXONE HCL 4 MG/0.1ML NA LIQD
1.0000 | Freq: Every day | NASAL | 1 refills | Status: AC
  Filled 2023-11-30: qty 2, 2d supply, fill #0

## 2023-12-02 ENCOUNTER — Other Ambulatory Visit (HOSPITAL_COMMUNITY): Payer: Self-pay

## 2023-12-29 ENCOUNTER — Other Ambulatory Visit: Payer: Self-pay

## 2023-12-29 ENCOUNTER — Other Ambulatory Visit (HOSPITAL_COMMUNITY): Payer: Self-pay

## 2023-12-30 ENCOUNTER — Other Ambulatory Visit (HOSPITAL_COMMUNITY): Payer: Self-pay

## 2023-12-31 ENCOUNTER — Other Ambulatory Visit: Payer: Self-pay

## 2024-01-25 ENCOUNTER — Other Ambulatory Visit (HOSPITAL_COMMUNITY): Payer: Self-pay

## 2024-01-25 MED ORDER — HYDROCODONE-ACETAMINOPHEN 10-325 MG PO TABS
1.0000 | ORAL_TABLET | Freq: Four times a day (QID) | ORAL | 0 refills | Status: AC | PRN
  Filled 2024-01-25 – 2024-01-28 (×2): qty 120, 30d supply, fill #0

## 2024-01-25 MED ORDER — HYDROCODONE-ACETAMINOPHEN 10-325 MG PO TABS
1.0000 | ORAL_TABLET | Freq: Four times a day (QID) | ORAL | 0 refills | Status: DC | PRN
  Filled 2024-03-01: qty 120, 30d supply, fill #0

## 2024-01-25 MED ORDER — CLONAZEPAM 0.5 MG PO TABS
0.5000 mg | ORAL_TABLET | Freq: Every evening | ORAL | 3 refills | Status: AC
  Filled 2024-01-25 – 2024-02-03 (×3): qty 30, 30d supply, fill #0
  Filled 2024-03-02: qty 30, 30d supply, fill #1
  Filled 2024-03-31: qty 30, 30d supply, fill #2

## 2024-01-28 ENCOUNTER — Other Ambulatory Visit (HOSPITAL_COMMUNITY): Payer: Self-pay

## 2024-01-29 ENCOUNTER — Other Ambulatory Visit (HOSPITAL_COMMUNITY): Payer: Self-pay

## 2024-02-03 ENCOUNTER — Other Ambulatory Visit (HOSPITAL_COMMUNITY): Payer: Self-pay

## 2024-03-01 ENCOUNTER — Other Ambulatory Visit (HOSPITAL_COMMUNITY): Payer: Self-pay

## 2024-03-02 ENCOUNTER — Other Ambulatory Visit: Payer: Self-pay

## 2024-03-02 ENCOUNTER — Other Ambulatory Visit (HOSPITAL_COMMUNITY): Payer: Self-pay

## 2024-03-23 ENCOUNTER — Other Ambulatory Visit (HOSPITAL_COMMUNITY): Payer: Self-pay

## 2024-03-23 MED ORDER — LIDOCAINE 5 % EX PTCH
MEDICATED_PATCH | CUTANEOUS | 2 refills | Status: AC
  Filled 2024-03-23: qty 90, 30d supply, fill #0

## 2024-03-23 MED ORDER — HYDROCODONE-ACETAMINOPHEN 10-325 MG PO TABS: 1.0000 | ORAL_TABLET | Freq: Four times a day (QID) | ORAL | 0 refills | Status: AC | PRN

## 2024-03-23 MED ORDER — HYDROCODONE-ACETAMINOPHEN 10-325 MG PO TABS
1.0000 | ORAL_TABLET | Freq: Four times a day (QID) | ORAL | 0 refills | Status: AC | PRN
  Filled 2024-03-31: qty 120, 30d supply, fill #0

## 2024-03-24 ENCOUNTER — Other Ambulatory Visit (HOSPITAL_COMMUNITY): Payer: Self-pay

## 2024-03-31 ENCOUNTER — Other Ambulatory Visit (HOSPITAL_COMMUNITY): Payer: Self-pay

## 2024-04-01 ENCOUNTER — Other Ambulatory Visit (HOSPITAL_COMMUNITY): Payer: Self-pay

## 2024-04-04 ENCOUNTER — Other Ambulatory Visit (HOSPITAL_COMMUNITY): Payer: Self-pay

## 2024-04-05 ENCOUNTER — Other Ambulatory Visit: Payer: Self-pay

## 2024-04-06 ENCOUNTER — Other Ambulatory Visit: Payer: Self-pay | Admitting: Family Medicine

## 2024-04-11 ENCOUNTER — Encounter: Payer: 59 | Admitting: Family Medicine

## 2024-04-13 ENCOUNTER — Ambulatory Visit: Admitting: Family Medicine

## 2024-04-13 ENCOUNTER — Encounter: Payer: Self-pay | Admitting: Family Medicine

## 2024-04-13 VITALS — BP 128/70 | HR 70 | Temp 98.2°F | Ht 64.0 in | Wt 165.2 lb

## 2024-04-13 DIAGNOSIS — E559 Vitamin D deficiency, unspecified: Secondary | ICD-10-CM | POA: Diagnosis not present

## 2024-04-13 DIAGNOSIS — F341 Dysthymic disorder: Secondary | ICD-10-CM

## 2024-04-13 DIAGNOSIS — F17200 Nicotine dependence, unspecified, uncomplicated: Secondary | ICD-10-CM

## 2024-04-13 DIAGNOSIS — Z1231 Encounter for screening mammogram for malignant neoplasm of breast: Secondary | ICD-10-CM | POA: Diagnosis not present

## 2024-04-13 DIAGNOSIS — E876 Hypokalemia: Secondary | ICD-10-CM | POA: Diagnosis not present

## 2024-04-13 DIAGNOSIS — I10 Essential (primary) hypertension: Secondary | ICD-10-CM | POA: Diagnosis not present

## 2024-04-13 DIAGNOSIS — Z122 Encounter for screening for malignant neoplasm of respiratory organs: Secondary | ICD-10-CM | POA: Diagnosis not present

## 2024-04-13 DIAGNOSIS — R7303 Prediabetes: Secondary | ICD-10-CM

## 2024-04-13 DIAGNOSIS — E538 Deficiency of other specified B group vitamins: Secondary | ICD-10-CM

## 2024-04-13 DIAGNOSIS — E7800 Pure hypercholesterolemia, unspecified: Secondary | ICD-10-CM

## 2024-04-13 DIAGNOSIS — Z85828 Personal history of other malignant neoplasm of skin: Secondary | ICD-10-CM | POA: Diagnosis not present

## 2024-04-13 DIAGNOSIS — Z0000 Encounter for general adult medical examination without abnormal findings: Secondary | ICD-10-CM

## 2024-04-13 NOTE — Assessment & Plan Note (Signed)
 A1c ordered disc imp of low glycemic diet and wt loss to prevent DM2  Orders:   Hemoglobin A1c

## 2024-04-13 NOTE — Assessment & Plan Note (Signed)
 Disc goals for lipids and reasons to control them Rev last labs with pt Rev low sat fat diet in detail Labs today  Continues crestor  10 mg daily and diet  Orders:   Lipid Panel   Comprehensive metabolic panel with GFR

## 2024-04-13 NOTE — Assessment & Plan Note (Addendum)
 bp in fair control at this time  BP Readings from Last 1 Encounters:  04/13/24 128/70   No changes needed Most recent labs reviewed  Disc lifstyle change with low sodium diet and exercise  Plan to continue hctz 25 mg daily and metoprolol  xl 50 mg daily   Lab today  Orders:   TSH   Lipid Panel   CBC with Differential/Platelet   Comprehensive metabolic panel with GFR

## 2024-04-13 NOTE — Assessment & Plan Note (Signed)
 Both basal and squamous cell  Sees derm regularly  Using sun protection

## 2024-04-13 NOTE — Assessment & Plan Note (Signed)
 At least 33 pack years Not ready to quit Ref for lung cancer screening program  Orders:   Ambulatory Referral Lung Cancer Screening  Pulmonary

## 2024-04-13 NOTE — Assessment & Plan Note (Addendum)
 D level today  Taking mvi and ca plus D  Orders:   VITAMIN D  25 Hydroxy (Vit-D Deficiency, Fractures)

## 2024-04-13 NOTE — Assessment & Plan Note (Signed)
 Disc in detail risks of smoking and possible outcomes including copd, vascular/ heart disease, cancer , respiratory and sinus infections as well as osteoporosis  Pt voices understanding Not yet ready to quit Referred for lung cancer screening program  Orders:   Ambulatory Referral Lung Cancer Screening [facility]

## 2024-04-13 NOTE — Assessment & Plan Note (Signed)
 B12 level today  Monthly shots - does miss some  Orders:   Vitamin B12

## 2024-04-13 NOTE — Assessment & Plan Note (Signed)
 Reviewed health habits including diet and exercise and skin cancer prevention Reviewed appropriate screening tests for age  Also reviewed health mt list, fam hx and immunization status , as well as social and family history   See HPI Labs reviewed and ordered Health Maintenance  Topic Date Due   Hepatitis B Vaccine (1 of 3 - 19+ 3-dose series) Never done   Breast Cancer Screening  07/31/2023   Flu Shot  06/13/2024*   COVID-19 Vaccine (4 - 2025-26 season) 04/29/2025*   Zoster (Shingles) Vaccine (1 of 2) 07/12/2025*   DTaP/Tdap/Td vaccine (3 - Td or Tdap) 12/22/2026   Colon Cancer Screening  05/11/2027   Pneumococcal Vaccine for age over 49  Completed   HPV Vaccine (No Doses Required) Completed   Hepatitis C Screening  Completed   HIV Screening  Completed   Meningitis B Vaccine  Aged Out  *Topic was postponed. The date shown is not the original due date.    Mammogram ordered Pt considering shingrix  Discussed fall prevention, supplements and exercise for bone density  Lab today  Utd derm care/using sun protection Counseled on smoking cessation and ref for lung cancer screen program  PHQ 7 in setting of stressors/ is in mental health counseling

## 2024-04-13 NOTE — Assessment & Plan Note (Signed)
 Reviewed PHQ Dealing with family mental health issues and difficult for her  In counseling-encouraged her to continue

## 2024-04-13 NOTE — Patient Instructions (Addendum)
 If you are interested in the shingles vaccine series (Shingrix), call your insurance or pharmacy to check on coverage and location it must be given.  If affordable - you can schedule it here or at your pharmacy depending on coverage    Get back to exercise  Use your peleton if you can  Add some strength training to your routine, this is important for bone and brain health and can reduce your risk of falls and help your body use insulin properly and regulate weight  Light weights, exercise bands , and internet videos are a good way to start  Yoga (chair or regular), machines , floor exercises or a gym with machines are also good options     I put the referral in for the lung cancer screening program Please let us  know if you don't hear in 1-2 weeks to set that up (mychart message or call or letter)   Keep thinking about quitting smoking      I would like to get a bone density test (early) due to your risk factors  Please check with your insurance and see if covered  Tell them you are post menopausal and a smoker  Let me know      You have an order for:  [x]   3D Mammogram  []   Bone Density     Please call for appointment:   []   [facility]  [address] [place] [address]  [phone]  []   [facility])   [address]  [place], [place] [address]  [phone]  []   [facility]      [address] [place], [place]        [phone]         []   [facility]  [address] [place], [place]  [phone]  []  [facility]   [address]   [place], [place] [address]  [phone]  []  [facility]  [address] [place], [place] [address] [phone]  []  [facility]   [address] First floor,  [address]   [place], [place]. [address]  [phone]  []  [facility]   [address],    [place], [place] [address]  [phone]      Make sure to wear two piece clothing  No lotions powders or deodorants the day of the appointment Make sure to bring picture ID and insurance card.  Bring list of medications you are currently taking including any supplements.   Schedule your screening mammogram through MyChart!   Select [facility] imaging sites can now be scheduled through MyChart.  Log into your MyChart account.  Go to Visit (or Appointments if  on mobile App) --> Schedule an  Appointment  Under Select a Reason for Visit choose the Mammogram  Screening option.  Complete the pre-visit questions  and select the time and place that  best fits your schedule

## 2024-04-13 NOTE — Assessment & Plan Note (Signed)
 Mammogram ordered.  Orders:   MM 3D SCREENING MAMMOGRAM BILATERAL BREAST; Future

## 2024-04-13 NOTE — Progress Notes (Signed)
 "  Subjective:    Patient ID: Desiree Walters, female    DOB: [birth year], 54 y.o.   MRN: [id number]  HPI  Here for health maintenance exam and to review chronic medical problems   Wt Readings from Last 3 Encounters:  04/13/24 165 lb 4 oz (75 kg)  04/06/23 169 lb 4 oz (76.8 kg)  04/03/22 158 lb 4 oz (71.8 kg)   28.37 kg/m  Vitals:   04/13/24 1117  BP: 128/70  Pulse: 70  Temp: 98.2 F (36.8 C)  SpO2: 100%    Immunization History  Administered Date(s) Administered   Influenza Split 11/28/2012, 12/14/2013   Influenza Whole 12/15/2006   Influenza, Seasonal, Injecte, Preservative Fre 04/06/2023   Influenza,inj,Quad PF,6+ Mos 12/21/2016, 02/18/2018, 12/16/2018, 03/25/2021, 04/03/2022   Influenza-Unspecified 11/16/2014, 12/31/2019   PFIZER(Purple Top)SARS-COV-2 Vaccination 03/31/2019, 05/01/2019, 12/31/2019   PNEUMOCOCCAL CONJUGATE-20 04/06/2023   Td 07/29/2007   Tdap 12/21/2016    Health Maintenance Due  Topic Date Due   Hepatitis B Vaccines 19-59 Average Risk (1 of 3 - 19+ 3-dose series) Never done   Mammogram  07/31/2023   Shingrix- may be interested   Mammogram 2024 Ordered  Self breast exam-no lumps   Gyn health S/p hysterectomy  Does have hot flashes  Uses estrogen vaginal cream for urinary health from urology    Colon cancer screening  Colonoscopy 04/2020- 7 year recall   Bone health   Falls-none  Fractures-none  Supplements  c  with D, also mvi  Last vitamin D  Lab Results  Component Value Date   VD25OH 29 (L) 04/06/2023    Exercise  None right now with shoulder problem - did go to PT   Has a peleton   Derm care Had 2 skin cancers recently  Squamous , basal  Has screening appointment soon   Uses sun protection now (not as a child)   Smoking status  1 ppd  Not ready to quit -but getting closer  At least 33 pack years   Intol of chantix      Mood    04/13/2024   11:30 AM 04/03/2022    5:15 PM 03/25/2021   12:53 PM 03/06/2020    10:53 AM 03/03/2019    9:31 AM  Depression screen PHQ 2/9  Decreased Interest 1 0 0 1 0  Down, Depressed, Hopeless 1 1 0 0 2  PHQ - 2 Score 2 1 0 1 2  Altered sleeping 1 2 2 1 1   Tired, decreased energy 1 2 1 2  0  Change in appetite 1 1 1 2  0  Feeling bad or failure about yourself  0 0 0 0 0  Trouble concentrating 1 1 2 3  0  Moving slowly or fidgety/restless 0 0 0 0 0  Suicidal thoughts 0 0 0 0 0  PHQ-9 Score 6 7  6  9  3    Difficult doing work/chores Not difficult at all Somewhat difficult Not difficult at all Not difficult at all Not difficult at all     Data saved with a previous flowsheet row definition    HTN bp is stable today  No cp or palpitations or headaches or edema  No side effects to medicines  BP Readings from Last 3 Encounters:  04/13/24 128/70  09/08/23 (!) 175/89  04/06/23 132/85     Lab Results  Component Value Date   NA 140 04/06/2023   K 3.8 04/06/2023   CO2 25 04/06/2023   GLUCOSE 99 04/06/2023   BUN  12 04/06/2023   CREATININE 0.74 04/06/2023   CALCIUM  10.1 04/06/2023   GFR 92.01 07/12/2020   GFRNONAA >60 07/07/2020     B12 def Monthly shot usually, occational misses  Lab Results  Component Value Date   VITAMINB12 511 04/06/2023   Due for labs   Prediabetes Lab Results  Component Value Date   HGBA1C 6.2 (H) 04/06/2023   HGBA1C 5.9 (H) 04/03/2022   HGBA1C 6.0 (H) 03/25/2021   Hyperlipidemia Lab Results  Component Value Date   CHOL 143 04/06/2023   HDL 48 (L) 04/06/2023   LDLCALC 73 04/06/2023   LDLDIRECT 159.0 02/18/2018   TRIG 139 04/06/2023   CHOLHDL 3.0 04/06/2023    Chronic back pain  Sees pain mgmt    Topical est cream use Sees urology   Patient Active Problem List   Diagnosis Date Noted   History of skin cancer 04/13/2024   Screening for lung cancer 04/13/2024   Chronic back pain 04/06/2023   Routine general medical examination at a health care facility 03/25/2021   Recurrent UTI 07/12/2020   Hypokalemia  07/12/2020   Dysthymia 03/03/2019   Vitamin D  deficiency 03/03/2019   Screening mammogram, encounter for 02/18/2018   Migraine without aura 01/21/2017   Menopausal syndrome (hot flashes) 05/09/2014   Encounter for screening mammogram for breast cancer 05/09/2014   Vitamin B12 deficiency 10/09/2009   Hyperlipidemia 10/09/2009   Prediabetes 10/09/2009   HYPERSOMNIA 09/26/2007   Obstructive sleep apnea 08/20/2006   TOBACCO ABUSE 08/04/2006   Essential hypertension 08/04/2006   OBSESSIVE-COMPULSIVE DISORDER 07/26/2006   FIBROMYALGIA 07/26/2006   Past Medical History:  Diagnosis Date   Anxiety    Asthma    COPD (chronic obstructive pulmonary disease) (HCC)    Depression    Diabetes mellitus    Fatty liver    Fibromyalgia    Hyperlipemia    Hypersomnia    NPSG 08-25-07-1.2/hr   Hypertension    Obesity    OCD (obsessive compulsive disorder)    Osteoporosis    Pernicious anemia    Sleep apnea    uses CPAP   Tobacco abuse    Past Surgical History:  Procedure Laterality Date   ABDOMINAL HYSTERECTOMY     APPENDECTOMY     AUGMENTATION MAMMAPLASTY Bilateral    BREAST ENHANCEMENT SURGERY  2003   COSMETIC SURGERY     FOOT SURGERY  2005; 2008   FRACTURE SURGERY     IUD - [facility]  2008   KNEE SURGERY Left    NASAL SEPTUM SURGERY     Social History[1] Family History  Problem Relation Age of Onset   Hypertension Father    Coronary artery disease Father    Sleep apnea Father        with CPAP   Heart disease Father        CAD   Hyperlipidemia Father    Diabetes Father    Hypertension Mother    Hyperlipidemia Mother    Miscarriages / Stillbirths Mother    Hypertension Sister    Hyperlipidemia Other        aunts/uncles   Coronary artery disease Other        aunts/uncles   Stroke Maternal Grandmother    Early death Paternal Grandfather    Multiple sclerosis Neg Hx    Colon cancer Neg Hx    Pancreatic cancer Neg Hx    Esophageal cancer Neg Hx    Breast cancer Neg Hx     Colon polyps Neg  Hx    Stomach cancer Neg Hx    Rectal cancer Neg Hx    Allergies[2] Medications Ordered Prior to Encounter[3]  Review of Systems  Constitutional:  Positive for fatigue. Negative for activity change, appetite change, fever and unexpected weight change.  HENT:  Negative for congestion, ear pain, rhinorrhea, sinus pressure and sore throat.   Eyes:  Negative for pain, redness and visual disturbance.  Respiratory:  Negative for cough, shortness of breath and wheezing.   Cardiovascular:  Negative for chest pain and palpitations.  Gastrointestinal:  Negative for abdominal pain, blood in stool, constipation and diarrhea.  Endocrine: Negative for polydipsia and polyuria.  Genitourinary:  Negative for dysuria, frequency and urgency.  Musculoskeletal:  Negative for arthralgias, back pain and myalgias.  Skin:  Negative for pallor and rash.  Allergic/Immunologic: Negative for environmental allergies.  Neurological:  Negative for dizziness, syncope and headaches.  Hematological:  Negative for adenopathy. Does not bruise/bleed easily.  Psychiatric/Behavioral:  Negative for decreased concentration and dysphoric mood. The patient is nervous/anxious.        Family member with bipolar  Has been hard        Objective:   Physical Exam Constitutional:      General: She is not in acute distress.    Appearance: Normal appearance. She is well-developed. She is not ill-appearing or diaphoretic.     Comments: Overweight   HENT:     Head: Normocephalic and atraumatic.     Right Ear: Tympanic membrane, ear canal and external ear normal.     Left Ear: Tympanic membrane, ear canal and external ear normal.     Nose: Nose normal. No congestion.     Mouth/Throat:     Mouth: Mucous membranes are moist.     Pharynx: Oropharynx is clear. No posterior oropharyngeal erythema.  Eyes:     General: No scleral icterus.    Extraocular Movements: Extraocular movements intact.      Conjunctiva/sclera: Conjunctivae normal.     Pupils: Pupils are equal, round, and reactive to light.  Neck:     Thyroid : No thyromegaly.     Vascular: No carotid bruit or JVD.  Cardiovascular:     Rate and Rhythm: Normal rate and regular rhythm.     Pulses: Normal pulses.     Heart sounds: Normal heart sounds.     No gallop.  Pulmonary:     Effort: Pulmonary effort is normal. No respiratory distress.     Breath sounds: Normal breath sounds. No wheezing or rhonchi.     Comments: Diffusely distant bs  Chest:     Chest wall: No tenderness.  Abdominal:     General: Bowel sounds are normal. There is no distension or abdominal bruit.     Palpations: Abdomen is soft. There is no mass.     Tenderness: There is no abdominal tenderness.     Hernia: No hernia is present.  Genitourinary:    Comments: Breast exam: No mass, nodules, thickening, tenderness, bulging, retraction, inflamation, nipple discharge or skin changes noted.  No axillary or clavicular LA.     Musculoskeletal:        General: No tenderness. Normal range of motion.     Cervical back: Normal range of motion and neck supple. No rigidity. No muscular tenderness.     Right lower leg: No edema.     Left lower leg: No edema.     Comments: No kyphosis   Lymphadenopathy:     Cervical: No cervical  adenopathy.  Skin:    General: Skin is warm and dry.     Coloration: Skin is not pale.     Findings: No erythema or rash.     Comments: Solar lentigines diffusely Some aks   Neurological:     Mental Status: She is alert. Mental status is at baseline.     Cranial Nerves: No cranial nerve deficit.     Motor: No abnormal muscle tone.     Coordination: Coordination normal.     Gait: Gait normal.     Deep Tendon Reflexes: Reflexes are normal and symmetric. Reflexes normal.  Psychiatric:        Mood and Affect: Mood normal.        Cognition and Memory: Cognition and memory normal.     Comments: Candidly discusses symptoms and stressors              Assessment & Plan:   Assessment & Plan Essential hypertension bp in fair control at this time  BP Readings from Last 1 Encounters:  04/13/24 128/70   No changes needed Most recent labs reviewed  Disc lifstyle change with low sodium diet and exercise  Plan to continue hctz 25 mg daily and metoprolol  xl 50 mg daily   Lab today  Orders:   TSH   Lipid Panel   CBC with Differential/Platelet   Comprehensive metabolic panel with GFR  Pure hypercholesterolemia Disc goals for lipids and reasons to control them Rev last labs with pt Rev low sat fat diet in detail Labs today  Continues crestor  10 mg daily and diet  Orders:   Lipid Panel   Comprehensive metabolic panel with GFR   Prediabetes A1c ordered disc imp of low glycemic diet and wt loss to prevent DM2  Orders:   Hemoglobin A1c  Vitamin B12 deficiency B12 level today  Monthly shots - does miss some  Orders:   Vitamin B12  Vitamin D  deficiency D level today  Taking mvi and ca plus D  Orders:   VITAMIN D  25 Hydroxy (Vit-D Deficiency, Fractures)  TOBACCO ABUSE Disc in detail risks of smoking and possible outcomes including copd, vascular/ heart disease, cancer , respiratory and sinus infections as well as osteoporosis  Pt voices understanding Not yet ready to quit Referred for lung cancer screening program  Orders:   Ambulatory Referral Lung Cancer Screening [facility]  Screening mammogram, encounter for Mammogram ordered  Orders:   MM 3D SCREENING MAMMOGRAM BILATERAL BREAST; Future  History of skin cancer Both basal and squamous cell  Sees derm regularly  Using sun protection     Dysthymia Reviewed PHQ Dealing with family mental health issues and difficult for her  In counseling-encouraged her to continue     Screening for lung cancer At least 33 pack years Not ready to quit Ref for lung cancer screening program  Orders:   Ambulatory Referral Lung Cancer Screening  [facility]  Routine general medical examination at a health care facility Reviewed health habits including diet and exercise and skin cancer prevention Reviewed appropriate screening tests for age  Also reviewed health mt list, fam hx and immunization status , as well as social and family history   See HPI Labs reviewed and ordered Health Maintenance  Topic Date Due   Hepatitis B Vaccine (1 of 3 - 19+ 3-dose series) Never done   Breast Cancer Screening  07/31/2023   Flu Shot  06/13/2024*   COVID-19 Vaccine (4 - 2025-26 season) 04/29/2025*  Zoster (Shingles) Vaccine (1 of 2) 07/12/2025*   DTaP/Tdap/Td vaccine (3 - Td or Tdap) 12/22/2026   Colon Cancer Screening  05/11/2027   Pneumococcal Vaccine for age over 71  Completed   HPV Vaccine (No Doses Required) Completed   Hepatitis C Screening  Completed   HIV Screening  Completed   Meningitis B Vaccine  Aged Out  *Topic was postponed. The date shown is not the original due date.    Mammogram ordered Pt considering shingrix  Discussed fall prevention, supplements and exercise for bone density  Pt plans to see if dexa would be covered / high risk bone loss due to smoking  Lab today  Utd derm care/using sun protection Counseled on smoking cessation and ref for lung cancer screen program  PHQ 7 in setting of stressors/ is in mental health counseling            [1]  Social History Tobacco Use   Smoking status: Every Day    Current packs/day: 0.50    Average packs/day: 0.5 packs/day for 20.0 years (10.0 ttl pk-yrs)    Types: Cigarettes   Smokeless tobacco: Never  Vaping Use   Vaping status: Never Used  Substance Use Topics   Alcohol use: Not Currently   Drug use: No  [2]  Allergies Allergen Reactions   Ciprofloxacin     REACTION: reaction not known   Levofloxacin     REACTION: reaction not known   Quinolones     Other reaction(s): Unknown   Varenicline  Tartrate     REACTION: headache  [3]  Current  Outpatient Medications on File Prior to Visit  Medication Sig Dispense Refill   clonazePAM  (KLONOPIN ) 0.5 MG tablet Take 1 tablet by mouth at bedtime.     clonazePAM  (KLONOPIN ) 0.5 MG tablet Take 1 tablet (0.5 mg total) by mouth every evening as needed 30 tablet 3   cyanocobalamin  (VITAMIN B12) 1000 MCG/ML injection INJECT 1 ML INTO THE MUSCLE ONCE A MONTH 3 mL 3   cyclobenzaprine  (FLEXERIL ) 10 MG tablet Take 1 tablet by mouth 3 (three) times daily as needed.     cyclobenzaprine  (FLEXERIL ) 10 MG tablet Take 1 tablet by mouth three times a day 90 tablet 3   estradiol (ESTRACE) 0.1 MG/GM vaginal cream PLEASE SEE ATTACHED FOR DETAILED DIRECTIONS     hydrochlorothiazide  (HYDRODIURIL ) 25 MG tablet TAKE 1 TABLET (25 MG TOTAL) BY MOUTH DAILY. 90 tablet 0   HYDROcodone -acetaminophen  (NORCO) 10-325 MG per tablet Take 1 tablet by mouth every 4 (four) hours as needed.     HYDROcodone -acetaminophen  (NORCO) 10-325 MG tablet Take 1 tablet by mouth every 6 (six) hours as needed for pain 120 tablet 0   [START ON 04/21/2024] HYDROcodone -acetaminophen  (NORCO) 10-325 MG tablet Take 1 tablet by mouth every 6 (six) hours as needed for pain 04/21/24 120 tablet 0   HYDROcodone -acetaminophen  (NORCO) 10-325 MG tablet Take 1 tablet by mouth every 6 (six) hours as needed for pain 120 tablet 0   ISOtretinoin (ACCUTANE PO) Take by mouth.     lidocaine  (LIDODERM ) 5 % Apply 1 - 3 patches to skin as directed 90 patch 2   methenamine (HIPREX) 1 g tablet Take 1 g by mouth 2 (two) times daily with a meal.     metoprolol  succinate (TOPROL -XL) 25 MG 24 hr tablet TAKE 1 TABLET (25 MG TOTAL) BY MOUTH DAILY. 90 tablet 0   naloxone  (NARCAN ) nasal spray 4 mg/0.1 mL Use 1 spray into one nostril as needed may  repeat dose every 2 - 3 minutes until patient responsive or EMS arrives 1 each 1   rosuvastatin  (CRESTOR ) 10 MG tablet Take 1 tablet (10 mg total) by mouth daily. 90 tablet 3   No current facility-administered medications on file prior  to visit.   "

## 2024-04-13 NOTE — Assessment & Plan Note (Signed)
 Reviewed health habits including diet and exercise and skin cancer prevention Reviewed appropriate screening tests for age  Also reviewed health mt list, fam hx and immunization status , as well as social and family history   See HPI Labs reviewed and ordered Health Maintenance  Topic Date Due   Hepatitis B Vaccine (1 of 3 - 19+ 3-dose series) Never done   Breast Cancer Screening  07/31/2023   Flu Shot  06/13/2024*   COVID-19 Vaccine (4 - 2025-26 season) 04/29/2025*   Zoster (Shingles) Vaccine (1 of 2) 07/12/2025*   DTaP/Tdap/Td vaccine (3 - Td or Tdap) 12/22/2026   Colon Cancer Screening  05/11/2027   Pneumococcal Vaccine for age over 47  Completed   HPV Vaccine (No Doses Required) Completed   Hepatitis C Screening  Completed   HIV Screening  Completed   Meningitis B Vaccine  Aged Out  *Topic was postponed. The date shown is not the original due date.    Mammogram ordered Pt considering shingrix  Discussed fall prevention, supplements and exercise for bone density  Pt plans to see if dexa would be covered / high risk bone loss due to smoking  Lab today  Utd derm care/using sun protection Counseled on smoking cessation and ref for lung cancer screen program  PHQ 7 in setting of stressors/ is in mental health counseling

## 2024-04-14 LAB — LIPID PANEL
Cholesterol: 200 mg/dL — ABNORMAL HIGH
HDL: 54 mg/dL
LDL Cholesterol (Calc): 109 mg/dL — ABNORMAL HIGH
Non-HDL Cholesterol (Calc): 146 mg/dL — ABNORMAL HIGH
Total CHOL/HDL Ratio: 3.7 (calc)
Triglycerides: 243 mg/dL — ABNORMAL HIGH

## 2024-04-14 LAB — COMPREHENSIVE METABOLIC PANEL WITH GFR
AG Ratio: 2.2 (calc) (ref 1.0–2.5)
ALT: 20 U/L (ref 6–29)
AST: 17 U/L (ref 10–35)
Albumin: 4.8 g/dL (ref 3.6–5.1)
Alkaline phosphatase (APISO): 60 U/L (ref 37–153)
BUN: 10 mg/dL (ref 7–25)
CO2: 32 mmol/L (ref 20–32)
Calcium: 10.2 mg/dL (ref 8.6–10.4)
Chloride: 100 mmol/L (ref 98–110)
Creat: 0.63 mg/dL (ref 0.50–1.03)
Globulin: 2.2 g/dL (ref 1.9–3.7)
Glucose, Bld: 100 mg/dL — ABNORMAL HIGH (ref 65–99)
Potassium: 3.7 mmol/L (ref 3.5–5.3)
Sodium: 140 mmol/L (ref 135–146)
Total Bilirubin: 0.3 mg/dL (ref 0.2–1.2)
Total Protein: 7 g/dL (ref 6.1–8.1)
eGFR: 106 mL/min/{1.73_m2}

## 2024-04-14 LAB — CBC WITH DIFFERENTIAL/PLATELET
Absolute Lymphocytes: 2436 {cells}/uL (ref 850–3900)
Absolute Monocytes: 420 {cells}/uL (ref 200–950)
Basophils Absolute: 28 {cells}/uL (ref 0–200)
Basophils Relative: 0.4 %
Eosinophils Absolute: 140 {cells}/uL (ref 15–500)
Eosinophils Relative: 2 %
HCT: 48.5 % — ABNORMAL HIGH (ref 35.9–46.0)
Hemoglobin: 15.9 g/dL — ABNORMAL HIGH (ref 11.7–15.5)
MCH: 27.5 pg (ref 27.0–33.0)
MCHC: 32.8 g/dL (ref 31.6–35.4)
MCV: 83.9 fL (ref 81.4–101.7)
MPV: 13.3 fL — ABNORMAL HIGH (ref 7.5–12.5)
Monocytes Relative: 6 %
Neutro Abs: 3976 {cells}/uL (ref 1500–7800)
Neutrophils Relative %: 56.8 %
Platelets: 228 10*3/uL (ref 140–400)
RBC: 5.78 Million/uL — ABNORMAL HIGH (ref 3.80–5.10)
RDW: 13.7 % (ref 11.0–15.0)
Total Lymphocyte: 34.8 %
WBC: 7 10*3/uL (ref 3.8–10.8)

## 2024-04-14 LAB — TSH: TSH: 0.55 m[IU]/L

## 2024-04-14 LAB — HEMOGLOBIN A1C
Hgb A1c MFr Bld: 6.1 % — ABNORMAL HIGH
Mean Plasma Glucose: 128 mg/dL
eAG (mmol/L): 7.1 mmol/L

## 2024-04-14 LAB — VITAMIN B12: Vitamin B-12: 461 pg/mL (ref 200–1100)

## 2024-04-14 LAB — VITAMIN D 25 HYDROXY (VIT D DEFICIENCY, FRACTURES): Vit D, 25-Hydroxy: 32 ng/mL (ref 30–100)

## 2024-04-16 ENCOUNTER — Ambulatory Visit: Payer: Self-pay | Admitting: Family Medicine

## 2024-04-17 ENCOUNTER — Other Ambulatory Visit (HOSPITAL_COMMUNITY): Payer: Self-pay

## 2025-04-09 ENCOUNTER — Other Ambulatory Visit

## 2025-04-16 ENCOUNTER — Encounter: Admitting: Family Medicine
